# Patient Record
Sex: Male | Born: 1985 | Race: Black or African American | Hispanic: No | Marital: Married | Smoking: Never smoker
Health system: Southern US, Community
[De-identification: ages and names within clinical notes are randomized; demographics above are authoritative.]

## PROBLEM LIST (undated history)

## (undated) DIAGNOSIS — E785 Hyperlipidemia, unspecified: Secondary | ICD-10-CM

---

## 2021-09-03 ENCOUNTER — Emergency Department (HOSPITAL_BASED_OUTPATIENT_CLINIC_OR_DEPARTMENT_OTHER): Payer: BC Managed Care – PPO

## 2021-09-03 ENCOUNTER — Emergency Department (HOSPITAL_BASED_OUTPATIENT_CLINIC_OR_DEPARTMENT_OTHER)
Admission: EM | Admit: 2021-09-03 | Discharge: 2021-09-03 | Disposition: A | Discharge status: Home / Self Care | Payer: BC Managed Care – PPO | Source: Home / Self Care | Attending: Emergency Medicine | Admitting: Emergency Medicine

## 2021-09-03 ENCOUNTER — Other Ambulatory Visit: Payer: Self-pay

## 2021-09-03 ENCOUNTER — Encounter (HOSPITAL_BASED_OUTPATIENT_CLINIC_OR_DEPARTMENT_OTHER): Payer: Self-pay | Admitting: Emergency Medicine

## 2021-09-03 ENCOUNTER — Encounter (HOSPITAL_BASED_OUTPATIENT_CLINIC_OR_DEPARTMENT_OTHER): Payer: Self-pay | Admitting: *Deleted

## 2021-09-03 DIAGNOSIS — Z20822 Contact with and (suspected) exposure to covid-19: Secondary | ICD-10-CM | POA: Insufficient documentation

## 2021-09-03 DIAGNOSIS — M546 Pain in thoracic spine: Secondary | ICD-10-CM | POA: Insufficient documentation

## 2021-09-03 DIAGNOSIS — R0602 Shortness of breath: Secondary | ICD-10-CM | POA: Insufficient documentation

## 2021-09-03 DIAGNOSIS — R0781 Pleurodynia: Secondary | ICD-10-CM | POA: Insufficient documentation

## 2021-09-03 DIAGNOSIS — R0789 Other chest pain: Secondary | ICD-10-CM | POA: Insufficient documentation

## 2021-09-03 DIAGNOSIS — R109 Unspecified abdominal pain: Secondary | ICD-10-CM | POA: Insufficient documentation

## 2021-09-03 DIAGNOSIS — S24152A Other incomplete lesion at T2-T6 level of thoracic spinal cord, initial encounter: Secondary | ICD-10-CM | POA: Diagnosis not present

## 2021-09-03 DIAGNOSIS — R079 Chest pain, unspecified: Secondary | ICD-10-CM | POA: Insufficient documentation

## 2021-09-03 LAB — BASIC METABOLIC PANEL
Anion gap: 7 (ref 5–15)
Anion gap: 10 (ref 5–15)
BUN: 14 mg/dL (ref 6–20)
BUN: 14 mg/dL (ref 6–20)
CO2: 27 mmol/L (ref 22–32)
CO2: 25 mmol/L (ref 22–32)
Calcium: 9.3 mg/dL (ref 8.9–10.3)
Calcium: 9.4 mg/dL (ref 8.9–10.3)
Chloride: 105 mmol/L (ref 98–111)
Chloride: 103 mmol/L (ref 98–111)
Creatinine, Ser: 1.13 mg/dL (ref 0.61–1.24)
Creatinine, Ser: 1.17 mg/dL (ref 0.61–1.24)
GFR, Estimated: >60 mL/min (ref >60)
GFR, Estimated: >60 mL/min (ref >60)
Glucose, Bld: 105 mg/dL — ABNORMAL HIGH (ref 70–99)
Glucose, Bld: 104 mg/dL — ABNORMAL HIGH (ref 70–99)
Potassium: 3.8 mmol/L (ref 3.5–5.1)
Potassium: 4 mmol/L (ref 3.5–5.1)
Sodium: 139 mmol/L (ref 135–145)
Sodium: 138 mmol/L (ref 135–145)

## 2021-09-03 LAB — CBC WITH DIFFERENTIAL/PLATELET
Abs Immature Granulocytes: 0.01 10*3/uL (ref 0.00–0.07)
Basophils Absolute: 0.1 10*3/uL (ref 0.0–0.1)
Basophils Relative: 1 %
Eosinophils Absolute: 0 10*3/uL (ref 0.0–0.5)
Eosinophils Relative: 0 %
HCT: 39.3 % (ref 39.0–52.0)
Hemoglobin: 12.7 g/dL — ABNORMAL LOW (ref 13.0–17.0)
Immature Granulocytes: 0 %
Lymphocytes Relative: 22 %
Lymphs Abs: 1.8 10*3/uL (ref 0.7–4.0)
MCH: 30.5 pg (ref 26.0–34.0)
MCHC: 32.3 g/dL (ref 30.0–36.0)
MCV: 94.2 fL (ref 80.0–100.0)
Monocytes Absolute: 1.1 10*3/uL — ABNORMAL HIGH (ref 0.1–1.0)
Monocytes Relative: 13 %
Neutro Abs: 5.1 10*3/uL (ref 1.7–7.7)
Neutrophils Relative %: 64 %
Platelets: 386 10*3/uL (ref 150–400)
RBC: 4.17 MIL/uL — ABNORMAL LOW (ref 4.22–5.81)
RDW: 13.2 % (ref 11.5–15.5)
WBC: 8 10*3/uL (ref 4.0–10.5)
nRBC: 0 % (ref 0.0–0.2)

## 2021-09-03 LAB — CBC
HCT: 39.6 % (ref 39.0–52.0)
Hemoglobin: 12.7 g/dL — ABNORMAL LOW (ref 13.0–17.0)
MCH: 30.3 pg (ref 26.0–34.0)
MCHC: 32.1 g/dL (ref 30.0–36.0)
MCV: 94.5 fL (ref 80.0–100.0)
Platelets: 402 10*3/uL — ABNORMAL HIGH (ref 150–400)
RBC: 4.19 MIL/uL — ABNORMAL LOW (ref 4.22–5.81)
RDW: 13.5 % (ref 11.5–15.5)
WBC: 10.7 10*3/uL — ABNORMAL HIGH (ref 4.0–10.5)
nRBC: 0 % (ref 0.0–0.2)

## 2021-09-03 LAB — BRAIN NATRIURETIC PEPTIDE: B Natriuretic Peptide: 9.1 pg/mL (ref 0.0–100.0)

## 2021-09-03 LAB — TROPONIN I (HIGH SENSITIVITY)
Troponin I (High Sensitivity): <2 ng/L (ref <18)
Troponin I (High Sensitivity): <2 ng/L (ref <18)

## 2021-09-03 LAB — RESP PANEL BY RT-PCR (FLU A&B, COVID) ARPGX2
Influenza A by PCR: NEGATIVE
Influenza B by PCR: NEGATIVE
SARS Coronavirus 2 by RT PCR: NEGATIVE

## 2021-09-03 LAB — CBG MONITORING, ED: Glucose-Capillary: 80 mg/dL (ref 70–99)

## 2021-09-03 IMAGING — DX DG CHEST 1V PORT
1 series · 1 of 1 positions shown · non-contrast
Comparison: None.

CLINICAL DATA: Pleuritic chest pain.

EXAM:
PORTABLE CHEST 1 VIEW

[chest ap]
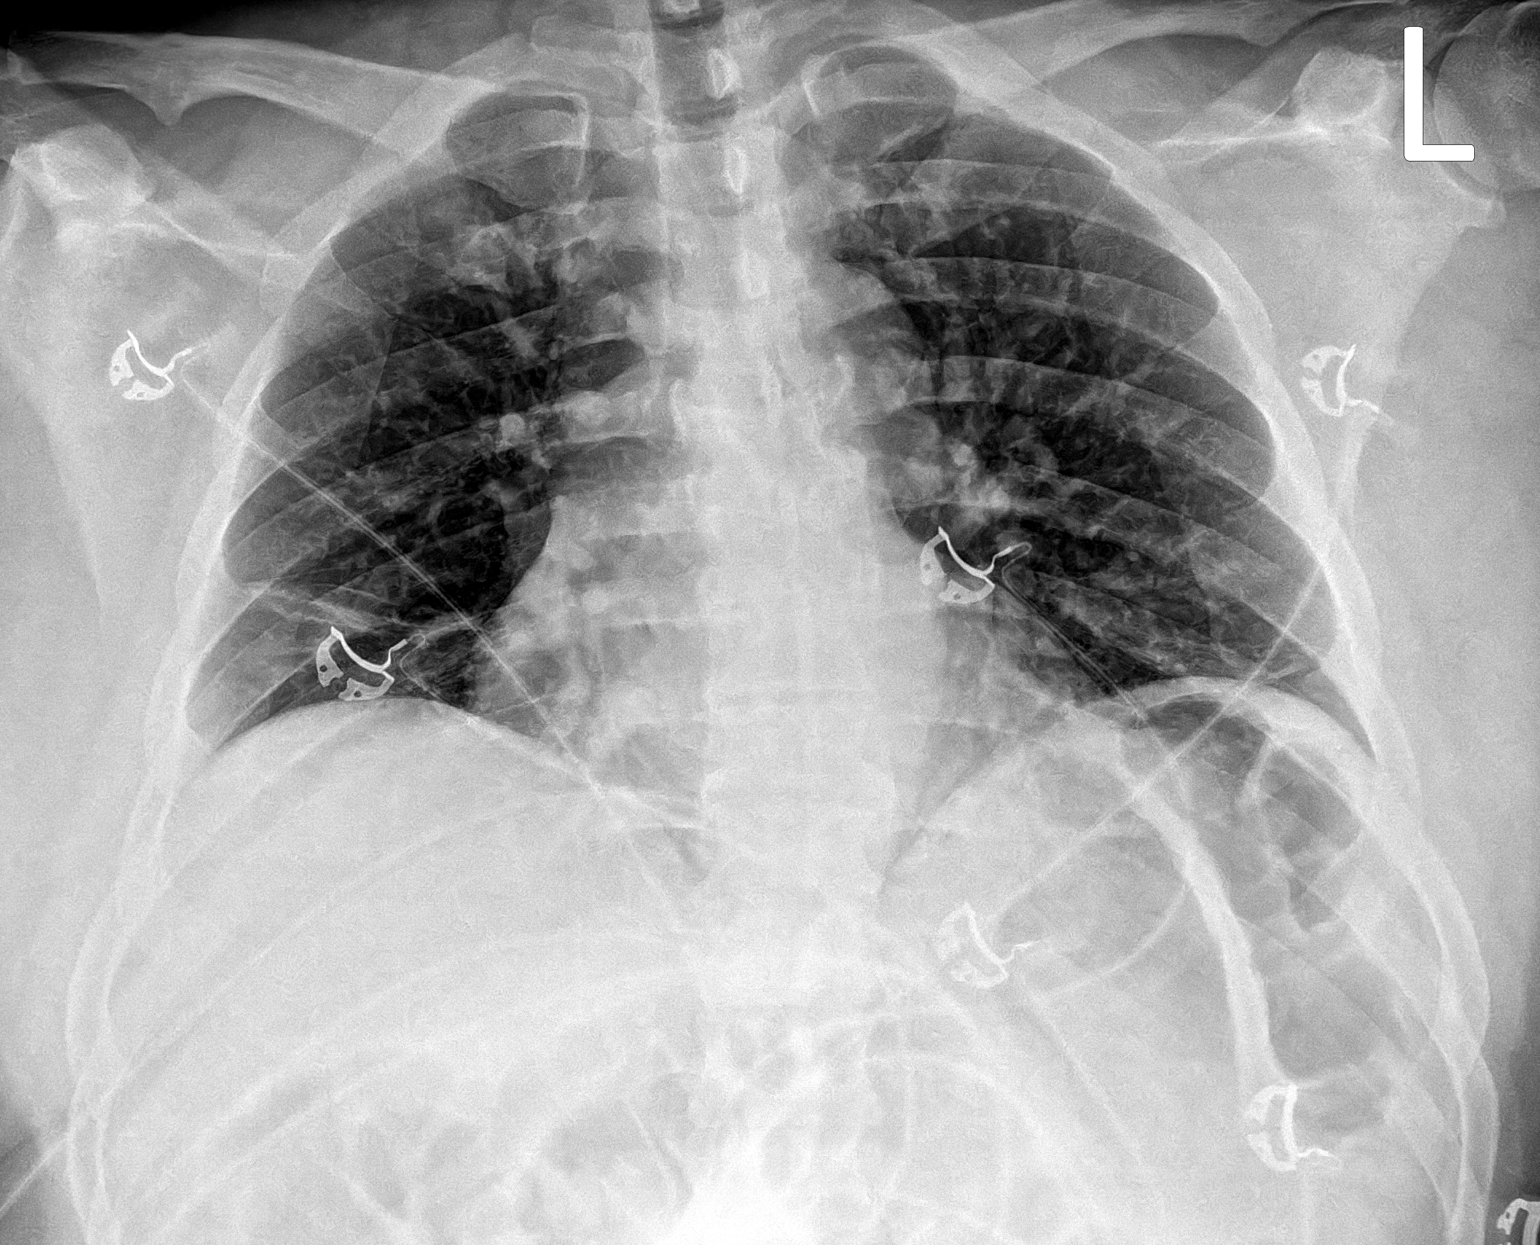

[1 of 1 positions shown; findings below may reference images not displayed]

FINDINGS: Shallow inspiration. Right lung base linear atelectasis/scarring. No
focal consolidation, pleural effusion, pneumothorax. The cardiac
silhouette is within limits. Atherosclerotic calcification of the
aorta. No acute osseous pathology.
IMPRESSION: No active disease.

## 2021-09-03 IMAGING — CT CT ANGIO CHEST
2 of 8 series · 17 of 46 positions shown · IV contrast (agent unspecified)
Comparison: Portable chest [7B] hours.

CLINICAL DATA: 36-year-old male with right side pleuritic chest
pain.

EXAM:
CT ANGIOGRAPHY CHEST WITH CONTRAST
TECHNIQUE: Multidetector CT imaging of the chest was performed using the
standard protocol during bolus administration of intravenous
contrast. Multiplanar CT image reconstructions and MIPs were
obtained to evaluate the vascular anatomy.

[Series 5: pe axial thins · axial · 0.98mm/px · z∈[+1314,+1594]mm · 14 of 324 slices shown]
[im 22/324  lung]
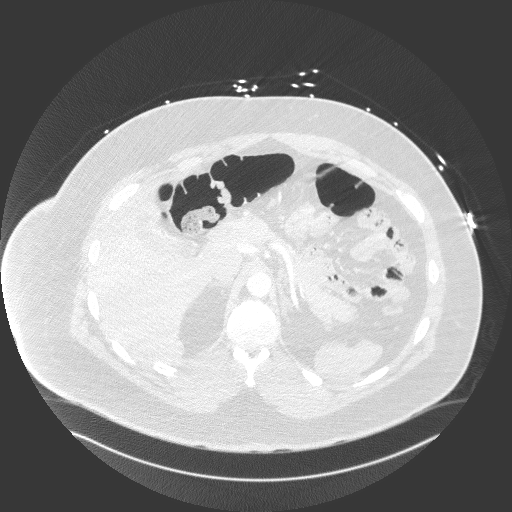
[im 44/324  soft-tissue]
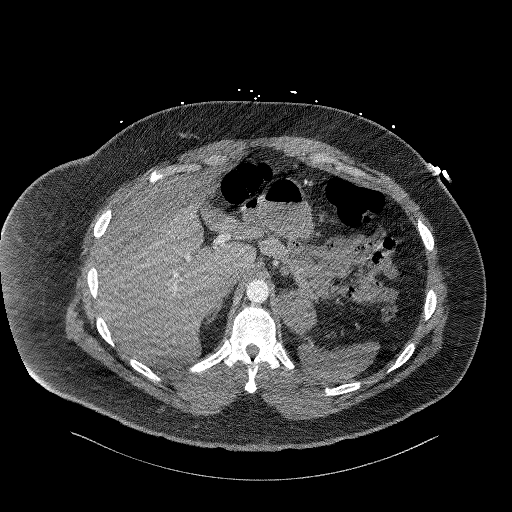
[im 65/324  lung]
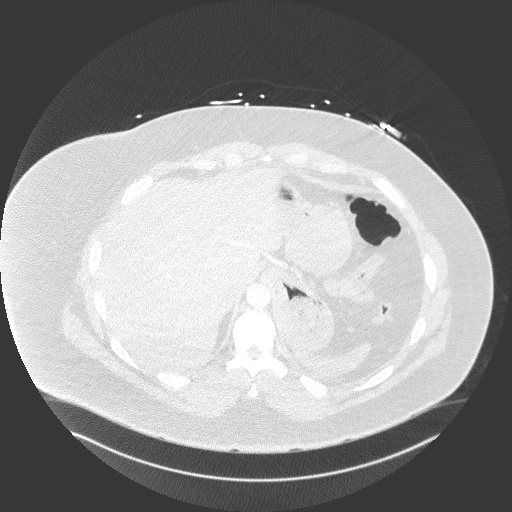
[im 87/324  soft-tissue]
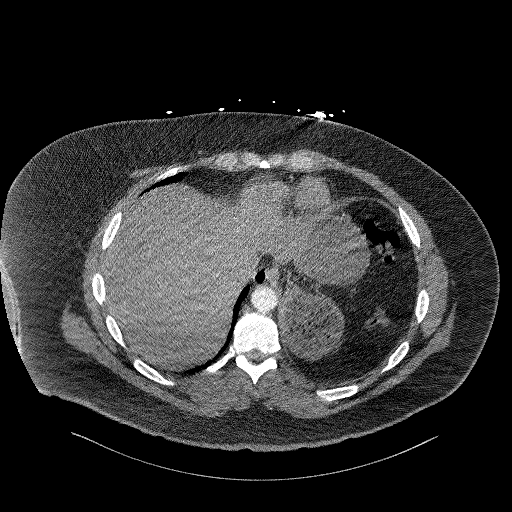
[im 108/324  lung]
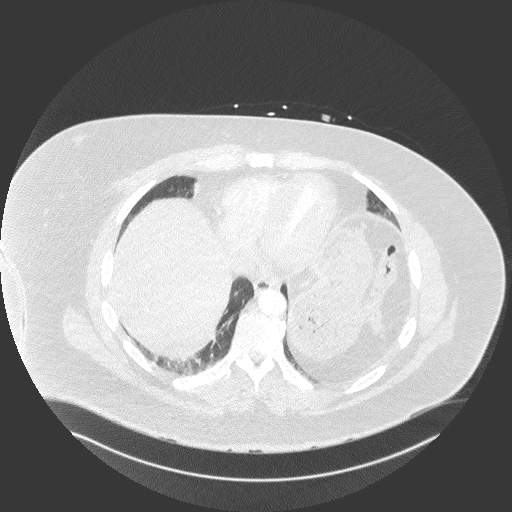
[im 130/324  soft-tissue]
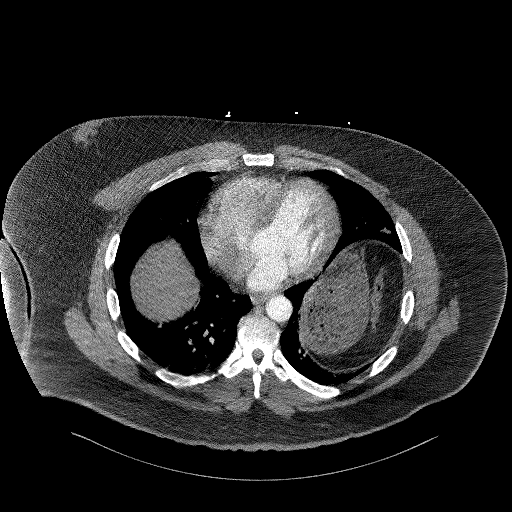
[im 151/324  lung]
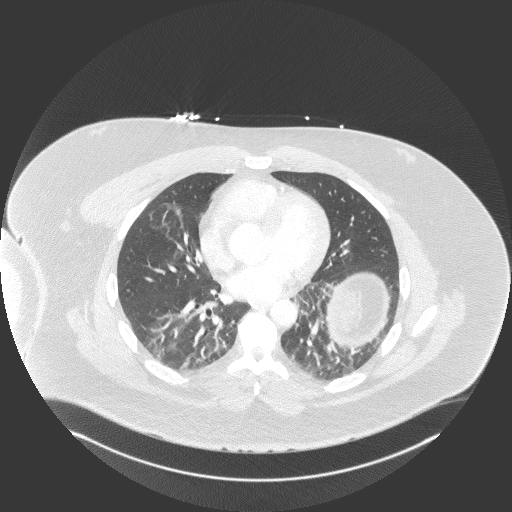
[im 173/324  soft-tissue]
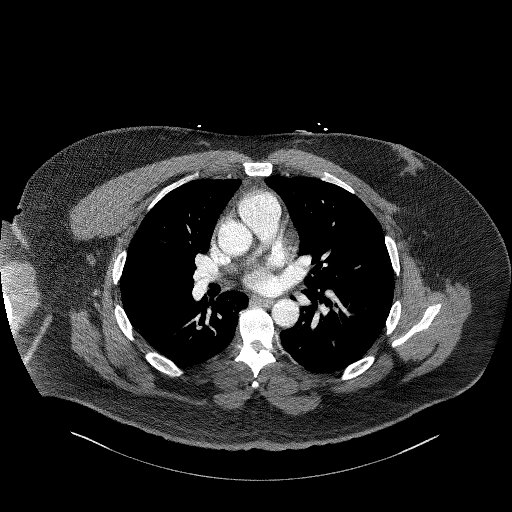
[im 194/324  lung]
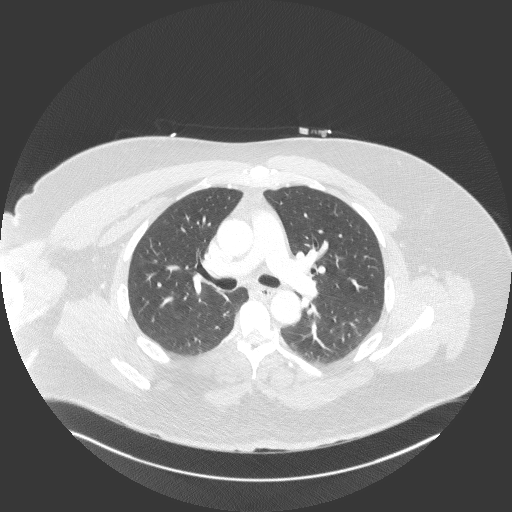
[im 216/324  soft-tissue]
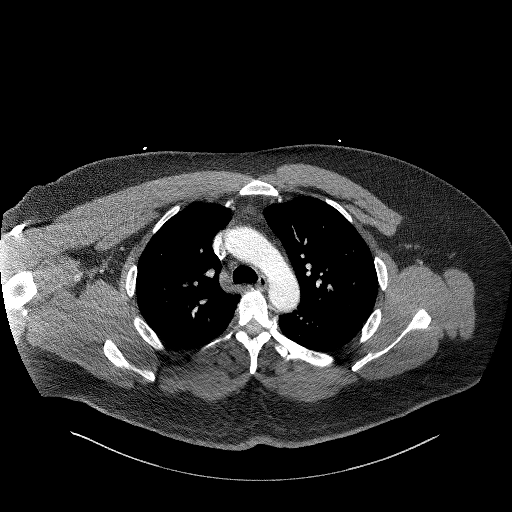
[im 237/324  lung]
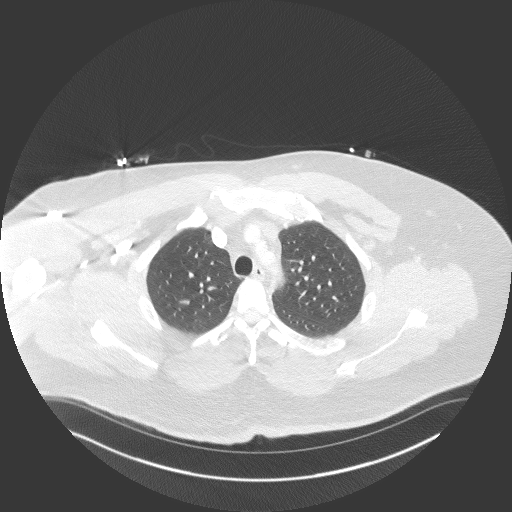
[im 259/324  soft-tissue]
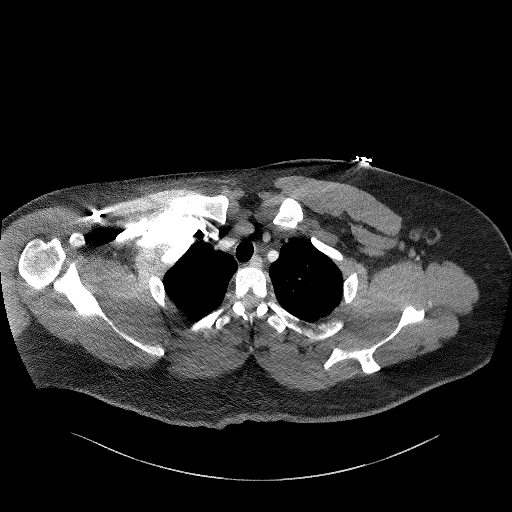
[im 280/324  lung]
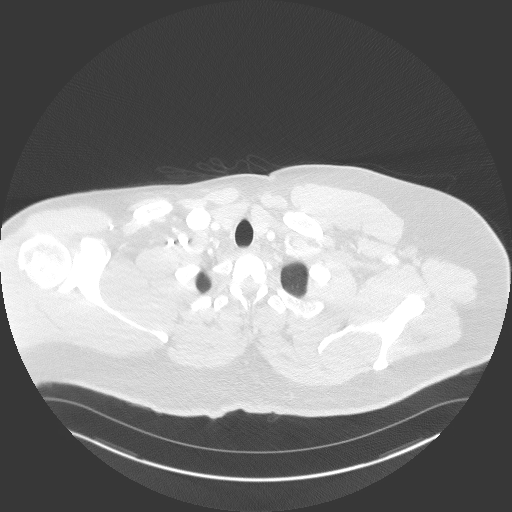
[im 302/324  soft-tissue]
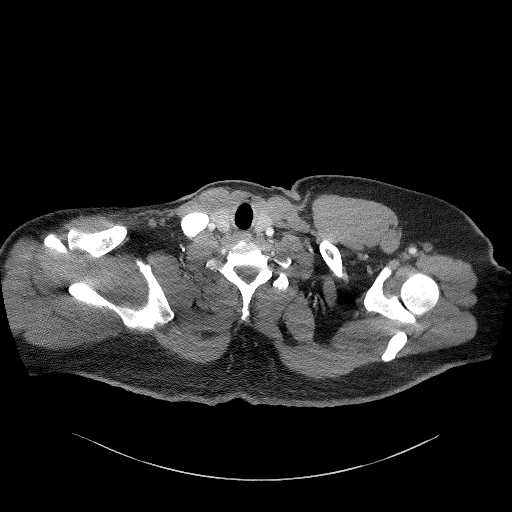

[Series 7: cor soft · coronal · 0.67mm/px · 3 of 178 slices shown]
[im 45/178  soft-tissue]
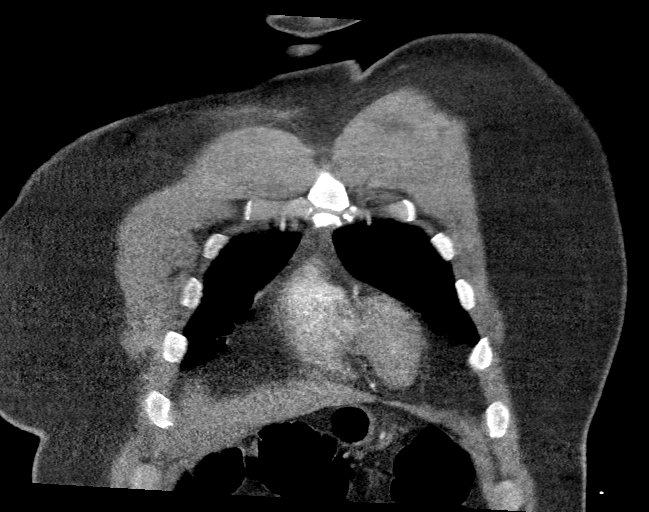
[im 89/178  soft-tissue]
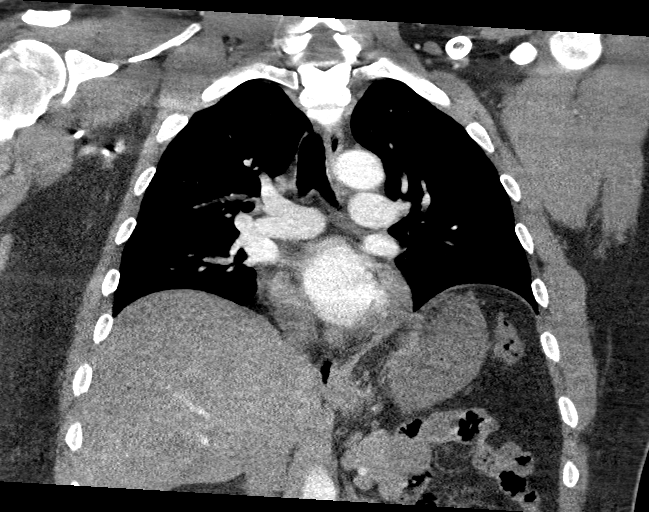
[im 133/178  soft-tissue]
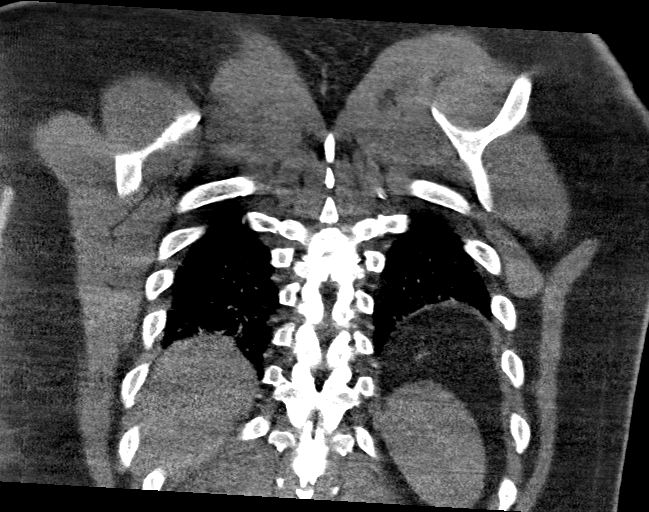

[17 of 46 positions shown; findings below may reference images not displayed]

RADIATION DOSE REDUCTION: This exam was performed according to the
departmental dose-optimization program which includes automated
exposure control, adjustment of the mA and/or kV according to
patient size and/or use of iterative reconstruction technique.

CONTRAST:  180mL OMNIPAQUE IOHEXOL 350 MG/ML SOLN
FINDINGS: Cardiovascular: Inadequate contrast bolus timing in the pulmonary
arterial tree initially. Repeat contrast bolus at [7B] hours is only
modestly improved but adequate.

Less motion artifact on the repeat images also.

No focal filling defect identified in the pulmonary arteries to
suggest acute pulmonary embolism.

No cardiomegaly or pericardial effusion. No calcified coronary
artery atherosclerosis is evident. Negative visible aorta.

Mediastinum/Nodes: Negative. No mediastinal mass or lymphadenopathy.

Lungs/Pleura: Low lung volumes with streaky bilateral middle lobe
and lower lobe opacity most resembling atelectasis. Major airways
are patent. No pleural effusion, consolidation, or convincing
pulmonary inflammation.

Upper Abdomen: Negative visible liver, gallbladder, spleen,
pancreas, adrenal glands, kidneys, and bowel in the upper abdomen.

Musculoskeletal: Negative.

Review of the MIP images confirms the above findings.
IMPRESSION: 1. Negative for acute pulmonary embolus.
2. Low lung volumes with streaky bilateral middle and lower lobe
opacity most resembling atelectasis.

## 2021-09-03 IMAGING — CT CT ANGIO CHEST
2 of 8 series · 17 of 46 positions shown · IV contrast (agent unspecified)
Comparison: Portable chest [7B] hours.

CLINICAL DATA: 36-year-old male with right side pleuritic chest
pain.

EXAM:
CT ANGIOGRAPHY CHEST WITH CONTRAST
TECHNIQUE: Multidetector CT imaging of the chest was performed using the
standard protocol during bolus administration of intravenous
contrast. Multiplanar CT image reconstructions and MIPs were
obtained to evaluate the vascular anatomy.

[Series 5: pe axial thins · axial · 0.98mm/px · z∈[+1258,+1528]mm · 14 of 312 slices shown]
[im 21/312  lung]
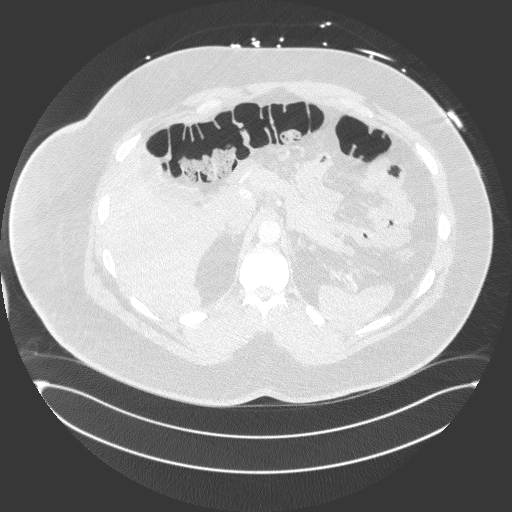
[im 42/312  soft-tissue]
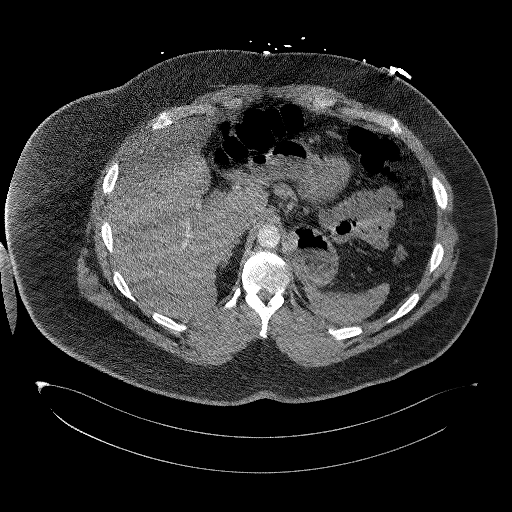
[im 63/312  lung]
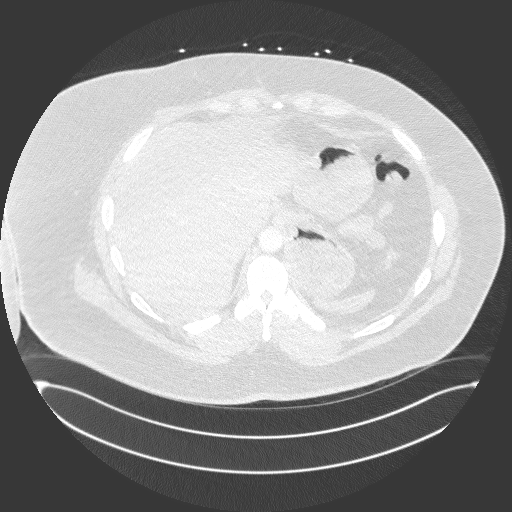
[im 83/312  soft-tissue]
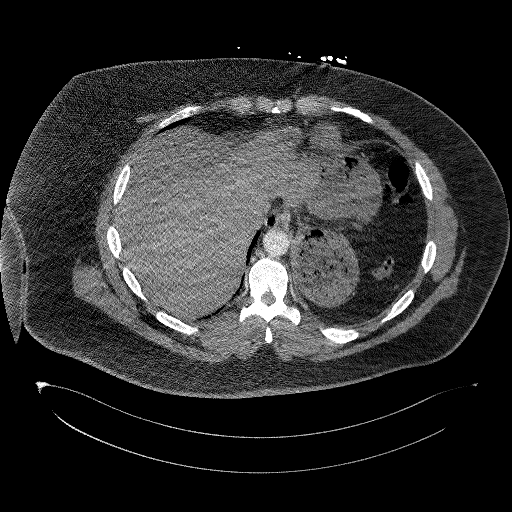
[im 104/312  lung]
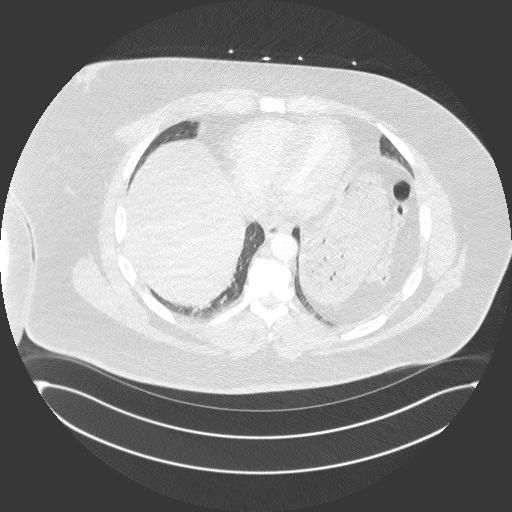
[im 125/312  soft-tissue]
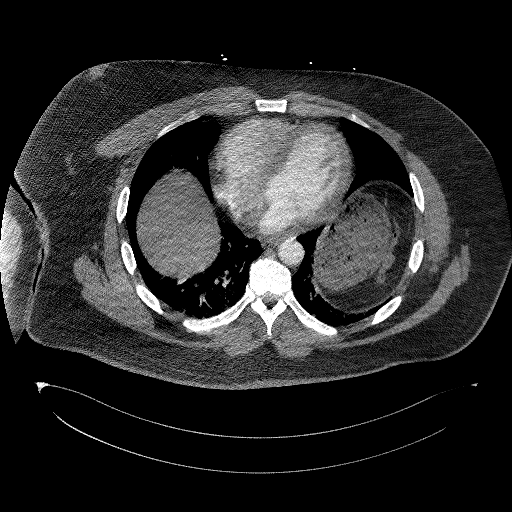
[im 146/312  lung]
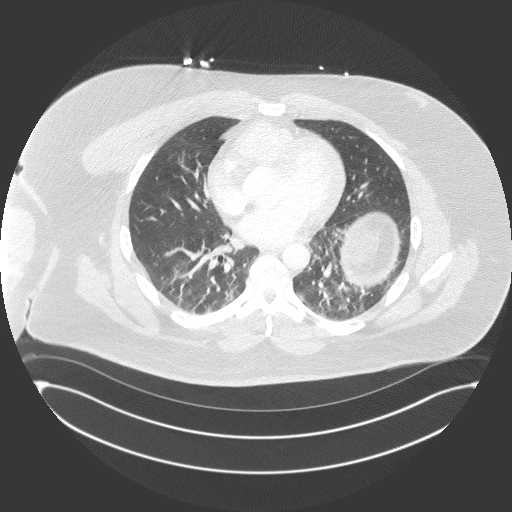
[im 166/312  soft-tissue]
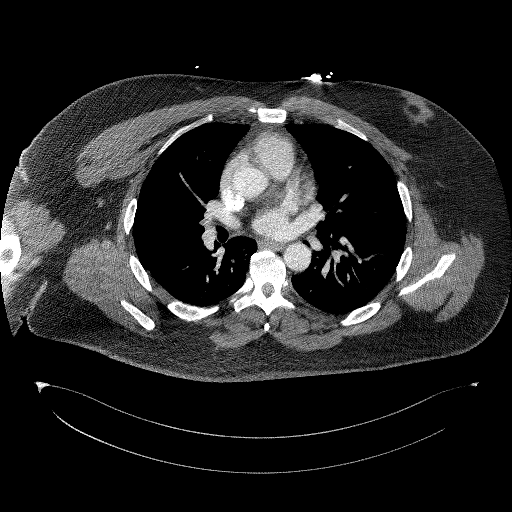
[im 187/312  lung]
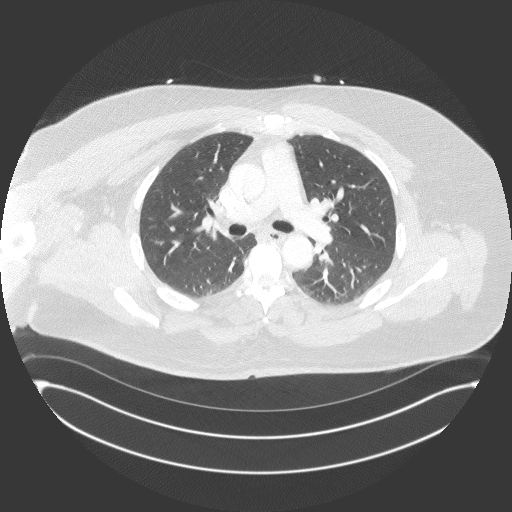
[im 208/312  soft-tissue]
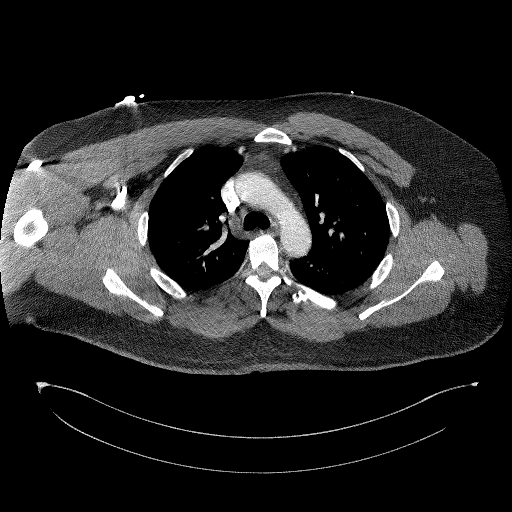
[im 229/312  lung]
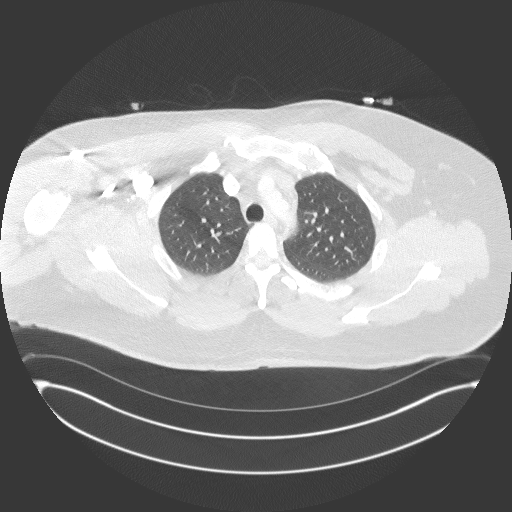
[im 249/312  soft-tissue]
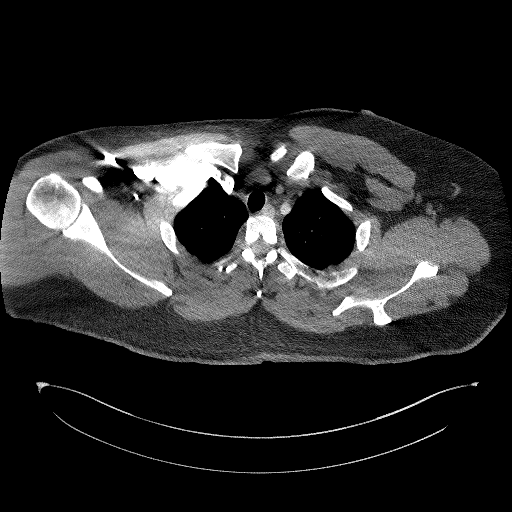
[im 270/312  lung]
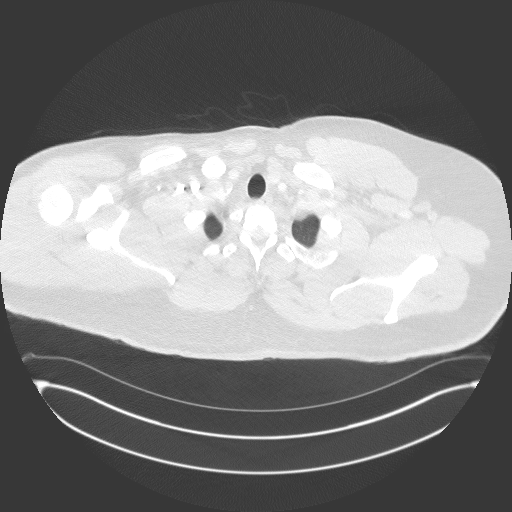
[im 291/312  soft-tissue]
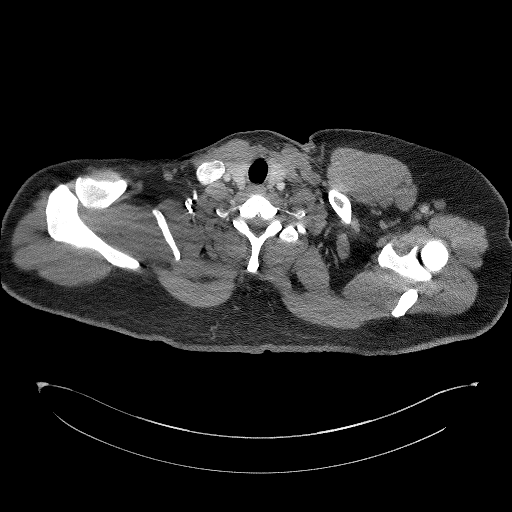

[Series 7: cor soft · coronal · 0.65mm/px · 3 of 182 slices shown]
[im 46/182  soft-tissue]
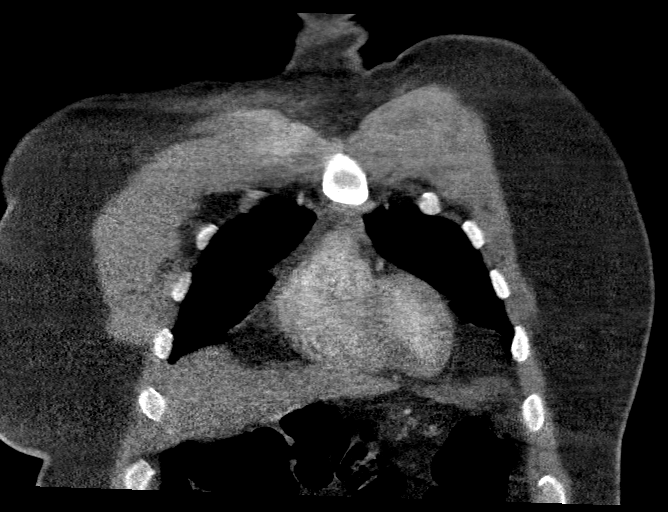
[im 91/182  soft-tissue]
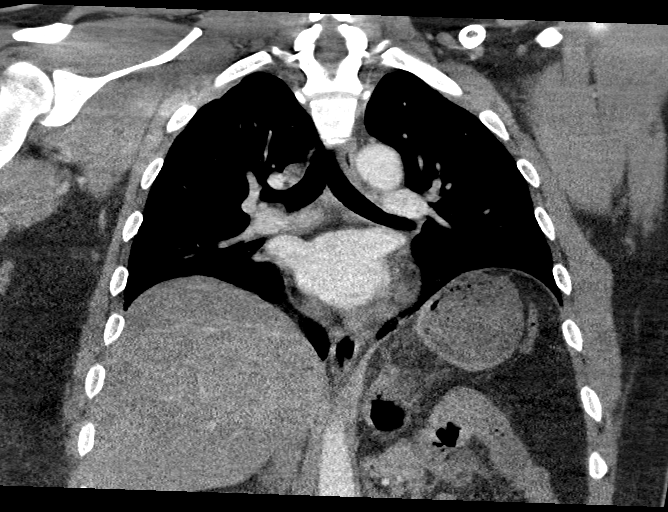
[im 136/182  soft-tissue]
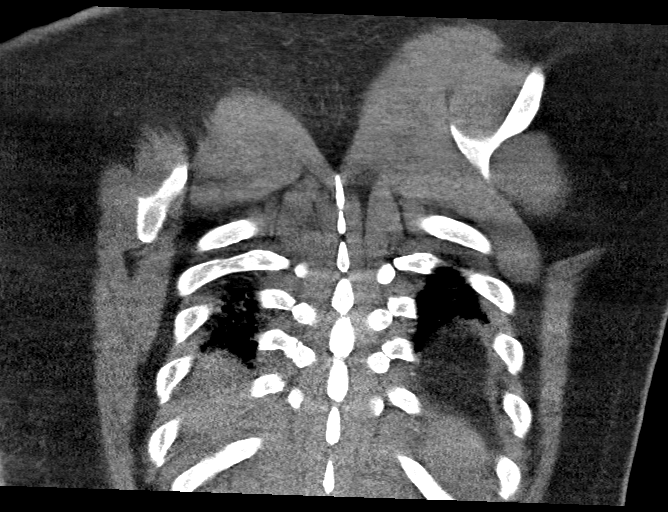

[17 of 46 positions shown; findings below may reference images not displayed]

RADIATION DOSE REDUCTION: This exam was performed according to the
departmental dose-optimization program which includes automated
exposure control, adjustment of the mA and/or kV according to
patient size and/or use of iterative reconstruction technique.

CONTRAST:  180mL OMNIPAQUE IOHEXOL 350 MG/ML SOLN
FINDINGS: Cardiovascular: Inadequate contrast bolus timing in the pulmonary
arterial tree initially. Repeat contrast bolus at [7B] hours is only
modestly improved but adequate.

Less motion artifact on the repeat images also.

No focal filling defect identified in the pulmonary arteries to
suggest acute pulmonary embolism.

No cardiomegaly or pericardial effusion. No calcified coronary
artery atherosclerosis is evident. Negative visible aorta.

Mediastinum/Nodes: Negative. No mediastinal mass or lymphadenopathy.

Lungs/Pleura: Low lung volumes with streaky bilateral middle lobe
and lower lobe opacity most resembling atelectasis. Major airways
are patent. No pleural effusion, consolidation, or convincing
pulmonary inflammation.

Upper Abdomen: Negative visible liver, gallbladder, spleen,
pancreas, adrenal glands, kidneys, and bowel in the upper abdomen.

Musculoskeletal: Negative.

Review of the MIP images confirms the above findings.
IMPRESSION: 1. Negative for acute pulmonary embolus.
2. Low lung volumes with streaky bilateral middle and lower lobe
opacity most resembling atelectasis.

## 2021-09-03 MED ORDER — HYDROXYZINE HCL 25 MG PO TABS: 25.0000 mg | ORAL_TABLET | Freq: Four times a day (QID) | ORAL | 0 refills | Status: DC

## 2021-09-03 MED ORDER — KETOROLAC TROMETHAMINE 30 MG/ML IJ SOLN
30.0000 mg | Freq: Once | INTRAMUSCULAR | Status: AC
  Administered 2021-09-03: 30 mg via INTRAVENOUS
  Filled 2021-09-03: qty 1

## 2021-09-03 MED ORDER — FENTANYL CITRATE PF 50 MCG/ML IJ SOSY
50.0000 ug | PREFILLED_SYRINGE | Freq: Once | INTRAMUSCULAR | Status: AC
  Administered 2021-09-03: 50 ug via INTRAVENOUS
  Filled 2021-09-03: qty 1

## 2021-09-03 MED ORDER — NAPROXEN 500 MG PO TABS: 500.0000 mg | ORAL_TABLET | Freq: Two times a day (BID) | ORAL | 0 refills | Status: DC

## 2021-09-03 MED ORDER — SODIUM CHLORIDE 0.9 % IV BOLUS
1000.0000 mL | Freq: Once | INTRAVENOUS | Status: AC
  Administered 2021-09-03: 1000 mL via INTRAVENOUS

## 2021-09-03 MED ORDER — METHOCARBAMOL 500 MG PO TABS: 500.0000 mg | ORAL_TABLET | Freq: Two times a day (BID) | ORAL | 0 refills | Status: DC

## 2021-09-03 MED ORDER — IOHEXOL 350 MG/ML SOLN
100.0000 mL | Freq: Once | INTRAVENOUS | Status: AC | PRN
  Administered 2021-09-03: 100 mL via INTRAVENOUS

## 2021-09-03 NOTE — Discharge Instructions (Addendum)
Your work-up today has been quite reassuring.  As we discussed, your earlier work-up was quite extensive.  As we discussed, your symptoms may be related to anxiety although there are many other diagnoses that could be causing your symptoms many of the dangerous diseases have already been ruled out effectively.  Please follow-up with your primary care doctor either tomorrow or Monday.  Return to the ER in the meantime if you have any new or concerning symptoms.  I recommend using hydroxyzine as prescribed you may take this up to 3 times daily and recommend taking deep breaths and relaxing if you have an additional episode of the symptoms however you may always return to the ER for reevaluation. ?

## 2021-09-03 NOTE — ED Triage Notes (Signed)
Pt discharged eariler today. At approx 1730 started experiencing SOB, bi-lateral flank pain under each pectoral, 8 out 10). Pt's wife states pt is lethargic and slow to answer questions ?

## 2021-09-03 NOTE — ED Provider Notes (Signed)
? ?[facility]  ?Provider Note ? ?CSN: [id number] ?Arrival date & time: 09/03/21 0230 ? ?History ?Chief Complaint  ?Patient presents with  ? Back Pain  ? ? ?Cristian Grant is a 36 y.o. male with no significant PMH reports pleuritic R upper back and lateral chest pain, gradually worsening over several hours yesterday afternoon, worse with deep breath and cough. No fever. No CP, some SOB. No nausea, vomiting or diarrhea. He was doing some lifting yesterday but did not have pain while doing that. He denies any falls or injury.  ? ? ?Home Medications ?Prior to Admission medications   ?Medication Sig Start Date End Date Taking? Authorizing Provider  ?methocarbamol (ROBAXIN) 500 MG tablet Take 1 tablet (500 mg total) by mouth 2 (two) times daily. 09/03/21  Yes Pollyann Savoy, MD  ?naproxen (NAPROSYN) 500 MG tablet Take 1 tablet (500 mg total) by mouth 2 (two) times daily. 09/03/21  Yes Pollyann Savoy, MD  ? ? ? ?Allergies    ?Patient has no known allergies. ? ? ?Review of Systems   ?Review of Systems ?Please see HPI for pertinent positives and negatives ? ?Physical Exam ?BP 136/80   Pulse 92   Temp 98.8 ?F (37.1 ?C) (Oral)   Resp 20   Ht 6\' 2"  (1.88 m)   Wt 136.1 kg   SpO2 94%   BMI 38.52 kg/m?  ? ?Physical Exam ?Vitals and nursing note reviewed.  ?Constitutional:   ?   Appearance: Normal appearance.  ?HENT:  ?   Head: Normocephalic and atraumatic.  ?   Nose: Nose normal.  ?   Mouth/Throat:  ?   Mouth: Mucous membranes are moist.  ?Eyes:  ?   Extraocular Movements: Extraocular movements intact.  ?   Conjunctiva/sclera: Conjunctivae normal.  ?Cardiovascular:  ?   Rate and Rhythm: Normal rate.  ?Pulmonary:  ?   Effort: No respiratory distress.  ?   Breath sounds: Rales (R base) present.  ?   Comments: Decreased inspiratory force due to pain ?Abdominal:  ?   General: Abdomen is flat.  ?   Palpations: Abdomen is soft.  ?   Tenderness: There is no abdominal tenderness.   ?Musculoskeletal:     ?   General: Tenderness (R thoracic back and lateral chest wall pain) present. No swelling. Normal range of motion.  ?   Cervical back: Neck supple.  ?   Right lower leg: No edema.  ?   Left lower leg: No edema.  ?Skin: ?   General: Skin is warm and dry.  ?Neurological:  ?   General: No focal deficit present.  ?   Mental Status: He is alert.  ?Psychiatric:     ?   Mood and Affect: Mood normal.  ? ? ?ED Results / Procedures / Treatments   ?EKG ?EKG Interpretation ? ?Date/Time:  Thursday September 03 2021 03:05:59 EDT ?Ventricular Rate:  96 ?PR Interval:  119 ?QRS Duration: 93 ?QT Interval:  336 ?QTC Calculation: 425 ?R Axis:   33 ?Text Interpretation: Sinus rhythm Borderline short PR interval Abnormal R-wave progression, early transition Left ventricular hypertrophy Inferior infarct, age indeterminate [id number] pattern No old tracing to compare Confirmed by [id number] [phone]) on 09/03/2021 3:14:26 AM ? ?Procedures ?Procedures ? ?Medications Ordered in the ED ?Medications  ?fentaNYL (SUBLIMAZE) injection 50 mcg (50 mcg Intravenous Given 09/03/21 0315)  ?ketorolac (TORADOL) 30 MG/ML injection 30 mg (30 mg Intravenous Given 09/03/21 0313)  ?iohexol (OMNIPAQUE) 350 MG/ML injection 100  mL (100 mLs Intravenous Contrast Given 09/03/21 0420)  ? ? ?Initial Impression and Plan ? Patient here for severe pleuritic right back and chest wall pain. Some rales on exam raising concern for infectious source. Noted to be mildly hypoxic at rest with shallow respirations, improved with deep breaths. Will check labs, CXR, pain meds for comfort.  ? ?ED Course  ? ?Clinical Course as of 09/03/21 0540  ?Thu Sep 03, 2021  ?0314 CBC with mild anemia, no old to compare.  [CS]  ?4888 I personally viewed the images from radiology studies and agree with radiologist interpretation: CXR without obvious source of pain. Will send for CTA. Patient reports some improvement after meds.  ? [CS]  ?0345 BMP is unremarkable.  [CS]  ?0348 BNP  is normal.  [CS]  ?9169 Trop is normal.  [CS]  ?0405 Covid is neg.  [CS]  ?4503 I personally viewed the images from radiology studies and agree with radiologist interpretation: CT neg for PE or pneumonia. There is atelectasis likely due to shallow respiration.  ? [CS]  ?8882 Patient continues to feel better. Plan discharge with Rx for Naprosyn, Robaxin for pleuritic pain. Recommend PCP follow up and RTED for any other concerns.  [CS]  ?  ?Clinical Course User Index ?[CS] Pollyann Savoy, MD  ? ? ? ?MDM Rules/Calculators/A&P ?Medical Decision Making ?Given initial presentation, I considered that admission might be necessary. After review of results from ED lab and/or imaging studies, admission to the hospital is not indicated at this time.  ? ? ?Problems Addressed: ?Pleuritic pain: acute illness or injury ? ?Amount and/or Complexity of Data Reviewed ?Labs: ordered. Decision-making details documented in ED Course. ?Radiology: ordered and independent interpretation performed. Decision-making details documented in ED Course. ?ECG/medicine tests: ordered and independent interpretation performed. Decision-making details documented in ED Course. ? ?Risk ?Prescription drug management. ?Decision regarding hospitalization. ? ? ? ?Final Clinical Impression(s) / ED Diagnoses ?Final diagnoses:  ?Pleuritic pain  ? ? ?Rx / DC Orders ?ED Discharge Orders   ? ?      Ordered  ?  naproxen (NAPROSYN) 500 MG tablet  2 times daily       ? 09/03/21 0538  ?  methocarbamol (ROBAXIN) 500 MG tablet  2 times daily       ? 09/03/21 0538  ? ?  ?  ? ?  ? ?  ?Pollyann Savoy, MD ?09/03/21 [phone] ? ?

## 2021-09-03 NOTE — ED Provider Notes (Signed)
?[facility] ?Provider Note ? ? ?CSN: [id number] ?Arrival date & time: 09/03/21  1840 ? ?  ? ?History ? ?Chief Complaint  ?Patient presents with  ? Shortness of Breath  ? ? ?Tyjuan Demetro is a 36 y.o. male. ? ? ?Shortness of Breath ? ?Patient is a 36 year old male with no pertinent past medical history presented emergency room today due to feeling chest tightness, some palpitations, bilateral flank pain and also some difficulty breathing and states that he felt lightheaded and fatigued. ? ?It seems that the symptoms came on approximately an hour after he took a muscle relaxer that he was prescribed earlier today he states he took this on an empty stomach.  He states he has been under a lot of stress with work and at home.  Earlier today he had extensive work-up.  I have pasted his H&P from prior provider below. ? ?"Daxson Reffett is a 36 y.o. male with no significant PMH reports pleuritic R upper back and lateral chest pain, gradually worsening over several hours yesterday afternoon, worse with deep breath and cough. No fever. No CP, some SOB. No nausea, vomiting or diarrhea. He was doing some lifting yesterday but did not have pain while doing that. He denies any falls or injury. " ? ? ?  ? ?Home Medications ?Prior to Admission medications   ?Medication Sig Start Date End Date Taking? Authorizing Provider  ?hydrOXYzine (ATARAX) 25 MG tablet Take 1 tablet (25 mg total) by mouth every 6 (six) hours. 09/03/21  Yes Bronson Bressman, Rodrigo Ran, PA  ?methocarbamol (ROBAXIN) 500 MG tablet Take 1 tablet (500 mg total) by mouth 2 (two) times daily. 09/03/21   Pollyann Savoy, MD  ?naproxen (NAPROSYN) 500 MG tablet Take 1 tablet (500 mg total) by mouth 2 (two) times daily. 09/03/21   Pollyann Savoy, MD  ?   ? ?Allergies    ?Patient has no known allergies.   ? ?Review of Systems   ?Review of Systems  ?Respiratory:  Positive for shortness of breath.   ? ?Physical Exam ?Updated Vital  Signs ?BP 124/84 (BP Location: Right Arm)   Pulse 92   Temp 98.6 ?F (37 ?C) (Oral)   Resp 18   Ht 6\' 2"  (1.88 m)   Wt (!) 149.7 kg   SpO2 98%   BMI 42.37 kg/m?  ?Physical Exam ?Vitals and nursing note reviewed.  ?Constitutional:   ?   General: He is not in acute distress. ?   Comments: 36 year old male appears uncomfortable but is able answer questions appropriate follow commands is nontoxic-appearing.  ?HENT:  ?   Head: Normocephalic and atraumatic.  ?   Nose: Nose normal.  ?   Mouth/Throat:  ?   Mouth: Mucous membranes are dry.  ?Eyes:  ?   General: No scleral icterus. ?Cardiovascular:  ?   Rate and Rhythm: Normal rate and regular rhythm.  ?   Pulses: Normal pulses.  ?   Heart sounds: Normal heart sounds.  ?Pulmonary:  ?   Effort: Pulmonary effort is normal. No respiratory distress.  ?   Breath sounds: Normal breath sounds. No wheezing.  ?Abdominal:  ?   Palpations: Abdomen is soft.  ?   Tenderness: There is no abdominal tenderness. There is no guarding or rebound.  ?Musculoskeletal:  ?   Cervical back: Normal range of motion.  ?   Right lower leg: No edema.  ?   Left lower leg: No edema.  ?Skin: ?  General: Skin is warm and dry.  ?   Capillary Refill: Capillary refill takes less than 2 seconds.  ?Neurological:  ?   Mental Status: He is alert. Mental status is at baseline.  ?   Comments: Alert and oriented x3, able answer questions properly follow commands.  Bilateral grip strength 5/5  ?Psychiatric:     ?   Mood and Affect: Mood normal.     ?   Behavior: Behavior normal.  ? ? ?ED Results / Procedures / Treatments   ?Labs ?(all labs ordered are listed, but only abnormal results are displayed) ?Labs Reviewed  ?BASIC METABOLIC PANEL - Abnormal; Notable for the following components:  ?    Result Value  ? Glucose, Bld 104 (*)   ? All other components within normal limits  ?CBC - Abnormal; Notable for the following components:  ? WBC 10.7 (*)   ? RBC 4.19 (*)   ? Hemoglobin 12.7 (*)   ? Platelets 402 (*)   ?  All other components within normal limits  ?CBG MONITORING, ED  ?TROPONIN I (HIGH SENSITIVITY)  ? ? ?EKG ?None ? ?Radiology ?CT Angio Chest PE W and/or Wo Contrast ? ?Result Date: 09/03/2021 ?CLINICAL DATA:  36 year old male with right side pleuritic chest pain. EXAM: CT ANGIOGRAPHY CHEST WITH CONTRAST TECHNIQUE: Multidetector CT imaging of the chest was performed using the standard protocol during bolus administration of intravenous contrast. Multiplanar CT image reconstructions and MIPs were obtained to evaluate the vascular anatomy. RADIATION DOSE REDUCTION: This exam was performed according to the departmental dose-optimization program which includes automated exposure control, adjustment of the mA and/or kV according to patient size and/or use of iterative reconstruction technique. CONTRAST:  180mL OMNIPAQUE IOHEXOL 350 MG/ML SOLN COMPARISON:  Portable chest 0306 hours. FINDINGS: Cardiovascular: Inadequate contrast bolus timing in the pulmonary arterial tree initially. Repeat contrast bolus at 0455 hours is only modestly improved but adequate. Less motion artifact on the repeat images also. No focal filling defect identified in the pulmonary arteries to suggest acute pulmonary embolism. No cardiomegaly or pericardial effusion. No calcified coronary artery atherosclerosis is evident. Negative visible aorta. Mediastinum/Nodes: Negative. No mediastinal mass or lymphadenopathy. Lungs/Pleura: Low lung volumes with streaky bilateral middle lobe and lower lobe opacity most resembling atelectasis. Major airways are patent. No pleural effusion, consolidation, or convincing pulmonary inflammation. Upper Abdomen: Negative visible liver, gallbladder, spleen, pancreas, adrenal glands, kidneys, and bowel in the upper abdomen. Musculoskeletal: Negative. Review of the MIP images confirms the above findings. IMPRESSION: 1. Negative for acute pulmonary embolus. 2. Low lung volumes with streaky bilateral middle and lower lobe  opacity most resembling atelectasis. Electronically Signed   By: Odessa FlemingH  Hall M.D.   On: 09/03/2021 05:12  ? ?DG Chest Portable 1 View ? ?Result Date: 09/03/2021 ?CLINICAL DATA:  Chest pain and shortness of breath. EXAM: PORTABLE CHEST 1 VIEW COMPARISON:  Chest radiograph dated 09/03/2021. FINDINGS: Shallow inspiration. Bibasilar atelectasis/scarring. No focal consolidation, pleural effusion, pneumothorax. The cardiac silhouette is within normal limits. No acute osseous pathology. IMPRESSION: No active disease. Electronically Signed   By: Elgie CollardArash  Radparvar M.D.   On: 09/03/2021 19:22  ? ?DG Chest Port 1 View ? ?Result Date: 09/03/2021 ?CLINICAL DATA:  Pleuritic chest pain. EXAM: PORTABLE CHEST 1 VIEW COMPARISON:  None. FINDINGS: Shallow inspiration. Right lung base linear atelectasis/scarring. No focal consolidation, pleural effusion, pneumothorax. The cardiac silhouette is within limits. Atherosclerotic calcification of the aorta. No acute osseous pathology. IMPRESSION: No active disease. Electronically Signed   By: Burtis JunesArash  Radparvar M.D.   On: 09/03/2021 03:20   ? ?Procedures ?Procedures  ? ? ?Medications Ordered in ED ?Medications  ?sodium chloride 0.9 % bolus 1,000 mL (0 mLs Intravenous Stopped 09/03/21 2129)  ? ? ?ED Course/ Medical Decision Making/ A&P ?Clinical Course as of 09/03/21 2227  ?Thu Sep 03, 2021  ?2119 Reviewed images from chest x-ray agree with radiology read no acute disease no infiltrate or pneumothorax [WF]  ?2120 EKG nonischemic some Q waves in some of the inferior leads no S1Q3T3.  Negative CT PE study done earlier today [WF]  ?  ?Clinical Course User Index ?[WF] Gailen Shelter, [place]  ? ?                        ?Medical Decision Making ?Amount and/or Complexity of Data Reviewed ?Labs: ordered. ?Radiology: ordered. ? ?Risk ?Prescription drug management. ? ? ?This patient presents to the ED for concern of shortness of breath, bilateral flank pain, chest tightness, this involves a number of treatment  options, and is a complaint that carries with it a high risk of complications and morbidity.  A differential diagnosis is discussed below ? ?The causes for shortness of breath include but are not limited to ?Cardiac ([facility]F,

## 2021-09-03 NOTE — ED Triage Notes (Signed)
Pt c/o pain in the right upper back just below his shoulder blade radiates around to the right side rib area,  started yesterday afternoon. Tender to touch, pain worse to take a deep breath and movement. Took ibuprofen without relief. Pt says he did pick up a heavy speaker on Tuesday afternoon, unsure if this is contributing to his pain.  ?

## 2021-09-04 ENCOUNTER — Emergency Department (HOSPITAL_COMMUNITY): Payer: BC Managed Care – PPO | Admitting: Anesthesiology

## 2021-09-04 ENCOUNTER — Emergency Department (HOSPITAL_COMMUNITY): Payer: BC Managed Care – PPO

## 2021-09-04 ENCOUNTER — Encounter (HOSPITAL_COMMUNITY)
Admission: EM | Disposition: A | Discharge status: Other Acute Inpatient Hospital | Payer: Self-pay | Source: Home / Self Care | Attending: Neurological Surgery

## 2021-09-04 ENCOUNTER — Other Ambulatory Visit: Payer: Self-pay

## 2021-09-04 ENCOUNTER — Inpatient Hospital Stay (HOSPITAL_COMMUNITY)
Admission: EM | Admit: 2021-09-04 | Discharge: 2021-09-15 | DRG: 028 | Disposition: A | Discharge status: Rehabilitation Facility | Payer: BC Managed Care – PPO | Attending: Neurological Surgery | Admitting: Neurological Surgery

## 2021-09-04 ENCOUNTER — Encounter (HOSPITAL_COMMUNITY): Payer: Self-pay

## 2021-09-04 DIAGNOSIS — E876 Hypokalemia: Secondary | ICD-10-CM | POA: Diagnosis present

## 2021-09-04 DIAGNOSIS — E7800 Pure hypercholesterolemia, unspecified: Secondary | ICD-10-CM | POA: Diagnosis present

## 2021-09-04 DIAGNOSIS — K5900 Constipation, unspecified: Secondary | ICD-10-CM | POA: Diagnosis present

## 2021-09-04 DIAGNOSIS — R339 Retention of urine, unspecified: Secondary | ICD-10-CM | POA: Diagnosis not present

## 2021-09-04 DIAGNOSIS — R0683 Snoring: Secondary | ICD-10-CM | POA: Diagnosis present

## 2021-09-04 DIAGNOSIS — E871 Hypo-osmolality and hyponatremia: Secondary | ICD-10-CM | POA: Diagnosis not present

## 2021-09-04 DIAGNOSIS — M5104 Intervertebral disc disorders with myelopathy, thoracic region: Secondary | ICD-10-CM | POA: Diagnosis present

## 2021-09-04 DIAGNOSIS — M4804 Spinal stenosis, thoracic region: Secondary | ICD-10-CM | POA: Diagnosis present

## 2021-09-04 DIAGNOSIS — Z833 Family history of diabetes mellitus: Secondary | ICD-10-CM

## 2021-09-04 DIAGNOSIS — G8918 Other acute postprocedural pain: Secondary | ICD-10-CM | POA: Diagnosis not present

## 2021-09-04 DIAGNOSIS — I341 Nonrheumatic mitral (valve) prolapse: Secondary | ICD-10-CM | POA: Diagnosis not present

## 2021-09-04 DIAGNOSIS — M546 Pain in thoracic spine: Secondary | ICD-10-CM | POA: Diagnosis present

## 2021-09-04 DIAGNOSIS — Z6838 Body mass index (BMI) 38.0-38.9, adult: Secondary | ICD-10-CM

## 2021-09-04 DIAGNOSIS — G9520 Unspecified cord compression: Principal | ICD-10-CM

## 2021-09-04 DIAGNOSIS — E882 Lipomatosis, not elsewhere classified: Secondary | ICD-10-CM | POA: Diagnosis present

## 2021-09-04 DIAGNOSIS — J9811 Atelectasis: Secondary | ICD-10-CM | POA: Diagnosis present

## 2021-09-04 DIAGNOSIS — X500XXA Overexertion from strenuous movement or load, initial encounter: Secondary | ICD-10-CM | POA: Diagnosis not present

## 2021-09-04 DIAGNOSIS — Z79899 Other long term (current) drug therapy: Secondary | ICD-10-CM | POA: Diagnosis not present

## 2021-09-04 DIAGNOSIS — B9561 Methicillin susceptible Staphylococcus aureus infection as the cause of diseases classified elsewhere: Secondary | ICD-10-CM | POA: Diagnosis present

## 2021-09-04 DIAGNOSIS — S24152A Other incomplete lesion at T2-T6 level of thoracic spinal cord, initial encounter: Secondary | ICD-10-CM | POA: Diagnosis present

## 2021-09-04 DIAGNOSIS — G062 Extradural and subdural abscess, unspecified: Secondary | ICD-10-CM

## 2021-09-04 DIAGNOSIS — A4901 Methicillin susceptible Staphylococcus aureus infection, unspecified site: Secondary | ICD-10-CM

## 2021-09-04 DIAGNOSIS — R338 Other retention of urine: Secondary | ICD-10-CM | POA: Diagnosis not present

## 2021-09-04 DIAGNOSIS — Z20822 Contact with and (suspected) exposure to covid-19: Secondary | ICD-10-CM | POA: Diagnosis present

## 2021-09-04 DIAGNOSIS — N319 Neuromuscular dysfunction of bladder, unspecified: Secondary | ICD-10-CM | POA: Diagnosis not present

## 2021-09-04 DIAGNOSIS — R0989 Other specified symptoms and signs involving the circulatory and respiratory systems: Secondary | ICD-10-CM | POA: Diagnosis not present

## 2021-09-04 DIAGNOSIS — G061 Intraspinal abscess and granuloma: Secondary | ICD-10-CM | POA: Diagnosis present

## 2021-09-04 DIAGNOSIS — I38 Endocarditis, valve unspecified: Secondary | ICD-10-CM | POA: Diagnosis not present

## 2021-09-04 DIAGNOSIS — G8220 Paraplegia, unspecified: Secondary | ICD-10-CM | POA: Diagnosis present

## 2021-09-04 DIAGNOSIS — M7138 Other bursal cyst, other site: Secondary | ICD-10-CM | POA: Diagnosis present

## 2021-09-04 DIAGNOSIS — M4714 Other spondylosis with myelopathy, thoracic region: Secondary | ICD-10-CM | POA: Diagnosis not present

## 2021-09-04 HISTORY — DX: Hyperlipidemia, unspecified: E78.5

## 2021-09-04 HISTORY — PX: LUMBAR LAMINECTOMY/DECOMPRESSION MICRODISCECTOMY: SHX5026

## 2021-09-04 LAB — SEDIMENTATION RATE: Sed Rate: 44 mm/hr — ABNORMAL HIGH (ref 0–16)

## 2021-09-04 LAB — SURGICAL PCR SCREEN
MRSA, PCR: NEGATIVE
Staphylococcus aureus: NEGATIVE

## 2021-09-04 LAB — C-REACTIVE PROTEIN: CRP: 13.6 mg/dL — ABNORMAL HIGH (ref <1.0)

## 2021-09-04 LAB — MRSA NEXT GEN BY PCR, NASAL: MRSA by PCR Next Gen: NOT DETECTED

## 2021-09-04 IMAGING — RF DG THORACIC SPINE 1V
1 series · 1 of 1 positions shown · non-contrast
Comparison: None.

CLINICAL DATA: T5-T7 laminectomy. Intraoperative fluoroscopic
radiograph

EXAM:
OPERATIVE THORACIC SPINE single VIEW(S)

[Series 1: run · 1 of 1 slices shown]
[im 1/1]
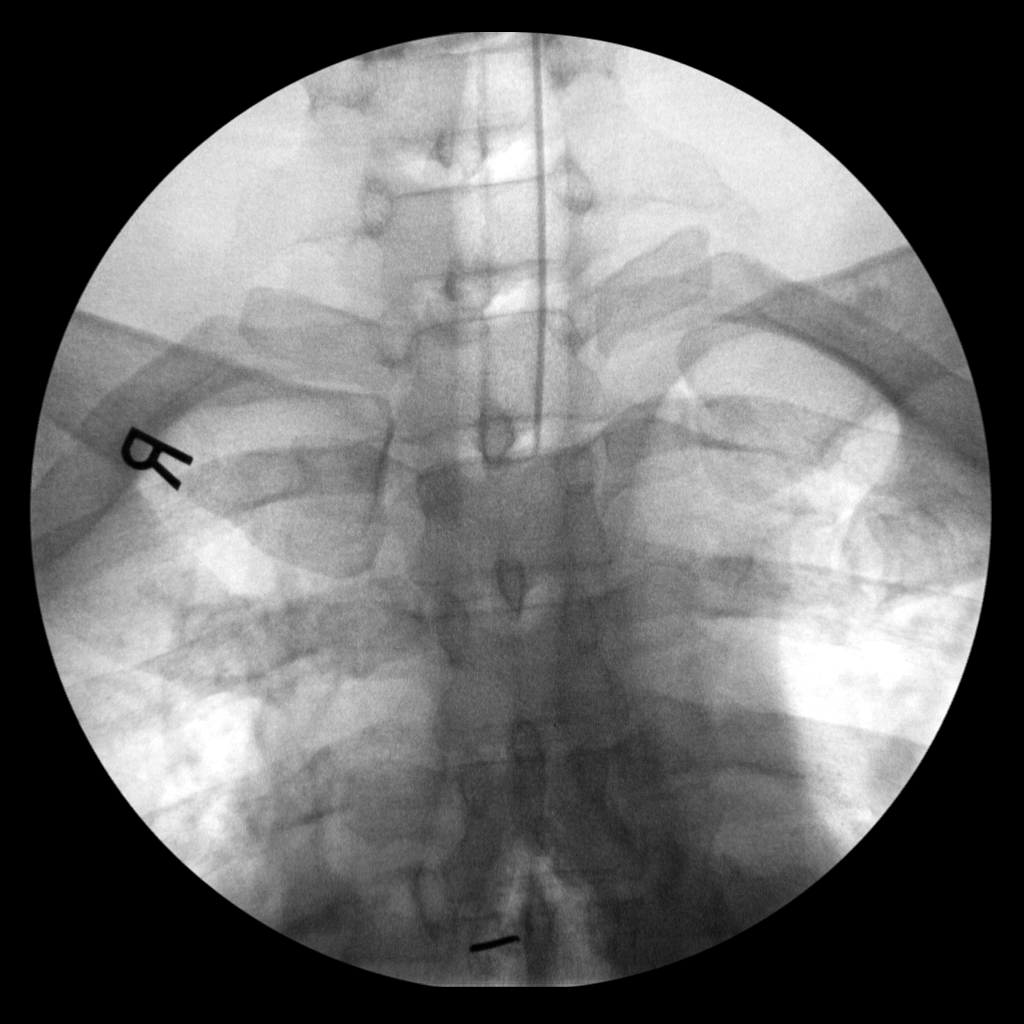

[1 of 1 positions shown; findings below may reference images not displayed]

FINDINGS: Single frontal fluoroscopic radiograph of the a thoracic spine
demonstrates a thoracic spine from T1 through the superior endplate
of T5. Surgical clip overlies the superior aspect of the T5
vertebral body the inferior margin of the examination. Endotracheal
tube is in place. Rudimentary cervical ribs are noted at C7.

Fluoroscopy time: 12 seconds

Fluoroscopic dose: 4.07 mGy

Images: 1
IMPRESSION: Intraoperative fluoroscopic radiographs as described above

## 2021-09-04 IMAGING — MR MR LUMBAR SPINE W/O CM
4 of 5 series · 28 of 48 positions shown · non-contrast
Comparison: Thoracic spine MRI today.

CLINICAL DATA: 36-year-old male with thoracolumbar back pain,
bilateral lower extremity weakness and numbness. Symptom onset after
heavy lifting.

EXAM:
MRI LUMBAR SPINE WITHOUT CONTRAST
TECHNIQUE: Multiplanar, multisequence MR imaging of the lumbar spine was
performed. No intravenous contrast was administered.

[Series 1: T2 · sagittal · 4.0mm · 0.76mm/px · 6 of 19 slices shown (1 of 2)]
[im 1/19]
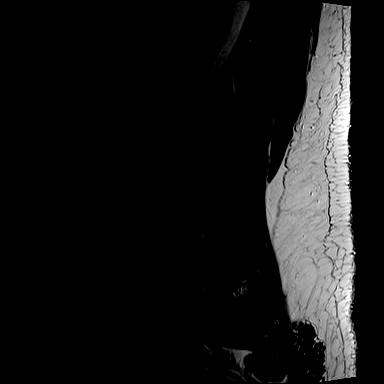
[im 4/19]
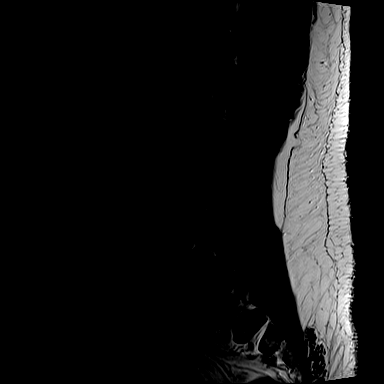
[im 8/19]
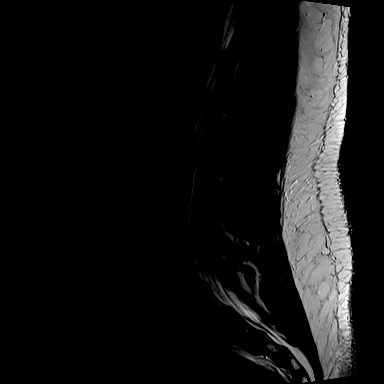
[im 11/19]
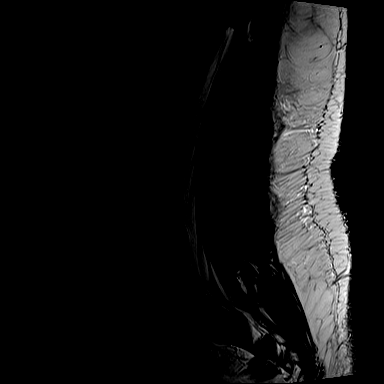
[im 15/19]
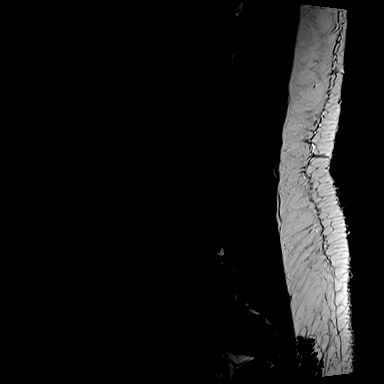
[im 19/19]
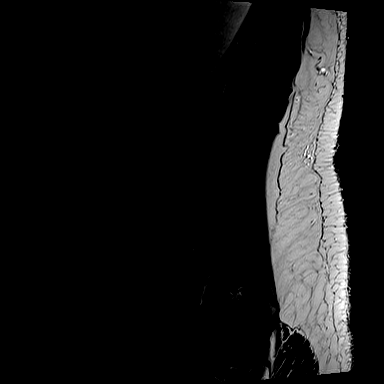

[Series 3: T1 · sagittal · 4.0mm · 0.91mm/px · 7 of 19 slices shown (1 of 2)]
[im 1/19]
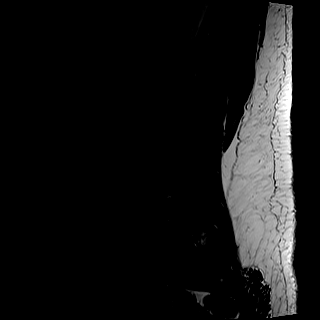
[im 4/19]
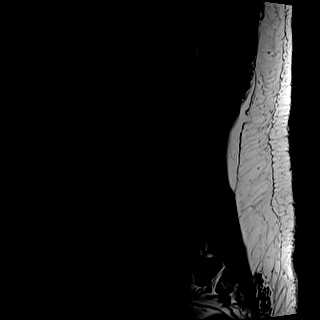
[im 7/19]
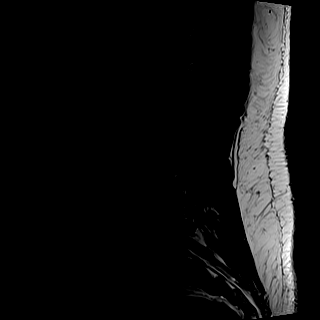
[im 10/19]
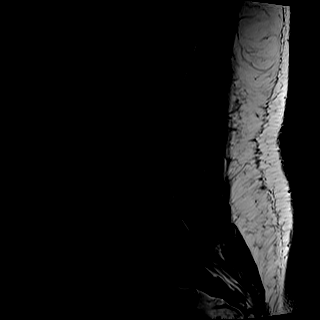
[im 13/19]
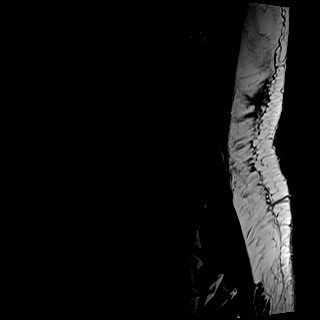
[im 16/19]
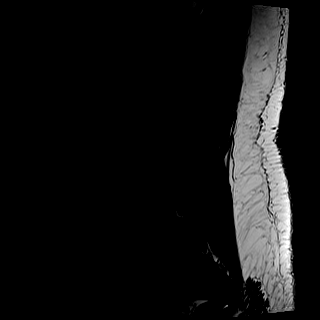
[im 19/19]
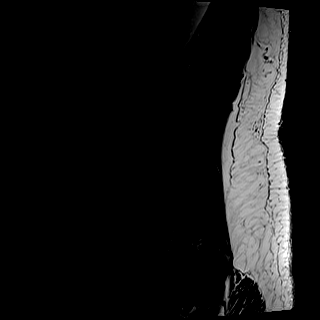

[Series 4: T2 · axial · 5.0mm · 0.65mm/px · z∈[-519,-242]mm · 8 of 40 slices shown (2 of 2)]
[im 1/40]
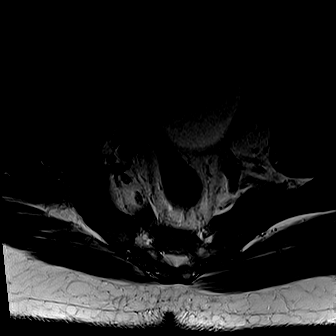
[im 7/40]
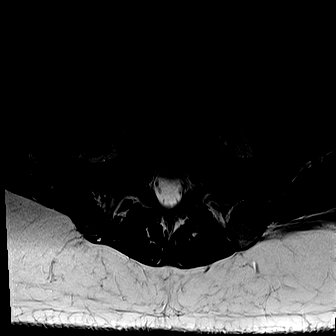
[im 13/40]
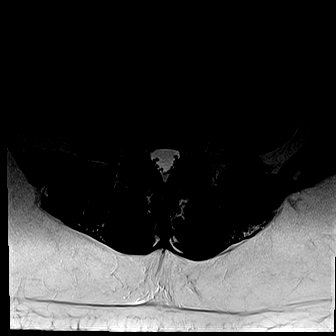
[im 19/40]
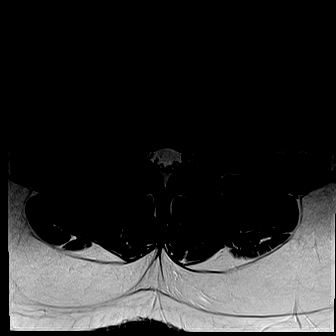
[im 22/40]
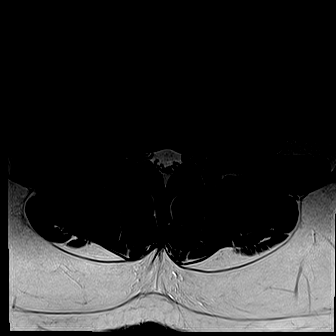
[im 28/40]
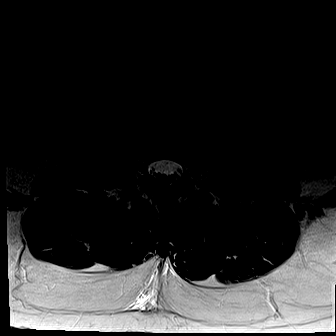
[im 34/40]
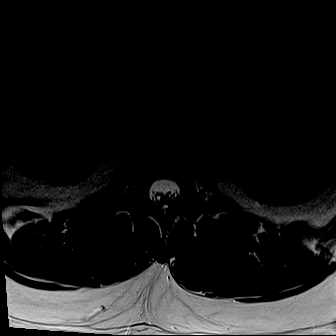
[im 40/40]
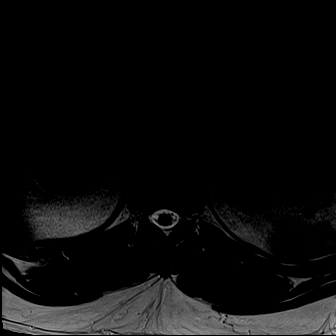

[Series 5: T1 · axial · 5.0mm · 0.36mm/px · z∈[-519,-287]mm · 7 of 40 slices shown (2 of 2)]
[im 1/40]
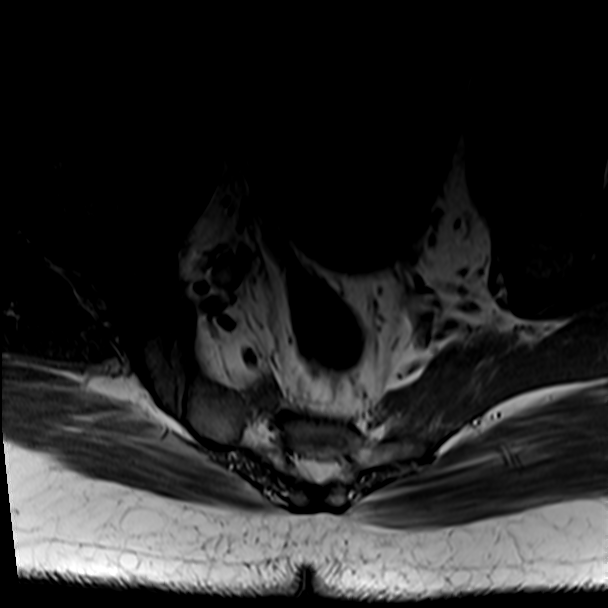
[im 7/40]
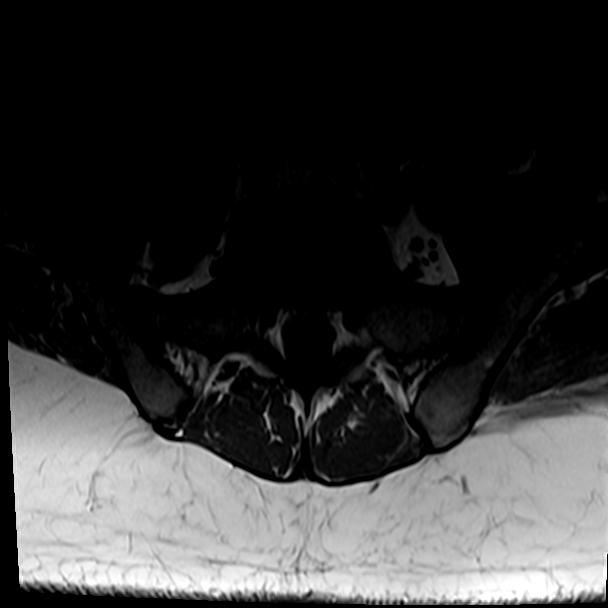
[im 13/40]
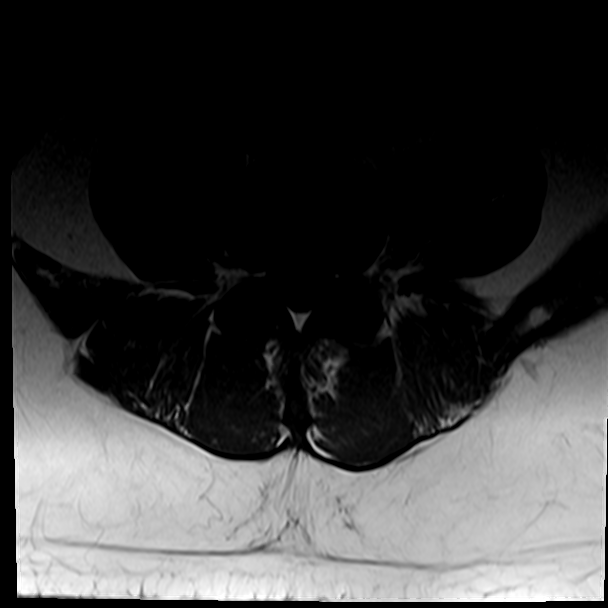
[im 19/40]
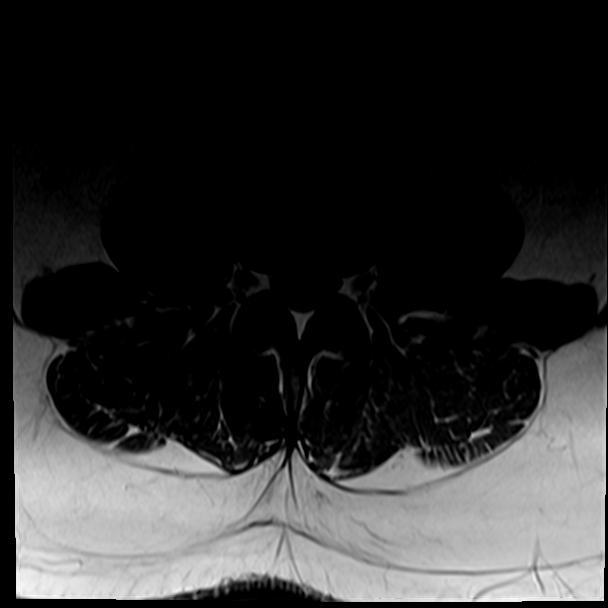
[im 22/40]
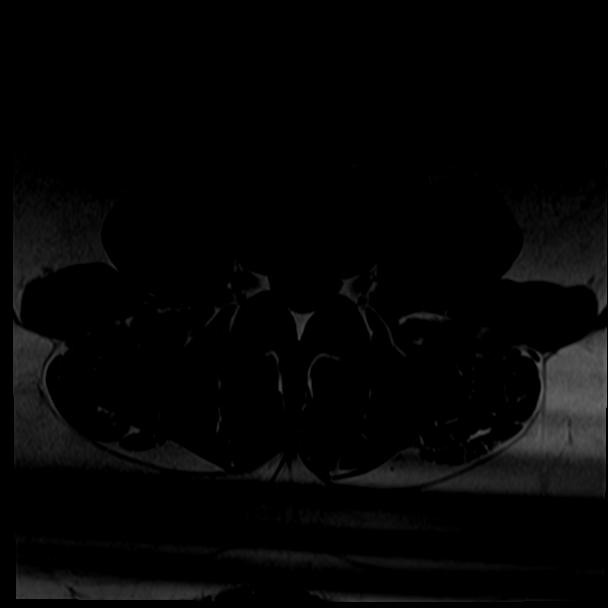
[im 28/40]
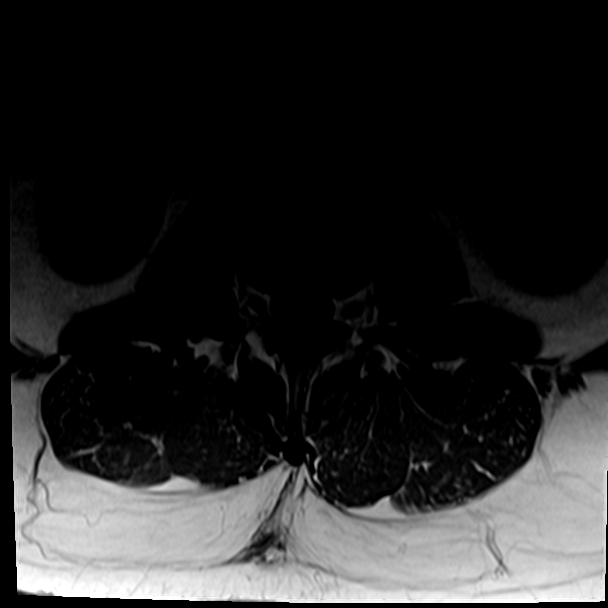
[im 34/40]
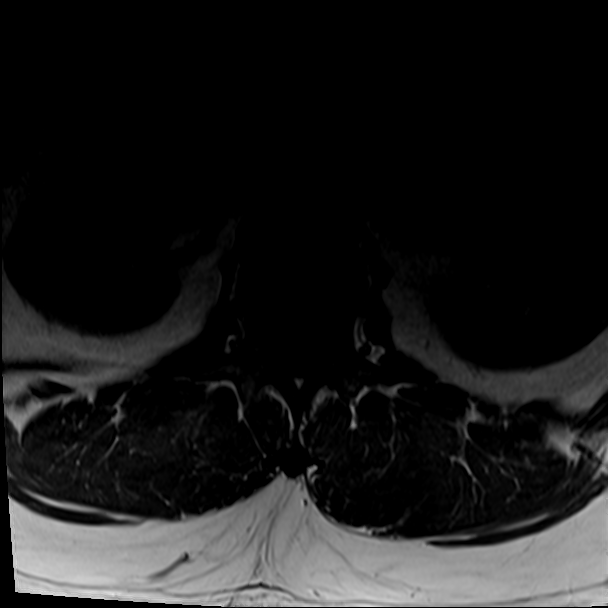

[28 of 48 positions shown; findings below may reference images not displayed]

FINDINGS: Segmentation: Normal, concordant with the thoracic spine numbering
today.

Alignment:  Normal lumbar lordosis.

Vertebrae: No marrow edema or evidence of acute osseous abnormality.
Visualized bone marrow signal is within normal limits. Intact
visible sacrum and SI joints.

Conus medullaris and cauda equina: Conus extends to the T12-L1
level. No lower spinal cord or conus signal abnormality. Fairly
capacious lumbar spinal canal. Normal cauda equina nerve roots.

Paraspinal and other soft tissues: Moderately to severely distended
urinary bladder is partially visible. Otherwise negative.

Disc levels:

Normal intervertebral disc signal and morphology from T12-L1 through
L5-S1, aside from mild L4-L5 disc desiccation and disc bulging. Mild
facet hypertrophy in the lumbar spine maximal at L4-L5. But no
lumbar spinal stenosis or neural impingement.
IMPRESSION: 1. Normal for age MRI appearance of the Lumbar spine. Minimal disc
degeneration at L4-L5. Mild facet hypertrophy. No lumbar spinal
stenosis or neural impingement.

2. Partially visible distended urinary bladder, suspicious for
urinary retention secondary to thoracic myelopathy detailed on
Thoracic MRI separately.

## 2021-09-04 IMAGING — MR MR THORACIC SPINE W/O CM
4 of 9 series · 18 of 48 positions shown · non-contrast
Comparison: CTA chest [DATE].
COMPARISON: CTA chest [DATE].

Addendum:
CLINICAL DATA: 36-year-old male with thoracolumbar back pain,
bilateral lower extremity weakness and numbness. Symptom onset after
heavy lifting.

EXAM:
MRI THORACIC SPINE WITHOUT CONTRAST
TECHNIQUE: Multiplanar, multisequence MR imaging of the thoracic spine was
performed. No intravenous contrast was administered.

[Series 16: T1 · sagittal · 3.0mm · 0.62mm/px · 2 of 9 slices shown (1 of 2)]
[im 1/9]
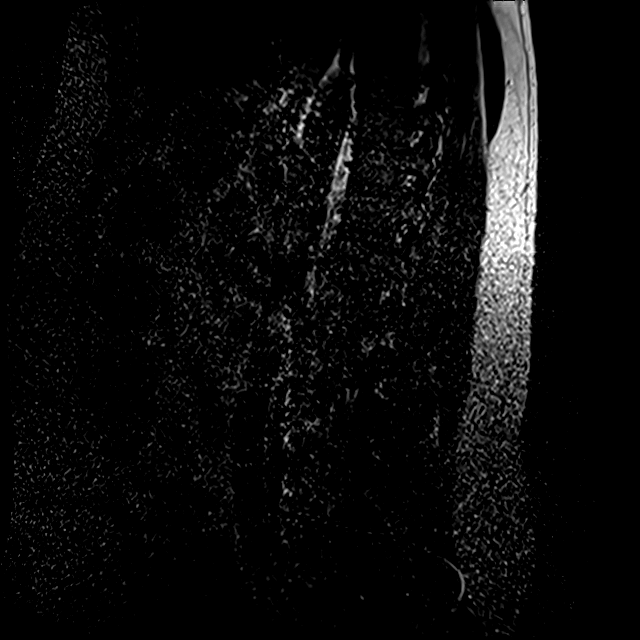
[im 9/9]
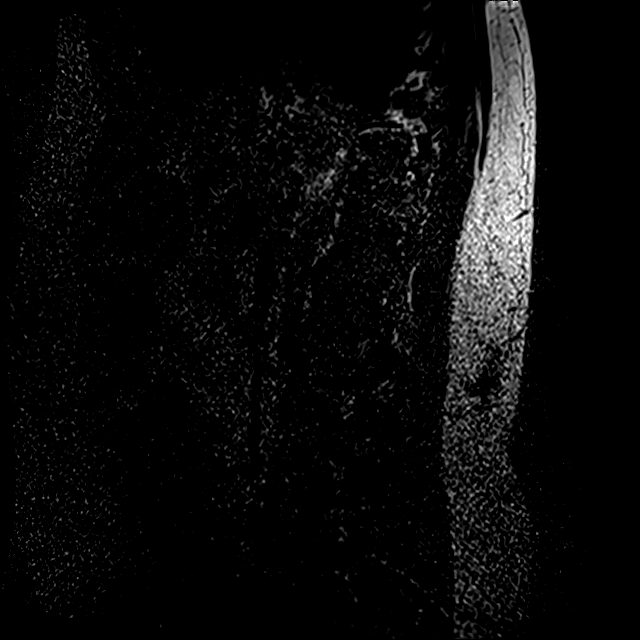

[Series 17: T1 · sagittal · 3.0mm · 0.62mm/px · 2 of 9 slices shown (2 of 2)]
[im 1/9]
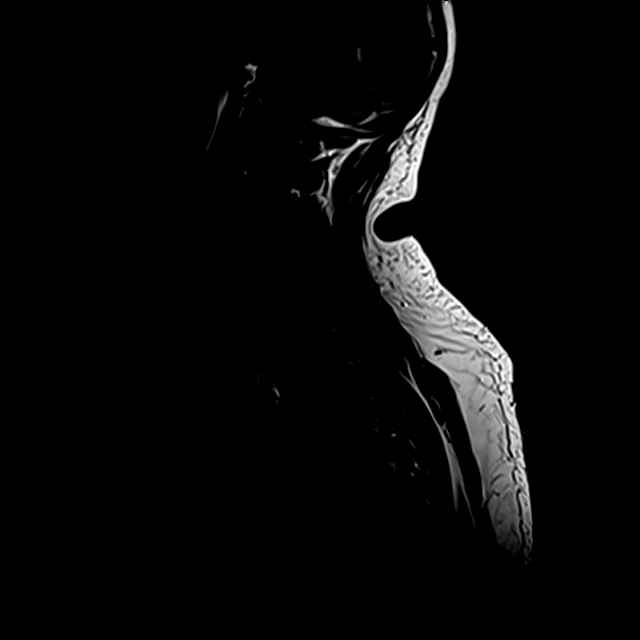
[im 9/9]
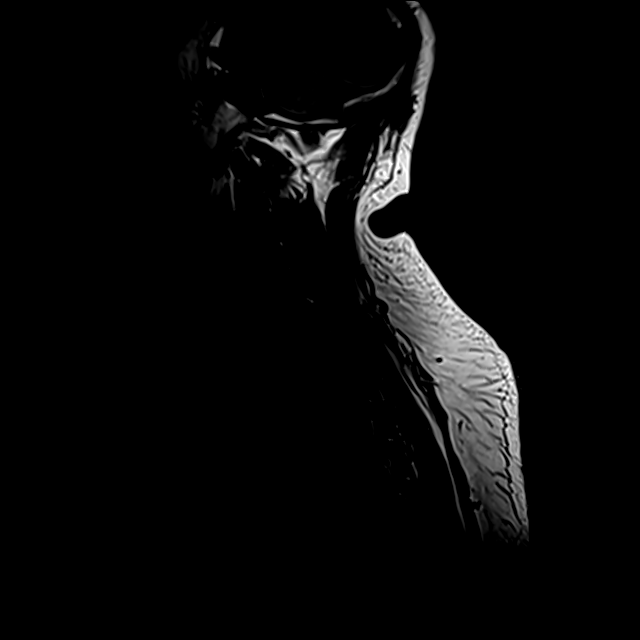

[Series 19: T2 · sagittal · 3.0mm · 0.88mm/px · 5 of 21 slices shown (1 of 2)]
[im 1/21]
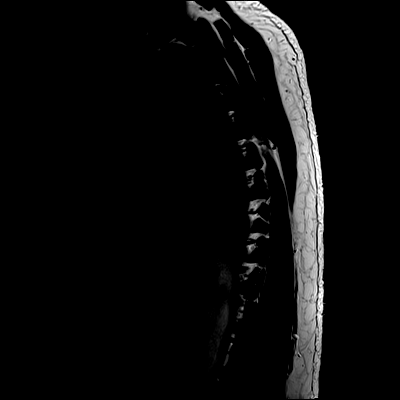
[im 6/21]
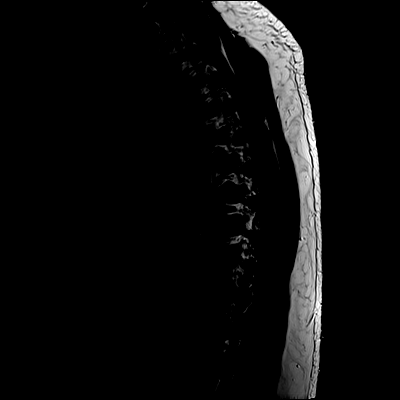
[im 11/21]
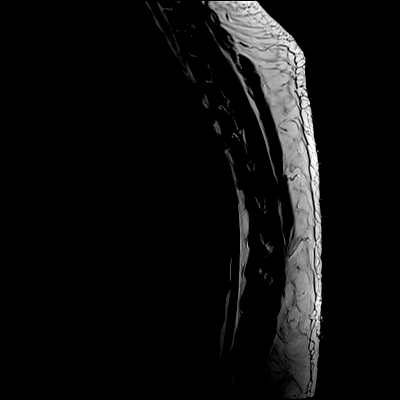
[im 16/21]
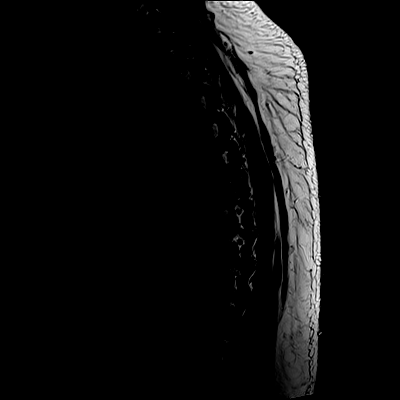
[im 21/21]
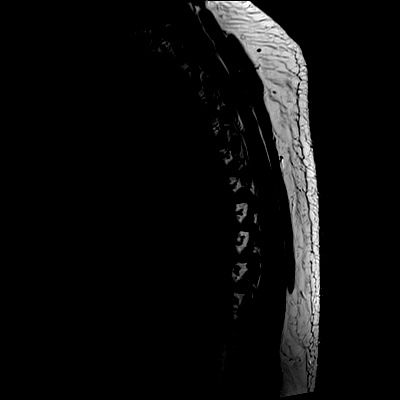

[Series 22: T2 · axial · 5.0mm · 0.59mm/px · z∈[-246,+17]mm · 9 of 39 slices shown (2 of 2)]
[im 1/39]
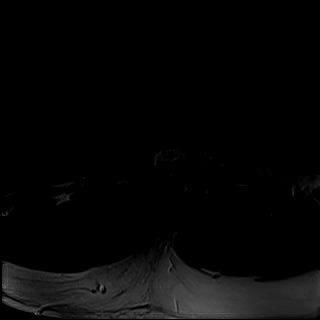
[im 5/39]
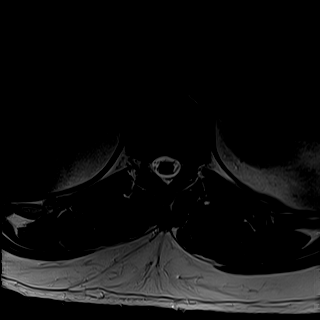
[im 10/39]
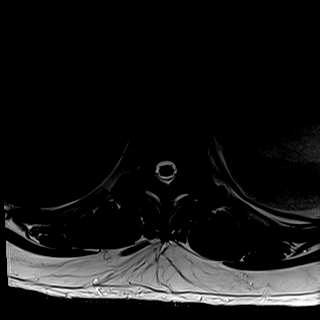
[im 15/39]
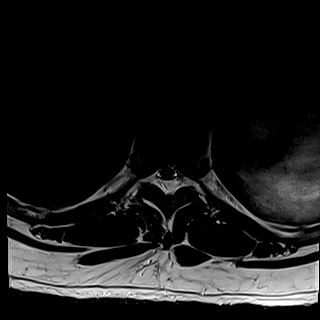
[im 20/39]
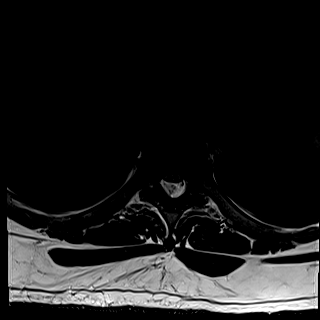
[im 24/39]
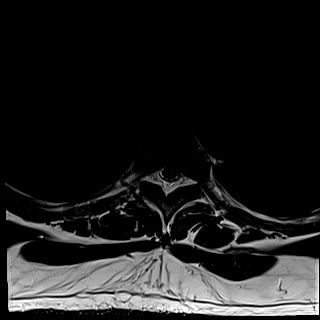
[im 29/39]
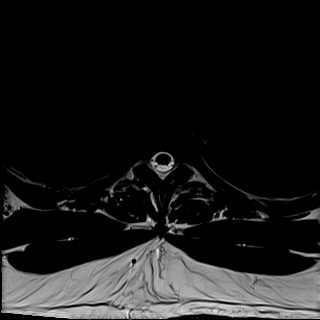
[im 34/39]
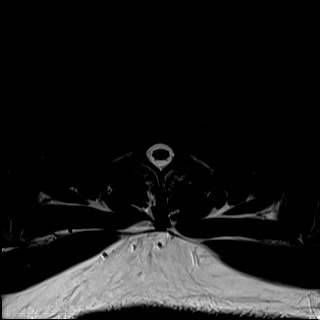
[im 39/39]
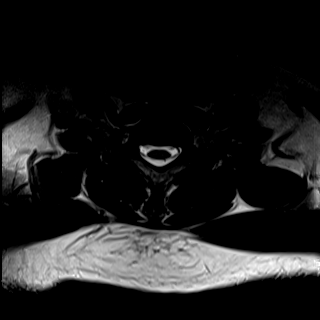

[18 of 48 positions shown; findings below may reference images not displayed]

FINDINGS: Limited cervical spine imaging:  Limited, grossly negative.

Thoracic spine segmentation: Appears normal on the chest CTA
yesterday.

Alignment:  Stable thoracic kyphosis.  No spondylolisthesis.

Vertebrae: No marrow edema or evidence of acute osseous abnormality.
Background bone marrow signal within normal limits.

Cord: Pronounced thoracic epidural lipomatosis beginning at the T4
level and continuing through T10. This alone effaces CSF from the
thecal sac at multiple levels. However, there is substantial
exacerbation of spinal stenosis at the T6-T7 level (see details
below). With spinal cord mass effect there. However, no spinal cord
edema or myelomalacia are evident. The conus medullaris is mostly
visible at T12-L1 and appears normal.

Paraspinal and other soft tissues: Negative.

Disc levels:

T1-T2: Negative.

T2-T3: Negative.

T3-T4: Negative.

T4-T5: Mild thoracic epidural lipomatosis begins at this level.
Otherwise negative.

T5-T6: Epidural lipomatosis effaces CSF from the thecal sac here and
there is superimposed disc desiccation with a tiny right paracentral
disc protrusion and mild to moderate facet hypertrophy greater on
the left (series 22, image 17). Mild spinal stenosis and spinal cord
mass effect. No foraminal stenosis.

T6-T7: Disc space loss here with a small right paracentral disc
protrusion or disc osteophyte complex (series 22, image 19). Severe
facet and ligament flavum hypertrophy. Associated heterogeneity in
the dorsal epidural lipomatosis on all sequences (series 20, images
10 and 11, series 21, images 10 and 11) suggesting inflammation in
the epidural fat and possibly degenerative synovial cyst.

Combined there is moderate to severe spinal stenosis and spinal cord
mass effect. No cord signal abnormality. Severe right T6 foraminal
stenosis.

T7-T8: Left paracentral small to moderate disc extrusion
superimposed on epidural lipomatosis with mild to moderate overall
spinal stenosis and left hemi cord mass effect (series 22, image
22). Mild facet hypertrophy on the left results in mild left T7
foraminal stenosis.

T8-T9: Smaller right paracentral disc protrusion superimposed on
epidural lipomatosis with mild spinal stenosis and ventral cord mass
effect. No foraminal stenosis.

T9-T10: Epidural lipomatosis begins to GOROSTIAGA at this level. Small
right paracentral disc protrusion (series 22, image 29). Mild spinal
stenosis. No definite cord mass effect. No foraminal stenosis.

T10-T11: Left paracentral disc bulging. No significant stenosis.

T11-T12: Negative.

T12-L1: Negative.
IMPRESSION: 1. Pronounced thoracic epidural lipomatosis in the midthoracic
spine, with indurated appearance of the dorsal fat at the T6-T7
level where combined paracentral disc herniation and severe
posterior element hypertrophy result in Moderate To Severe Spinal
Stenosis. Associated Cord compression, but no cord edema or
myelomalacia. Severe right T6 foraminal stenosis. Perhaps a
degenerative posterior synovial cyst contributes to the findings
here.

2. Small disc herniations also at T5-T6, T7-T8, and T8-T9 combined
with epidural lipomatosis for additional spinal stenosis (mild to
moderate at T7-T8) and spinal cord mass effect. No cord signal
abnormality.

ADDENDUM:
Both the Thoracic and Lumbar MRI salient findings were discussed by
telephone with Dr. GOROSTIAGA on [DATE] at [OY] hours.

*** End of Addendum ***
FINDINGS: Limited cervical spine imaging:  Limited, grossly negative.

Thoracic spine segmentation: Appears normal on the chest CTA
yesterday.

Alignment:  Stable thoracic kyphosis.  No spondylolisthesis.

Vertebrae: No marrow edema or evidence of acute osseous abnormality.
Background bone marrow signal within normal limits.

Cord: Pronounced thoracic epidural lipomatosis beginning at the T4
level and continuing through T10. This alone effaces CSF from the
thecal sac at multiple levels. However, there is substantial
exacerbation of spinal stenosis at the T6-T7 level (see details
below). With spinal cord mass effect there. However, no spinal cord
edema or myelomalacia are evident. The conus medullaris is mostly
visible at T12-L1 and appears normal.

Paraspinal and other soft tissues: Negative.

Disc levels:

T1-T2: Negative.

T2-T3: Negative.

T3-T4: Negative.

T4-T5: Mild thoracic epidural lipomatosis begins at this level.
Otherwise negative.

T5-T6: Epidural lipomatosis effaces CSF from the thecal sac here and
there is superimposed disc desiccation with a tiny right paracentral
disc protrusion and mild to moderate facet hypertrophy greater on
the left (series 22, image 17). Mild spinal stenosis and spinal cord
mass effect. No foraminal stenosis.

T6-T7: Disc space loss here with a small right paracentral disc
protrusion or disc osteophyte complex (series 22, image 19). Severe
facet and ligament flavum hypertrophy. Associated heterogeneity in
the dorsal epidural lipomatosis on all sequences (series 20, images
10 and 11, series 21, images 10 and 11) suggesting inflammation in
the epidural fat and possibly degenerative synovial cyst.

Combined there is moderate to severe spinal stenosis and spinal cord
mass effect. No cord signal abnormality. Severe right T6 foraminal
stenosis.

T7-T8: Left paracentral small to moderate disc extrusion
superimposed on epidural lipomatosis with mild to moderate overall
spinal stenosis and left hemi cord mass effect (series 22, image
22). Mild facet hypertrophy on the left results in mild left T7
foraminal stenosis.

T8-T9: Smaller right paracentral disc protrusion superimposed on
epidural lipomatosis with mild spinal stenosis and ventral cord mass
effect. No foraminal stenosis.

T9-T10: Epidural lipomatosis begins to GOROSTIAGA at this level. Small
right paracentral disc protrusion (series 22, image 29). Mild spinal
stenosis. No definite cord mass effect. No foraminal stenosis.

T10-T11: Left paracentral disc bulging. No significant stenosis.

T11-T12: Negative.

T12-L1: Negative.
IMPRESSION: 1. Pronounced thoracic epidural lipomatosis in the midthoracic
spine, with indurated appearance of the dorsal fat at the T6-T7
level where combined paracentral disc herniation and severe
posterior element hypertrophy result in Moderate To Severe Spinal
Stenosis. Associated Cord compression, but no cord edema or
myelomalacia. Severe right T6 foraminal stenosis. Perhaps a
degenerative posterior synovial cyst contributes to the findings
here.

2. Small disc herniations also at T5-T6, T7-T8, and T8-T9 combined
with epidural lipomatosis for additional spinal stenosis (mild to
moderate at T7-T8) and spinal cord mass effect. No cord signal
abnormality.

## 2021-09-04 SURGERY — LUMBAR LAMINECTOMY/DECOMPRESSION MICRODISCECTOMY 1 LEVEL
Anesthesia: General | Site: Spine Lumbar

## 2021-09-04 MED ORDER — ALBUMIN HUMAN 5 % IV SOLN: INTRAVENOUS | Status: DC | PRN

## 2021-09-04 MED ORDER — ONDANSETRON HCL 4 MG PO TABS: 4.0000 mg | ORAL_TABLET | Freq: Four times a day (QID) | ORAL | Status: DC | PRN

## 2021-09-04 MED ORDER — THROMBIN 5000 UNITS EX SOLR: OROMUCOSAL | Status: DC | PRN

## 2021-09-04 MED ORDER — BUPIVACAINE-EPINEPHRINE 0.5% -1:200000 IJ SOLN
INTRAMUSCULAR | Status: AC
  Filled 2021-09-04: qty 1

## 2021-09-04 MED ORDER — LIDOCAINE HCL (CARDIAC) PF 100 MG/5ML IV SOSY: PREFILLED_SYRINGE | INTRAVENOUS | Status: DC | PRN

## 2021-09-04 MED ORDER — SUGAMMADEX SODIUM 200 MG/2ML IV SOLN
INTRAVENOUS | Status: DC | PRN
  Administered 2021-09-04: 200 mg via INTRAVENOUS

## 2021-09-04 MED ORDER — CELECOXIB 200 MG PO CAPS
ORAL_CAPSULE | ORAL | Status: AC
  Filled 2021-09-04: qty 2

## 2021-09-04 MED ORDER — CEFAZOLIN IN SODIUM CHLORIDE 3-0.9 GM/100ML-% IV SOLN
INTRAVENOUS | Status: AC
  Filled 2021-09-04: qty 100

## 2021-09-04 MED ORDER — GABAPENTIN 300 MG PO CAPS
300.0000 mg | ORAL_CAPSULE | ORAL | Status: AC
  Administered 2021-09-04: 300 mg via ORAL
  Filled 2021-09-04: qty 1

## 2021-09-04 MED ORDER — LIDOCAINE-EPINEPHRINE 1 %-1:100000 IJ SOLN
INTRAMUSCULAR | Status: AC
  Filled 2021-09-04: qty 1

## 2021-09-04 MED ORDER — SODIUM CHLORIDE 0.9 % IV SOLN
250.0000 mL | INTRAVENOUS | Status: DC
  Administered 2021-09-04: 250 mL via INTRAVENOUS

## 2021-09-04 MED ORDER — METHOCARBAMOL 500 MG PO TABS
500.0000 mg | ORAL_TABLET | Freq: Four times a day (QID) | ORAL | Status: DC | PRN
  Administered 2021-09-05 – 2021-09-15 (×12): 500 mg via ORAL
  Filled 2021-09-04 (×12): qty 1

## 2021-09-04 MED ORDER — MIDAZOLAM HCL 2 MG/2ML IJ SOLN
INTRAMUSCULAR | Status: DC | PRN
  Administered 2021-09-04: 2 mg via INTRAVENOUS

## 2021-09-04 MED ORDER — SODIUM CHLORIDE 0.9 % IV SOLN
2.0000 g | Freq: Two times a day (BID) | INTRAVENOUS | Status: DC
  Administered 2021-09-04 – 2021-09-06 (×4): 2 g via INTRAVENOUS
  Filled 2021-09-04 (×4): qty 20

## 2021-09-04 MED ORDER — PHENOL 1.4 % MT LIQD: 1.0000 | OROMUCOSAL | Status: DC | PRN

## 2021-09-04 MED ORDER — THROMBIN 5000 UNITS EX SOLR
CUTANEOUS | Status: AC
  Filled 2021-09-04: qty 15000

## 2021-09-04 MED ORDER — HYDROMORPHONE HCL 1 MG/ML IJ SOLN
1.0000 mg | INTRAMUSCULAR | Status: DC | PRN
  Administered 2021-09-06 – 2021-09-07 (×2): 1 mg via INTRAVENOUS
  Filled 2021-09-04 (×2): qty 1

## 2021-09-04 MED ORDER — PANTOPRAZOLE SODIUM 40 MG PO TBEC
40.0000 mg | DELAYED_RELEASE_TABLET | Freq: Every day | ORAL | Status: DC
  Administered 2021-09-04 – 2021-09-10 (×7): 40 mg via ORAL
  Filled 2021-09-04 (×7): qty 1

## 2021-09-04 MED ORDER — ACETAMINOPHEN 325 MG PO TABS
650.0000 mg | ORAL_TABLET | ORAL | Status: DC | PRN
  Administered 2021-09-05 – 2021-09-09 (×2): 650 mg via ORAL
  Filled 2021-09-04 (×2): qty 2

## 2021-09-04 MED ORDER — DEXAMETHASONE SODIUM PHOSPHATE 10 MG/ML IJ SOLN
INTRAMUSCULAR | Status: DC | PRN
  Administered 2021-09-04: 10 mg via INTRAVENOUS

## 2021-09-04 MED ORDER — LIDOCAINE-EPINEPHRINE 1 %-1:100000 IJ SOLN
INTRAMUSCULAR | Status: DC | PRN
  Administered 2021-09-04: 30 mL

## 2021-09-04 MED ORDER — ACETAMINOPHEN 500 MG PO TABS
ORAL_TABLET | ORAL | Status: AC
  Filled 2021-09-04: qty 2

## 2021-09-04 MED ORDER — ACETAMINOPHEN 650 MG RE SUPP: 650.0000 mg | RECTAL | Status: DC | PRN

## 2021-09-04 MED ORDER — ACETAMINOPHEN 500 MG PO TABS
1000.0000 mg | ORAL_TABLET | Freq: Once | ORAL | Status: AC
  Administered 2021-09-04: 1000 mg via ORAL

## 2021-09-04 MED ORDER — PROPOFOL 10 MG/ML IV BOLUS
INTRAVENOUS | Status: AC
  Filled 2021-09-04: qty 20

## 2021-09-04 MED ORDER — MENTHOL 3 MG MT LOZG
1.0000 | LOZENGE | OROMUCOSAL | Status: DC | PRN
  Filled 2021-09-04: qty 9

## 2021-09-04 MED ORDER — FENTANYL CITRATE (PF) 250 MCG/5ML IJ SOLN
INTRAMUSCULAR | Status: DC | PRN
  Administered 2021-09-04 (×2): 50 ug via INTRAVENOUS
  Administered 2021-09-04: 150 ug via INTRAVENOUS

## 2021-09-04 MED ORDER — FENTANYL CITRATE (PF) 250 MCG/5ML IJ SOLN
INTRAMUSCULAR | Status: AC
  Filled 2021-09-04: qty 5

## 2021-09-04 MED ORDER — ROSUVASTATIN CALCIUM 5 MG PO TABS
10.0000 mg | ORAL_TABLET | Freq: Every day | ORAL | Status: DC
  Administered 2021-09-04 – 2021-09-15 (×12): 10 mg via ORAL
  Filled 2021-09-04 (×12): qty 2

## 2021-09-04 MED ORDER — LACTATED RINGERS IV SOLN: INTRAVENOUS | Status: DC

## 2021-09-04 MED ORDER — NOREPINEPHRINE 4 MG/250ML-% IV SOLN
2.0000 ug/min | INTRAVENOUS | Status: DC
  Administered 2021-09-04: 2 ug/min via INTRAVENOUS
  Administered 2021-09-05: 6 ug/min via INTRAVENOUS
  Administered 2021-09-06: 10 ug/min via INTRAVENOUS
  Administered 2021-09-06 (×2): 8 ug/min via INTRAVENOUS
  Administered 2021-09-07 (×2): 7 ug/min via INTRAVENOUS
  Administered 2021-09-08 (×3): 9 ug/min via INTRAVENOUS
  Administered 2021-09-09 – 2021-09-10 (×3): 8 ug/min via INTRAVENOUS
  Administered 2021-09-10: 6 ug/min via INTRAVENOUS
  Filled 2021-09-04 (×16): qty 250

## 2021-09-04 MED ORDER — LIDOCAINE 2% (20 MG/ML) 5 ML SYRINGE
INTRAMUSCULAR | Status: DC | PRN
  Administered 2021-09-04: 100 mg via INTRAVENOUS

## 2021-09-04 MED ORDER — POLYVINYL ALCOHOL 1.4 % OP SOLN
2.0000 [drp] | OPHTHALMIC | Status: DC | PRN
Start: 2021-09-04 — End: 2021-09-15
  Filled 2021-09-04 (×2): qty 15

## 2021-09-04 MED ORDER — SODIUM CHLORIDE 0.9 % IV SOLN
250.0000 mL | INTRAVENOUS | Status: DC
  Administered 2021-09-05: 250 mL via INTRAVENOUS

## 2021-09-04 MED ORDER — CEFAZOLIN IN SODIUM CHLORIDE 3-0.9 GM/100ML-% IV SOLN: 3.0000 g | INTRAVENOUS | Status: DC

## 2021-09-04 MED ORDER — DEXAMETHASONE SODIUM PHOSPHATE 10 MG/ML IJ SOLN
INTRAMUSCULAR | Status: AC
  Filled 2021-09-04: qty 1

## 2021-09-04 MED ORDER — ONDANSETRON HCL 4 MG/2ML IJ SOLN
INTRAMUSCULAR | Status: DC | PRN
  Administered 2021-09-04: 4 mg via INTRAVENOUS

## 2021-09-04 MED ORDER — HEMOSTATIC AGENTS (NO CHARGE) OPTIME
TOPICAL | Status: DC | PRN
  Administered 2021-09-04: 1 via TOPICAL

## 2021-09-04 MED ORDER — SODIUM CHLORIDE 0.9% FLUSH
3.0000 mL | Freq: Two times a day (BID) | INTRAVENOUS | Status: DC
  Administered 2021-09-04 – 2021-09-09 (×8): 3 mL via INTRAVENOUS
  Administered 2021-09-10: 10 mL via INTRAVENOUS

## 2021-09-04 MED ORDER — CHLORHEXIDINE GLUCONATE 0.12 % MT SOLN
OROMUCOSAL | Status: AC
  Filled 2021-09-04: qty 15

## 2021-09-04 MED ORDER — PANTOPRAZOLE SODIUM 40 MG IV SOLR: 40.0000 mg | Freq: Every day | INTRAVENOUS | Status: DC

## 2021-09-04 MED ORDER — OXYCODONE HCL 5 MG PO TABS
5.0000 mg | ORAL_TABLET | ORAL | Status: DC | PRN
  Administered 2021-09-05 – 2021-09-15 (×20): 5 mg via ORAL
  Filled 2021-09-04 (×19): qty 1

## 2021-09-04 MED ORDER — CHLORHEXIDINE GLUCONATE 0.12 % MT SOLN: 15.0000 mL | Freq: Once | OROMUCOSAL | Status: DC

## 2021-09-04 MED ORDER — CHLORHEXIDINE GLUCONATE CLOTH 2 % EX PADS
6.0000 | MEDICATED_PAD | Freq: Every day | CUTANEOUS | Status: DC
  Administered 2021-09-06 – 2021-09-15 (×9): 6 via TOPICAL

## 2021-09-04 MED ORDER — HEPARIN SODIUM (PORCINE) 5000 UNIT/ML IJ SOLN
5000.0000 [IU] | Freq: Two times a day (BID) | INTRAMUSCULAR | Status: DC
  Administered 2021-09-05 – 2021-09-06 (×3): 5000 [IU] via SUBCUTANEOUS
  Filled 2021-09-04 (×3): qty 1

## 2021-09-04 MED ORDER — ROCURONIUM BROMIDE 10 MG/ML (PF) SYRINGE
PREFILLED_SYRINGE | INTRAVENOUS | Status: DC | PRN
  Administered 2021-09-04: 100 mg via INTRAVENOUS

## 2021-09-04 MED ORDER — LIDOCAINE 2% (20 MG/ML) 5 ML SYRINGE
INTRAMUSCULAR | Status: AC
  Filled 2021-09-04: qty 5

## 2021-09-04 MED ORDER — THROMBIN 5000 UNITS EX SOLR
CUTANEOUS | Status: DC | PRN
  Administered 2021-09-04 (×2): 5000 [IU] via TOPICAL

## 2021-09-04 MED ORDER — VANCOMYCIN HCL 10 G IV SOLR
2500.0000 mg | Freq: Once | INTRAVENOUS | Status: AC
  Administered 2021-09-04: 2500 mg via INTRAVENOUS
  Filled 2021-09-04: qty 2500

## 2021-09-04 MED ORDER — CEFAZOLIN SODIUM-DEXTROSE 2-3 GM-%(50ML) IV SOLR
INTRAVENOUS | Status: DC | PRN
Start: 2021-09-04 — End: 2021-09-04
  Administered 2021-09-04: 3 g via INTRAVENOUS

## 2021-09-04 MED ORDER — SODIUM CHLORIDE 0.9 % IV SOLN: INTRAVENOUS | Status: DC

## 2021-09-04 MED ORDER — ORAL CARE MOUTH RINSE: 15.0000 mL | Freq: Once | OROMUCOSAL | Status: DC

## 2021-09-04 MED ORDER — OXYCODONE-ACETAMINOPHEN 5-325 MG PO TABS
1.0000 | ORAL_TABLET | Freq: Once | ORAL | Status: AC
  Administered 2021-09-04: 1 via ORAL
  Filled 2021-09-04: qty 1

## 2021-09-04 MED ORDER — PROPOFOL 10 MG/ML IV BOLUS
INTRAVENOUS | Status: DC | PRN
  Administered 2021-09-04: 50 mg via INTRAVENOUS
  Administered 2021-09-04: 200 mg via INTRAVENOUS

## 2021-09-04 MED ORDER — ONDANSETRON HCL 4 MG/2ML IJ SOLN: 4.0000 mg | Freq: Four times a day (QID) | INTRAMUSCULAR | Status: DC | PRN

## 2021-09-04 MED ORDER — 0.9 % SODIUM CHLORIDE (POUR BTL) OPTIME
TOPICAL | Status: DC | PRN
  Administered 2021-09-04: 1000 mL

## 2021-09-04 MED ORDER — ONDANSETRON HCL 4 MG/2ML IJ SOLN
INTRAMUSCULAR | Status: AC
  Filled 2021-09-04: qty 2

## 2021-09-04 MED ORDER — MIDAZOLAM HCL 2 MG/2ML IJ SOLN
INTRAMUSCULAR | Status: AC
  Filled 2021-09-04: qty 2

## 2021-09-04 MED ORDER — CELECOXIB 200 MG PO CAPS
200.0000 mg | ORAL_CAPSULE | Freq: Once | ORAL | Status: AC
  Administered 2021-09-04: 200 mg via ORAL

## 2021-09-04 MED ORDER — PHENYLEPHRINE HCL-NACL 20-0.9 MG/250ML-% IV SOLN
INTRAVENOUS | Status: DC | PRN
  Administered 2021-09-04: 25 ug/min via INTRAVENOUS

## 2021-09-04 MED ORDER — SODIUM CHLORIDE 0.9% FLUSH: 3.0000 mL | INTRAVENOUS | Status: DC | PRN

## 2021-09-04 MED ORDER — CEFAZOLIN SODIUM-DEXTROSE 2-4 GM/100ML-% IV SOLN
2.0000 g | Freq: Three times a day (TID) | INTRAVENOUS | Status: DC
  Administered 2021-09-04: 2 g via INTRAVENOUS
  Filled 2021-09-04: qty 100

## 2021-09-04 MED ORDER — OXYCODONE HCL 5 MG PO TABS
10.0000 mg | ORAL_TABLET | ORAL | Status: DC | PRN
  Administered 2021-09-06 – 2021-09-13 (×4): 10 mg via ORAL
  Filled 2021-09-04 (×6): qty 2

## 2021-09-04 MED ORDER — VANCOMYCIN HCL 1500 MG/300ML IV SOLN
1500.0000 mg | Freq: Two times a day (BID) | INTRAVENOUS | Status: DC
  Administered 2021-09-05: 1500 mg via INTRAVENOUS
  Filled 2021-09-04: qty 300

## 2021-09-04 MED ORDER — METHOCARBAMOL 1000 MG/10ML IJ SOLN: 500.0000 mg | Freq: Four times a day (QID) | INTRAVENOUS | Status: DC | PRN

## 2021-09-04 MED ORDER — DOCUSATE SODIUM 100 MG PO CAPS
100.0000 mg | ORAL_CAPSULE | Freq: Two times a day (BID) | ORAL | Status: DC
  Administered 2021-09-04 – 2021-09-10 (×12): 100 mg via ORAL
  Filled 2021-09-04 (×12): qty 1

## 2021-09-04 SURGICAL SUPPLY — 66 items
ADH SKN CLS APL DERMABOND .7 (GAUZE/BANDAGES/DRESSINGS) ×1
BAG COUNTER SPONGE SURGICOUNT (BAG) ×2 IMPLANT
BAG SPNG CNTER NS LX DISP (BAG) ×1
BAND INSRT 18 STRL LF DISP RB (MISCELLANEOUS) ×2
BAND RUBBER #18 3X1/16 STRL (MISCELLANEOUS) ×4 IMPLANT
BUR CARBIDE MATCH 3.0 (BURR) ×2 IMPLANT
CNTNR URN SCR LID CUP LEK RST (MISCELLANEOUS) ×1 IMPLANT
CONT SPEC 4OZ STRL OR WHT (MISCELLANEOUS) ×4
COVER MAYO STAND STRL (DRAPES) ×1 IMPLANT
DECANTER SPIKE VIAL GLASS SM (MISCELLANEOUS) ×1 IMPLANT
DERMABOND ADVANCED (GAUZE/BANDAGES/DRESSINGS) ×1
DERMABOND ADVANCED .7 DNX12 (GAUZE/BANDAGES/DRESSINGS) IMPLANT
DRAIN JACKSON RD 7FR 3/32 (WOUND CARE) IMPLANT
DRAPE C-ARM 42X72 X-RAY (DRAPES) ×2 IMPLANT
DRAPE LAPAROTOMY 100X72X124 (DRAPES) ×2 IMPLANT
DRAPE MICROSCOPE LEICA (MISCELLANEOUS) ×2 IMPLANT
DRAPE SURG 17X23 STRL (DRAPES) ×2 IMPLANT
DRSG OPSITE 4X5.5 SM (GAUZE/BANDAGES/DRESSINGS) ×1 IMPLANT
DRSG OPSITE POSTOP 4X8 (GAUZE/BANDAGES/DRESSINGS) ×1 IMPLANT
DURAPREP 26ML APPLICATOR (WOUND CARE) ×2 IMPLANT
ELECT BLADE INSULATED 4IN (ELECTROSURGICAL) ×2
ELECT REM PT RETURN 9FT ADLT (ELECTROSURGICAL) ×2
ELECTRODE BLADE INSULATED 4IN (ELECTROSURGICAL) ×1 IMPLANT
ELECTRODE REM PT RTRN 9FT ADLT (ELECTROSURGICAL) ×1 IMPLANT
EVACUATOR 1/8 PVC DRAIN (DRAIN) ×1 IMPLANT
GAUZE 4X4 16PLY ~~LOC~~+RFID DBL (SPONGE) IMPLANT
GAUZE SPONGE 4X4 12PLY STRL (GAUZE/BANDAGES/DRESSINGS) ×2 IMPLANT
GAUZE SPONGE 4X4 12PLY STRL LF (GAUZE/BANDAGES/DRESSINGS) ×1 IMPLANT
GLOVE SRG 8 PF TXTR STRL LF DI (GLOVE) ×1 IMPLANT
GLOVE SURG ENC MOIS LTX SZ8 (GLOVE) ×2 IMPLANT
GLOVE SURG LTX SZ8 (GLOVE) ×2 IMPLANT
GLOVE SURG POLYISO LF SZ6.5 (GLOVE) ×3 IMPLANT
GLOVE SURG PR MICRO ENCORE 7.5 (GLOVE) ×1 IMPLANT
GLOVE SURG UNDER POLY LF SZ7 (GLOVE) ×1 IMPLANT
GLOVE SURG UNDER POLY LF SZ8 (GLOVE) ×2
GLOVE SURG UNDER POLY LF SZ8.5 (GLOVE) ×2 IMPLANT
GOWN STRL REUS W/ TWL LRG LVL3 (GOWN DISPOSABLE) IMPLANT
GOWN STRL REUS W/ TWL XL LVL3 (GOWN DISPOSABLE) ×2 IMPLANT
GOWN STRL REUS W/TWL 2XL LVL3 (GOWN DISPOSABLE) ×1 IMPLANT
GOWN STRL REUS W/TWL LRG LVL3 (GOWN DISPOSABLE) ×2
GOWN STRL REUS W/TWL XL LVL3 (GOWN DISPOSABLE) ×2
HEMOSTAT POWDER KIT SURGIFOAM (HEMOSTASIS) ×2 IMPLANT
KIT BASIN OR (CUSTOM PROCEDURE TRAY) ×2 IMPLANT
KIT POSITION SURG JACKSON T1 (MISCELLANEOUS) ×2 IMPLANT
KIT TURNOVER KIT B (KITS) ×2 IMPLANT
MARKER SKIN DUAL TIP RULER LAB (MISCELLANEOUS) ×3 IMPLANT
NDL HYPO 25X1 1.5 SAFETY (NEEDLE) ×1 IMPLANT
NDL SPNL 18GX3.5 QUINCKE PK (NEEDLE) IMPLANT
NEEDLE HYPO 25X1 1.5 SAFETY (NEEDLE) ×2 IMPLANT
NEEDLE SPNL 18GX3.5 QUINCKE PK (NEEDLE) ×2 IMPLANT
NS IRRIG 1000ML POUR BTL (IV SOLUTION) ×2 IMPLANT
PACK LAMINECTOMY NEURO (CUSTOM PROCEDURE TRAY) ×2 IMPLANT
PAD ARMBOARD 7.5X6 YLW CONV (MISCELLANEOUS) ×9 IMPLANT
PATTIES SURGICAL .5 X.5 (GAUZE/BANDAGES/DRESSINGS) IMPLANT
PATTIES SURGICAL .5 X1 (DISPOSABLE) IMPLANT
PATTIES SURGICAL 1X1 (DISPOSABLE) IMPLANT
SPONGE SURGIFOAM ABS GEL SZ50 (HEMOSTASIS) ×2 IMPLANT
SPONGE T-LAP 4X18 ~~LOC~~+RFID (SPONGE) IMPLANT
SUT VIC AB 0 CT1 18XCR BRD8 (SUTURE) ×1 IMPLANT
SUT VIC AB 0 CT1 8-18 (SUTURE) ×2
SUT VIC AB 2-0 CP2 18 (SUTURE) ×3 IMPLANT
SUT VIC AB 3-0 SH 8-18 (SUTURE) ×3 IMPLANT
SWAB CULTURE ESWAB REG 1ML (MISCELLANEOUS) ×1 IMPLANT
TOWEL GREEN STERILE (TOWEL DISPOSABLE) IMPLANT
TOWEL GREEN STERILE FF (TOWEL DISPOSABLE) IMPLANT
WATER STERILE IRR 1000ML POUR (IV SOLUTION) ×2 IMPLANT

## 2021-09-04 NOTE — Anesthesia Procedure Notes (Addendum)
Procedure Name: Intubation ?Date/Time: 09/04/2021 2:09 PM ?Performed by: Claris Che, CRNA ?Pre-anesthesia Checklist: Patient identified, Emergency Drugs available, Suction available, Patient being monitored and Timeout performed ?Patient Re-evaluated:Patient Re-evaluated prior to induction ?Oxygen Delivery Method: Circle system utilized ?Preoxygenation: Pre-oxygenation with 100% oxygen ?Induction Type: IV induction and Cricoid Pressure applied ?Ventilation: Two handed mask ventilation required ?Laryngoscope Size: Mac and 4 ?Grade View: Grade II ?Tube type: Oral ?Tube size: 7.5 mm ?Number of attempts: 1 ?Airway Equipment and Method: Stylet ?Placement Confirmation: ETT inserted through vocal cords under direct vision, positive ETCO2 and breath sounds checked- equal and bilateral ?Secured at: 24 cm ?Tube secured with: Tape ?Dental Injury: Teeth and Oropharynx as per pre-operative assessment  ? ? ? ? ?

## 2021-09-04 NOTE — ED Notes (Signed)
Consent at the bedside  

## 2021-09-04 NOTE — Progress Notes (Signed)
Pharmacy Antibiotic Note ? ?Cristian Grant is a 36 y.o. 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  Pharmacy has been consulted for vancomycin dosing. ? ?Plan: ?Vancomycin 2500 mg x 1, then 1500 mg q 12 hrs.  Calculated AUC 502 (using Scr 1.17) ?F/u cultures, renal function and clinical course. ? ?Height: 6\' 2"  (188 cm) ?Weight: 136.1 kg (300 lb) ?IBW/kg (Calculated) : 82.2 ? ?Temp (24hrs), Avg:98.6 ?F (37 ?C), Min:97.5 ?F (36.4 ?C), Max:100 ?F (37.8 ?C) ? ?Recent Labs  ?Lab 09/03/21 ?0306 09/03/21 ?1902  ?WBC 8.0 10.7*  ?CREATININE 1.13 1.17  ?  ?Estimated Creatinine Clearance: 126.9 mL/min (by C-G formula based on SCr of 1.17 mg/dL).   ? ?No Known Allergies ? ?Antimicrobials this admission:  ?Cefazolin 3/31 > x 2 doses ?Vanco 3/31 >  ? ?Dose adjustments this admission:  ? ?Microbiology results:  ?3/31- Epidural space swabs ? ?Thank you for allowing pharmacy to be a part of this patient?s care. ? ?4/31, Pharm D, BCPS, BCCP ?Clinical Pharmacist ? 09/04/2021 8:55 PM  ? ?[facility] pharmacy phone numbers are listed on [email or web address] ? ? ?

## 2021-09-04 NOTE — H&P (Signed)
? ? ?Providing Compassionate, Quality Care - Together ? ?NEUROSURGERY HISTORY & PHYSICAL ? ? ?Cristian Grant is an 36 y.o. male.   ?Chief Complaint: BLE weakness and numbness ?HPI: This is a 36 year old male with a PMHx of HLD, that notes lifting a heavy object 2 days ago and then had chest pain and BLE numbness. Since then his numbness was worse in the RLE>LLE and weakness has progressed. He could no longer stand last night and was brought to ED via EMS. States he has numbness in his abdomen and down into his legs. Denies bowel or bladder incontinence. 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 ? ?Denies recent travel, fevers. ? ? ?History reviewed. No pertinent family history. ?Social History:  reports that he has never smoked. He has never used smokeless tobacco. He reports current alcohol use. He reports that he does not currently use drugs. ? ?Allergies: No Known Allergies ? ?(Not in a hospital admission) ? ? ?Results for orders placed or performed during the hospital encounter of 09/03/21 (from the past 48 hour(s))  ?Basic metabolic panel     Status: Abnormal  ? Collection Time: 09/03/21  7:02 PM  ?Result Value Ref Range  ? Sodium 138 135 - 145 mmol/L  ? Potassium 4.0 3.5 - 5.1 mmol/L  ? Chloride 103 98 - 111 mmol/L  ? CO2 25 22 - 32 mmol/L  ? Glucose, Bld 104 (H) 70 - 99 mg/dL  ?  Comment: Glucose reference range applies only to samples taken after fasting for at least 8 hours.  ? BUN 14 6 - 20 mg/dL  ? Creatinine, Ser 1.17 0.61 - 1.24 mg/dL  ? Calcium 9.4 8.9 - 10.3 mg/dL  ? GFR, Estimated >60 >60 mL/min  ?  Comment: (NOTE) ?Calculated using the CKD-EPI Creatinine Equation (2021) ?  ? Anion gap 10 5 - 15  ?  Comment: Performed at [facility], [address], [place], [place] [address]  ?CBC     Status: Abnormal  ? Collection Time: 09/03/21  7:02 PM  ?Result Value Ref Range  ? WBC 10.7 (H) 4.0 - 10.5 K/uL  ? RBC 4.19 (L) 4.22 - 5.81 MIL/uL  ?  Hemoglobin 12.7 (L) 13.0 - 17.0 g/dL  ? HCT 39.6 39.0 - 52.0 %  ? MCV 94.5 80.0 - 100.0 fL  ? MCH 30.3 26.0 - 34.0 pg  ? MCHC 32.1 30.0 - 36.0 g/dL  ? RDW 13.5 11.5 - 15.5 %  ? Platelets 402 (H) 150 - 400 K/uL  ? nRBC 0.0 0.0 - 0.2 %  ?  Comment: Performed at [facility], [address] [place], [place] [address]  ?Troponin I (High Sensitivity)     Status: None  ? Collection Time: 09/03/21  7:02 PM  ?Result Value Ref Range  ? Troponin I (High Sensitivity) <2 <18 ng/L  ?  Comment: (NOTE) ?Elevated high sensitivity troponin I (hsTnI) values and significant  ?changes across serial measurements may suggest ACS but many other  ?chronic and acute conditions are known to elevate hsTnI results.  ?Refer to the "Links" section for chest pain algorithms and additional  ?guidance. ?Performed at [facility], [address], ?[place], [place] [address] ?  ?POC CBG, ED     Status: None  ? Collection Time: 09/03/21  8:13 PM  ?Result Value Ref Range  ? Glucose-Capillary 80 70 - 99 mg/dL  ?  Comment: Glucose reference  range applies only to samples taken after fasting for at least 8 hours.  ? ?CT Angio Chest PE W and/or Wo Contrast ? ?Result Date: 09/03/2021 ?CLINICAL DATA:  36 year old male with right side pleuritic chest pain. EXAM: CT ANGIOGRAPHY CHEST WITH CONTRAST TECHNIQUE: Multidetector CT imaging of the chest was performed using the standard protocol during bolus administration of intravenous contrast. Multiplanar CT image reconstructions and MIPs were obtained to evaluate the vascular anatomy. RADIATION DOSE REDUCTION: This exam was performed according to the departmental dose-optimization program which includes automated exposure control, adjustment of the mA and/or kV according to patient size and/or use of iterative reconstruction technique. CONTRAST:  137mL OMNIPAQUE IOHEXOL 350 MG/ML SOLN COMPARISON:  Portable chest 0306 hours. FINDINGS: Cardiovascular: Inadequate contrast bolus  timing in the pulmonary arterial tree initially. Repeat contrast bolus at 0455 hours is only modestly improved but adequate. Less motion artifact on the repeat images also. No focal filling defect identified in the pulmonary arteries to suggest acute pulmonary embolism. No cardiomegaly or pericardial effusion. No calcified coronary artery atherosclerosis is evident. Negative visible aorta. Mediastinum/Nodes: Negative. No mediastinal mass or lymphadenopathy. Lungs/Pleura: Low lung volumes with streaky bilateral middle lobe and lower lobe opacity most resembling atelectasis. Major airways are patent. No pleural effusion, consolidation, or convincing pulmonary inflammation. Upper Abdomen: Negative visible liver, gallbladder, spleen, pancreas, adrenal glands, kidneys, and bowel in the upper abdomen. Musculoskeletal: Negative. Review of the MIP images confirms the above findings. IMPRESSION: 1. Negative for acute pulmonary embolus. 2. Low lung volumes with streaky bilateral middle and lower lobe opacity most resembling atelectasis. Electronically Signed   By: Genevie Ann M.D.   On: 09/03/2021 05:12  ? ?MR THORACIC SPINE WO CONTRAST ? ?Addendum Date: 09/04/2021   ?ADDENDUM REPORT: 09/04/2021 07:36 ADDENDUM: Both the Thoracic and Lumbar MRI salient findings were discussed by telephone with Dr. Quintella Reichert on 09/04/2021 at 0723 hours. Electronically Signed   By: Genevie Ann M.D.   On: 09/04/2021 07:36  ? ?Result Date: 09/04/2021 ?CLINICAL DATA:  36 year old male with thoracolumbar back pain, bilateral lower extremity weakness and numbness. Symptom onset after heavy lifting. EXAM: MRI THORACIC SPINE WITHOUT CONTRAST TECHNIQUE: Multiplanar, multisequence MR imaging of the thoracic spine was performed. No intravenous contrast was administered. COMPARISON:  CTA chest 09/03/2021. FINDINGS: Limited cervical spine imaging:  Limited, grossly negative. Thoracic spine segmentation: Appears normal on the chest CTA yesterday. Alignment:   Stable thoracic kyphosis.  No spondylolisthesis. Vertebrae: No marrow edema or evidence of acute osseous abnormality. Background bone marrow signal within normal limits. Cord: Pronounced thoracic epidural lipomatosis beginning at the T4 level and continuing through T10. This alone effaces CSF from the thecal sac at multiple levels. However, there is substantial exacerbation of spinal stenosis at the T6-T7 level (see details below). With spinal cord mass effect there. However, no spinal cord edema or myelomalacia are evident. The conus medullaris is mostly visible at T12-L1 and appears normal. Paraspinal and other soft tissues: Negative. Disc levels: T1-T2: Negative. T2-T3: Negative. T3-T4: Negative. T4-T5: Mild thoracic epidural lipomatosis begins at this level. Otherwise negative. T5-T6: Epidural lipomatosis effaces CSF from the thecal sac here and there is superimposed disc desiccation with a tiny right paracentral disc protrusion and mild to moderate facet hypertrophy greater on the left (series 22, image 17). Mild spinal stenosis and spinal cord mass effect. No foraminal stenosis. T6-T7: Disc space loss here with a small right paracentral disc protrusion or disc osteophyte complex (series 22, image 19). Severe facet and ligament flavum hypertrophy.  Associated heterogeneity in the dorsal epidural lipomatosis on all sequences (series 20, images 10 and 11, series 21, images 10 and 11) suggesting inflammation in the epidural fat and possibly degenerative synovial cyst. Combined there is moderate to severe spinal stenosis and spinal cord mass effect. No cord signal abnormality. Severe right T6 foraminal stenosis. T7-T8: Left paracentral small to moderate disc extrusion superimposed on epidural lipomatosis with mild to moderate overall spinal stenosis and left hemi cord mass effect (series 22, image 22). Mild facet hypertrophy on the left results in mild left T7 foraminal stenosis. T8-T9: Smaller right paracentral  disc protrusion superimposed on epidural lipomatosis with mild spinal stenosis and ventral cord mass effect. No foraminal stenosis. T9-T10: Epidural lipomatosis begins to abate at this level. Small right paracentral

## 2021-09-04 NOTE — Op Note (Signed)
? ?Providing Compassionate, Quality Care - Together ? ?Date of service: 09/04/2021 ? 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 ?POSTOP DIAGNOSIS: Same, possible epidural abscess ? ?PROCEDURE: ?Bilateral T5, T6, T7 laminectomy for decompression of neural elements ?Right T6-7 medial facetectomy ?Intraoperative use of fluoroscopy ?Intraoperative use of microscope for microdissection ? ?SURGEON: Dr. Kendell Bane C. Kahlen Morais, DO ? ?ASSISTANT: Docia Barrier, NP ? ?ANESTHESIA: General Endotracheal ? ?EBL: 200 cc ? ?SPECIMENS: T6-7 epidural phlegmon, T6-7 epidural cultures ? ?DRAINS: Medium Hemovac ? ?COMPLICATIONS: None ? ?CONDITION: Hemodynamically stable ? ?HISTORY: ?Cristian Grant is a 36 y.o. male with a complaint of chest and upper back pain since lifting a heavy amplifier 2 days ago.  He noted last night at approximately 10 PM he had significant lower extremity weakness and numbness and tingling and therefore was brought to the emergency department via EMS.  He states his numbness was just above his umbilicus and inferiorly all the way down to his toes.  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  He also had small disc protrusion at this level ventrally that did not appear to be significantly stenotic.  His neurologic exam was significant for bilateral lower extremity weakness with minimal motion in his left dorsiflexion/plantarflexion at 1/5.  He had decreased sensation from approximately T7 and inferior.  There was increased reflexes in bilateral lower extremities with clonus in the bilateral lower extremities.  Given this I recommended surgical intervention urgently in the form of a T5-7 laminectomy, resection of  epidural lipomatosis, resection of synovial cyst for decompression.  I discussed extensively with the patient and his wife about risks, benefits and expected outcomes and unsure of his improvement given the timing of his presentation.  Informed consent was obtained. ? ?PROCEDURE IN DETAIL: ?The patient was brought to the operating room. After induction of general anesthesia, the patient was positioned on the operative table in the prone position. All pressure points were meticulously padded. Skin incision was then marked out using AP fluoroscopy and prepped and draped in the usual sterile fashion.  Physician driven timeout was performed. ? ?Incision was made sharply with 10 blade to the dorsal fascia.  Retractors placed in the wound.  Using Bovie electrocautery, subperiosteal dissection was performed to expose the T5 lamina bilaterally, T6 lamina bilaterally and T7 lamina bilaterally.  Using AP fluoroscopy, the appropriate levels were confirmed.  Deep retractors were placed in the wound.  The microscope was sterilely draped and brought into the field for the remainder the procedure.  Using a high-speed drill, the bilateral T5 lamina removed down to the ligamentum flavum.  This was then repeated at T6 down to the ligamentum flavum.  The superior portion of T7 lamina was then removed with a high-speed drill down to the epidural space.  Using a series of curettes and Kerrison rongeurs, the ligamentum flavum was removed at T5-6 and T6-7.  A significant amount of epidural lipomatosis was noted at each level.  Using microcurette's, this was evacuated with suction and micro curettes.  During this along the right lateral recess at T6-7, a small amount of pus was encountered and this was cultured.  The loculation around this region was sent for microbiology evaluation as a specimen.  The lateral recesses  were decompressed with Kerrison rongeurs and further epidural lipomatosis was evacuated with micro curettes and micro  nerve hook.  The bilateral lateral recesses at T5, T6, T7 were extensively explored with a blunt nerve hook and noted to be appropriately decompressed.  The thecal sac was pulsatile.  Epidural hemostasis was achieved with Surgifoam and cottonoids.  Deep retractors were taken out of the wound, soft tissue hemostasis was achieved with monopolar cautery.  A medium Hemovac was tunneled laterally and placed in the epidural space.  The wound was then closed in layers, 0 Vicryl sutures for muscle and fascia.  2-0 and 3-0 Vicryl sutures for dermis.  Skin was closed with skin glue and sterile dressing was applied. ? ?At the end of the case all sponge, needle, and instrument counts were correct. The patient was then transferred to the stretcher, extubated, and taken to the post-anesthesia care unit in stable hemodynamic condition. ? ? ?

## 2021-09-04 NOTE — ED Triage Notes (Signed)
Patient BIB EMS for B/L leg pain and numbness . Patient states he lifted something heavy when the symptoms began. Patient states now he is unable to bare weight on his right leg. Skin is intact . No obvious deformity noted . Patient seen at [facility] .  ?

## 2021-09-04 NOTE — H&P (Deleted)
Chief Complaint: BLE weakness and numbness ?HPI: Cristian Grant is a 36 y.o. 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 He has a PmHx of HLD and low back pain for which he has been managing with chiropractic treatment and stretches. He presents to the ED with complaints of bilateral foot numbness.  The patient states that 2 days ago he was lifting a large guitar amplifier and feels that this likely was the beginning of his symptoms.  About 24 hours after he lifted the guitar amplifier, he began experiencing chest pain and stated that " it felt like my chest was going to collapse."  He presented to the ED due to the chest pain and was subsequently prescribed a muscle relaxer and anti-inflammatory and then discharged home.  He states that he was only able to take 1 dose of the anti-inflammatory and muscle relaxer.  Yesterday, he reports that his right foot began to go numb.  This then progressed into left foot numbness.  The numbness continued to travel up to his bilateral lower extremities.  Yesterday he reports that he was in the shower and fell due to bilateral lower extremity weakness.  Since yesterday his bilateral lower extremity paresthesia and weakness has progressed from his bilateral feet up to his bilateral lower extremities up to around the T6 distribution.  On arrival to the emergency department yesterday, he had bilateral lower extremity weakness but was able to move his legs.  This morning, he reports that he is unable to move his legs against gravity and feels that his symptoms are continuing to progress. ? ?Currently, he reports midline dorsalgia that he describes as sharp and reaches an 8 out of 10 in severity with movement.  He reports numbness and tingling sensation that begins around the T6 distribution and travels down both his lower extremities into his feet.  He also has muscle soreness in his back.  He also reports that he is on been unable to urinate since last night and is  unable to feel the urge to need to urinate. ? ?History reviewed. No pertinent past medical history. ? ?History reviewed. No pertinent surgical history. ? ?History reviewed. No pertinent family history. ?Social History:  reports that he has never smoked. He has never used smokeless tobacco. He reports current alcohol use. He reports that he does not currently use drugs. ? ?Allergies: No Known Allergies ? ?(Not in a hospital admission) ? ? ?Results for orders placed or performed during the hospital encounter of 09/03/21 (from the past 48 hour(s))  ?Basic metabolic panel     Status: Abnormal  ? Collection Time: 09/03/21  7:02 PM  ?Result Value Ref Range  ? Sodium 138 135 - 145 mmol/L  ? Potassium 4.0 3.5 - 5.1 mmol/L  ? Chloride 103 98 - 111 mmol/L  ? CO2 25 22 - 32 mmol/L  ? Glucose, Bld 104 (H) 70 - 99 mg/dL  ?  Comment: Glucose reference range applies only to samples taken after fasting for at least 8 hours.  ? BUN 14 6 - 20 mg/dL  ? Creatinine, Ser 1.17 0.61 - 1.24 mg/dL  ? Calcium 9.4 8.9 - 10.3 mg/dL  ? GFR, Estimated >60 >60 mL/min  ?  Comment: (NOTE) ?Calculated using the CKD-EPI Creatinine Equation (2021) ?  ? Anion gap 10 5 - 15  ?  Comment: Performed at [facility], [address] [place], [place] [address]  ?CBC     Status: Abnormal  ? Collection  Time: 09/03/21  7:02 PM  ?Result Value Ref Range  ? WBC 10.7 (H) 4.0 - 10.5 K/uL  ? RBC 4.19 (L) 4.22 - 5.81 MIL/uL  ? Hemoglobin 12.7 (L) 13.0 - 17.0 g/dL  ? HCT 39.6 39.0 - 52.0 %  ? MCV 94.5 80.0 - 100.0 fL  ? MCH 30.3 26.0 - 34.0 pg  ? MCHC 32.1 30.0 - 36.0 g/dL  ? RDW 13.5 11.5 - 15.5 %  ? Platelets 402 (H) 150 - 400 K/uL  ? nRBC 0.0 0.0 - 0.2 %  ?  Comment: Performed at [facility], [address], [place], [place] [address]  ?Troponin I (High Sensitivity)     Status: None  ? Collection Time: 09/03/21  7:02 PM  ?Result Value Ref Range  ? Troponin I (High Sensitivity) <2 <18 ng/L  ?  Comment: (NOTE) ?Elevated high  sensitivity troponin I (hsTnI) values and significant  ?changes across serial measurements may suggest ACS but many other  ?chronic and acute conditions are known to elevate hsTnI results.  ?Refer to the "Links" section for chest pain algorithms and additional  ?guidance. ?Performed at [facility], [address] ?[place], [place] [address] ?  ?POC CBG, ED     Status: None  ? Collection Time: 09/03/21  8:13 PM  ?Result Value Ref Range  ? Glucose-Capillary 80 70 - 99 mg/dL  ?  Comment: Glucose reference range applies only to samples taken after fasting for at least 8 hours.  ? ?CT Angio Chest PE W and/or Wo Contrast ? ?Result Date: 09/03/2021 ?CLINICAL DATA:  36 year old male with right side pleuritic chest pain. EXAM: CT ANGIOGRAPHY CHEST WITH CONTRAST TECHNIQUE: Multidetector CT imaging of the chest was performed using the standard protocol during bolus administration of intravenous contrast. Multiplanar CT image reconstructions and MIPs were obtained to evaluate the vascular anatomy. RADIATION DOSE REDUCTION: This exam was performed according to the departmental dose-optimization program which includes automated exposure control, adjustment of the mA and/or kV according to patient size and/or use of iterative reconstruction technique. CONTRAST:  153mL OMNIPAQUE IOHEXOL 350 MG/ML SOLN COMPARISON:  Portable chest 0306 hours. FINDINGS: Cardiovascular: Inadequate contrast bolus timing in the pulmonary arterial tree initially. Repeat contrast bolus at 0455 hours is only modestly improved but adequate. Less motion artifact on the repeat images also. No focal filling defect identified in the pulmonary arteries to suggest acute pulmonary embolism. No cardiomegaly or pericardial effusion. No calcified coronary artery atherosclerosis is evident. Negative visible aorta. Mediastinum/Nodes: Negative. No mediastinal mass or lymphadenopathy. Lungs/Pleura: Low lung volumes with streaky bilateral middle lobe  and lower lobe opacity most resembling atelectasis. Major airways are patent. No pleural effusion, consolidation, or convincing pulmonary inflammation. Upper Abdomen: Negative visible liver, gallbladder, spleen, pancreas, adrenal glands, kidneys, and bowel in the upper abdomen. Musculoskeletal: Negative. Review of the MIP images confirms the above findings. IMPRESSION: 1. Negative for acute pulmonary embolus. 2. Low lung volumes with streaky bilateral middle and lower lobe opacity most resembling atelectasis. Electronically Signed   By: Genevie Ann M.D.   On: 09/03/2021 05:12  ? ?MR THORACIC SPINE WO CONTRAST ? ?Addendum Date: 09/04/2021   ?ADDENDUM REPORT: 09/04/2021 07:36 ADDENDUM: Both the Thoracic and Lumbar MRI salient findings were discussed by telephone with Dr. Quintella Reichert on 09/04/2021 at 0723 hours. Electronically Signed   By: Genevie Ann M.D.   On: 09/04/2021 07:36  ? ?Result Date: 09/04/2021 ?CLINICAL DATA:  36 year old male with thoracolumbar back pain, bilateral lower extremity weakness  and numbness. Symptom onset after heavy lifting. EXAM: MRI THORACIC SPINE WITHOUT CONTRAST TECHNIQUE: Multiplanar, multisequence MR imaging of the thoracic spine was performed. No intravenous contrast was administered. COMPARISON:  CTA chest 09/03/2021. FINDINGS: Limited cervical spine imaging:  Limited, grossly negative. Thoracic spine segmentation: Appears normal on the chest CTA yesterday. Alignment:  Stable thoracic kyphosis.  No spondylolisthesis. Vertebrae: No marrow edema or evidence of acute osseous abnormality. Background bone marrow signal within normal limits. Cord: Pronounced thoracic epidural lipomatosis beginning at the T4 level and continuing through T10. This alone effaces CSF from the thecal sac at multiple levels. However, there is substantial exacerbation of spinal stenosis at the T6-T7 level (see details below). With spinal cord mass effect there. However, no spinal cord edema or myelomalacia are evident.  The conus medullaris is mostly visible at T12-L1 and appears normal. Paraspinal and other soft tissues: Negative. Disc levels: T1-T2: Negative. T2-T3: Negative. T3-T4: Negative. T4-T5: Mild thoracic epidural lip

## 2021-09-04 NOTE — Anesthesia Preprocedure Evaluation (Addendum)
Anesthesia Evaluation  ?Patient identified by MRN, date of birth, ID band ?Patient awake ? ? ? ?Reviewed: ?Allergy & Precautions, H&P , NPO status , Patient's Chart, lab work & pertinent test results, reviewed documented beta blocker date and time  ? ?Airway ?Mallampati: I ? ?TM Distance: >3 FB ?Neck ROM: full ? ? ? Dental ?no notable dental hx. ?(+) Teeth Intact, Dental Advisory Given ?  ?Pulmonary ?neg pulmonary ROS,  ?  ?Pulmonary exam normal ?breath sounds clear to auscultation ? ? ? ? ? ? Cardiovascular ?Exercise Tolerance: Good ?negative cardio ROS ? ? ?Rhythm:regular Rate:Normal ? ? ?  ?Neuro/Psych ?negative neurological ROS ? negative psych ROS  ? GI/Hepatic ?negative GI ROS, Neg liver ROS,   ?Endo/Other  ?negative endocrine ROS ? Renal/GU ?negative Renal ROS  ?negative genitourinary ?  ?Musculoskeletal ? ? Abdominal ?  ?Peds ? Hematology ?negative hematology ROS ?(+)   ?Anesthesia Other Findings ? ? Reproductive/Obstetrics ?negative OB ROS ? ?  ? ? ? ? ? ? ? ? ? ? ? ? ? ?  ?  ? ? ? ? ? ? ? ?Anesthesia Physical ?Anesthesia Plan ? ?ASA: 2 ? ?Anesthesia Plan: General  ? ?Post-op Pain Management: Tylenol PO (pre-op)*, Celebrex PO (pre-op)*, Dilaudid IV and Precedex  ? ?Induction: Intravenous ? ?PONV Risk Score and Plan: 2 and Ondansetron, Dexamethasone and Treatment may vary due to age or medical condition ? ?Airway Management Planned: Oral ETT ? ?Additional Equipment: None ? ?Intra-op Plan:  ? ?Post-operative Plan:  ? ?Informed Consent: I have reviewed the patients History and Physical, chart, labs and discussed the procedure including the risks, benefits and alternatives for the proposed anesthesia with the patient or authorized representative who has indicated his/her understanding and acceptance.  ? ? ? ?Dental Advisory Given ? ?Plan Discussed with: CRNA and Anesthesiologist ? ?Anesthesia Plan Comments:   ? ? ? ? ? ?Anesthesia Quick Evaluation ? ?

## 2021-09-04 NOTE — Transfer of Care (Signed)
Immediate Anesthesia Transfer of Care Note ? ?Patient: Cristian Grant ? ?Procedure(s) Performed: THORACIC FIVE, THORACIC SIX, THORACIC SEVEN LAMINECTOMY FOR DECOMPRESSION AND  RESECTION OF SYNOVIAL CYST (Spine Lumbar) ? ?Patient Location: PACU ? ?Anesthesia Type:General ? ?Level of Consciousness: oriented, drowsy, patient cooperative and responds to stimulation ? ?Airway & Oxygen Therapy: Patient Spontanous Breathing and Patient connected to face mask oxygen ? ?Post-op Assessment: Report given to RN, Post -op Vital signs reviewed and stable and Moving upper ext only ? ?Post vital signs: Reviewed and stable ? ?Last Vitals:  ?Vitals Value Taken Time  ?BP 156/85 09/04/21 1639  ?Temp    ?Pulse 111 09/04/21 1641  ?Resp 20 09/04/21 1641  ?SpO2 100 % 09/04/21 1641  ?Vitals shown include unvalidated device data. ? ?Last Pain:  ?Vitals:  ? 09/04/21 1233  ?TempSrc: Oral  ?PainSc:   ?   ? ?  ? ?Complications: No notable events documented. ?

## 2021-09-04 NOTE — ED Notes (Signed)
Provided pt urinal

## 2021-09-04 NOTE — ED Provider Notes (Signed)
I received the patient in signout from Dr. Ralene Bathe, briefly the patient is a 36 year old male with a chief complaint of thoracic back pain having weakness to his legs and paresthesias just below the umbilicus down.  He had 3 beats of clonus and had difficulty with raising his legs off the bed bilaterally.  MRI was obtained and shows an acute T6-7 cord compression.  Will discuss with neurosurgery. ? ?Neurosurgery to take to the OR. ?  Deno Etienne, DO ?09/04/21 1139 ? ?

## 2021-09-04 NOTE — Consult Note (Addendum)
? ?NAME:  Cristian Grant, MRN:  [id number], DOB:  25-May-1986, LOS: 0 ?ADMISSION DATE:  09/04/2021, CONSULTATION DATE:  3/31 ?REFERRING MD:  Dawley, REASON FOR CONSULT:  MAP goals  ? ?History of Present Illness:  ?Cristian Grant, is a 36 y.o. male, who presented to the [facility] on 3/30 with a chief complaint of thoracic back pain and bilateral lower extremity weakness which began after lifting a heavy object 2 days prior to presentation. ? 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  He was admitted to the neurosurgery service and taken to the operating room on 3/31, where he underwent a bilateral T5, T6, T7 laminectomy for decompression and a right T6/7 medial facetectomy. ? ?He was admitted to the [facility]. neurosurgical ICU postop with MAP goals greater than 80 for 4 days postop.  PCCM was consulted for assistance with MAP goals and ICU needs ? ?Pertinent  Medical History  ?HLD ? ?Significant Hospital Events: ?Including procedures, antibiotic start and stop dates in addition to other pertinent events   ?3/31 admit to NSGY, bilateral T5, T6, T7 laminectomy for decompression and a right T6/7 medial facetectomy  ? ?Interim History / Subjective:  ?See above ? ?Subjective: Denies chest pain, denies SOB, denies back pain, endorses improved sensation to BLLE ? ?Objective   ?Blood pressure 123/76, pulse 98, temperature 97.6 ?F (36.4 ?C), temperature source Axillary, resp. rate 19, height 6' 2" (1.88 m), weight 136.1 kg, SpO2 95 %. ?   ?   ? ?Intake/Output Summary (Last 24 hours) at 09/04/2021 2054 ?Last data filed at 09/04/2021 2024 ?Gross per 24 hour  ?Intake 1954.33 ml  ?Output 2330 ml  ?Net -375.67 ml  ? ?Filed Weights  ? 09/04/21 0925  ?Weight: 136.1 kg  ? ? ?Examination: ?General: In bed, NAD, appears comfortable ?HEENT: MM pink/moist, anicteric, atraumatic ?Neuro: RASS 0, PERRL 74m, GCS 15, sensation intact in lower  extremities, 5/5 BLUE, 1/5 LLE, 0/5 RLE ?CV: S1S2, NSR, no m/r/g appreciated ?PULM:  clear in the upper lobes, clear in the lower lobes, trachea midline, chest expansion symmetric ?GI: soft, bsx4 active, non-tender   ?Extremities: warm/dry, no pretibial edema, capillary refill less than 3 seconds  ?Skin:  no rashes or lesions noted ? ?labs ?CBC 3/30 WBC 10.7, Hgb 12.7, platelets 402 ?BMP 3/30 reviewed ?Chest x-ray 3/30: No pneumo, effusionm, or infiltrate noted ?Troponin WNL ? ?Resolved Hospital Problem list   ? ? 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 Discussed cased with ED pharmacist-adding ceftriaxone 2g q12h.  Follow up and narrow as cultures result.  ?-Multimodal pain control with as needed Tylenol, Dilaudid, Robaxin, oxycodone. On bowel regimen. Last BM per patient 3/28. ?-MAP goal greater than 80 per NSGY until POD 4. Will start peripheral vasopressors in the setting of MAP goals not being met. Currently meeting MAP goals without vasoactive support. ?-Neurochecks per unit protocol ?-Trend fever/WBC curve. Follow up AM CBC ?-SCD ? ?HLD ?-Continue crestor ? ?Best Practice (right click and "Reselect all SmartList Selections" daily)  ? ?Diet/type: Regular consistency (see orders) ?DVT prophylaxis: SCD ?GI prophylaxis: PPI ?Lines: N/A ?Foley:  N/A ?Code Status:  full code ?Last date  of multidisciplinary goals of care discussion [full scope] ? ?Labs   ?CBC: ?Recent Labs  ?Lab 09/03/21 ?0306 09/03/21 ?1902  ?WBC 8.0 10.7*  ?NEUTROABS 5.1  --   ?HGB 12.7* 12.7*  ?HCT 39.3 39.6  ?MCV 94.2 94.5  ?PLT 386 402*  ? ? ?Basic Metabolic Panel: ?Recent Labs  ?Lab 09/03/21 ?0306 09/03/21 ?1902  ?NA 139 138  ?K  3.8 4.0  ?CL 105 103  ?CO2 27 25  ?GLUCOSE 105* 104*  ?BUN 14 14  ?CREATININE 1.13 1.17  ?CALCIUM 9.3 9.4  ? ?GFR: ?Estimated Creatinine Clearance: 126.9 mL/min (by C-G formula based on SCr of 1.17 mg/dL). ?Recent Labs  ?Lab 09/03/21 ?0306 09/03/21 ?1902  ?WBC 8.0 10.7*  ? ? ?Liver Function Tests: ?No results for input(s): AST, ALT, ALKPHOS, BILITOT, PROT, ALBUMIN in the last 168 hours. ?No results for input(s): LIPASE, AMYLASE in the last 168 hours. ?No results for input(s): AMMONIA in the last 168 hours. ? ?ABG ?No results found for: PHART, PCO2ART, PO2ART, HCO3, TCO2, ACIDBASEDEF, O2SAT  ? ?Coagulation Profile: ?No results for input(s): INR, PROTIME in the last 168 hours. ? ?Cardiac Enzymes: ?No results for input(s): CKTOTAL, CKMB, CKMBINDEX, TROPONINI in the last 168 hours. ? ?HbA1C: ?No results found for: HGBA1C ? ?CBG: ?Recent Labs  ?Lab 09/03/21 ?2013  ?GLUCAP 80  ? ? ?Review of Systems:   ?Positives in bold ? ?Gen: fever, chills, weight change, fatigue, night sweats ?HEENT:  blurred vision, double vision, hearing loss, tinnitus, sinus congestion, rhinorrhea, sore throat, neck stiffness, dysphagia ?PULM:  shortness of breath, cough, sputum production, hemoptysis, wheezing ?CV: chest pain, edema, orthopnea, paroxysmal nocturnal dyspnea, palpitations ?GI:  abdominal pain, nausea, vomiting, diarrhea, hematochezia, melena, constipation, change in bowel habits ?GU: dysuria, hematuria, polyuria, oliguria, urethral discharge ?Endocrine: hot or cold intolerance, polyuria, polyphagia or appetite change ?Derm: rash, dry skin, scaling or peeling skin change ?Heme: easy bruising, bleeding, bleeding gums ?Neuro: headache, numbness, weakness, slurred speech, loss of memory or consciousness ? ?Past Medical History:  ?He,  has no past medical history on file.  ? ?Surgical History:  ?History reviewed. No pertinent surgical history.  ? ?Social History:  ? reports that he has never smoked. He has never used smokeless tobacco.  He reports current alcohol use. He reports that he does not currently use drugs.  ? ?Family History:  ?His family history is not on file.  ? ?Allergies ?No Known Allergies  ? ?Home Medications  ?Prior to Admission medications   ?Medication Sig Start Date End Date Taking? Authorizing Provider  ?methocarbamol (ROBAXIN) 500 MG tablet Take 1 tablet (500 mg total) by mouth 2 (two) times daily. 09/03/21  Yes Truddie Hidden, MD  ?naproxen (NAPROSYN) 500 MG tablet Take 1 tablet (500 mg total) by mouth 2 (two) times daily. 09/03/21  Yes Truddie Hidden, MD  ?rosuvastatin (CRESTOR) 10 MG tablet Take 10 mg by mouth daily. 05/17/21  Yes [provider]  ?hydrOXYzine (ATARAX) 25 MG tablet Take 1 tablet (25 mg total) by mouth every 6 (six) hours. 09/03/21   Tedd Sias, PA  ?  ? ?Critical care time: n/a ?  ? ?[employer]mond [employer]., MSN, APRN, AGACNP-BC ?[facility]  ?09/04/2021 , 9:33 PM ? ?Please see [email or web address] for pager details ? ?If no response, please call [phone] ?After hours, please call Elink at [phone] ? ? ? ? ?

## 2021-09-04 NOTE — Progress Notes (Signed)
An USGPIV (ultrasound guided PIV) has been placed for short-term vasopressor infusion. A correctly placed ivWatch must be used when administering Vasopressors. Should this treatment be needed beyond 72 hours, central line access should be obtained.  It will be the responsibility of the bedside nurse to follow best practice to prevent extravasations.   ?

## 2021-09-04 NOTE — ED Notes (Signed)
NT completed bladder scan 524 ml ?

## 2021-09-04 NOTE — Progress Notes (Signed)
? ?  Providing Compassionate, Quality Care - Together ? ?NEUROSURGERY PROGRESS NOTE ? ? ?S: Patient seen and examined in recovery ? ?O: EXAM:  ?BP 140/74 (BP Location: Right Wrist)   Pulse (!) 110   Temp (!) 97.5 ?F (36.4 ?C)   Resp 20   Ht 6\' 2"  (1.88 m)   Wt 136.1 kg   SpO2 94%   BMI 38.52 kg/m?  ? ?Awake, alert, oriented  ?PERRL ?Speech fluent, appropriate  ?CNs grossly intact  ?5/5 BUE ?RLE 0/5 ?LLE 0/5 except dorsiflexion/plantarflexion 1/5 ?Proprioception and sensory to light touch in the lower extremities improved compared to prior ? ?ASSESSMENT:  ?36 y.o. male with  ? 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 ?-Status post T5-7 laminectomy, resection of epidural lipomatosis, concern for epidural abscess ? ?PLAN: ?-ICU admission, maps greater than 80 goal ?-Pain control ?-Wife updated ?-We will consult infectious disease and CCM ? ? ? ?Thank you for allowing me to participate in this patient's care.  Please do not hesitate to call with questions or concerns. ? ? ?Anvika Gashi, DO ?Neurosurgeon ?[facility] ?Cell: [phone] ? ?

## 2021-09-04 NOTE — ED Notes (Signed)
Pt is in MRI when RN assumed responsibility of pt care.  ?

## 2021-09-04 NOTE — Progress Notes (Signed)
I discussed again with the patient and over the phone with his wife the findings of severe thoracic stenosis and his poor neurologic status. We discussed the plan to perform a T5-7 laminectomy and evacuation of lipomatosis and resection of synovial cyst at T6-7 where his stenosis is most severe. I answered all of their questions and discussed risks, benefits and expected outcomes. They verbalized understanding and agreed to proceed with intervention. Informed consent was obtained.  ? ? ? ?Brees Hounshell, DO ?Neurosurgeon ?[facility] ? ?

## 2021-09-04 NOTE — ED Notes (Signed)
Patient sent to MRI in no acute distress.  

## 2021-09-04 NOTE — Progress Notes (Signed)
eLink Physician-Brief Progress Note ?Patient Name: Cristian Grant ?DOB: 01-Apr-1986 ?MRN: [id number] ? ? ?Date of Service ? 09/04/2021  ?HPI/Events of Note ? Notified of MAPs not within goal.  ? ?36/M with T5-T7 stenosis, s/p T5-7 laminectomy, resection of epidural lipomatosis, and epidural abscess.  Goal MAP of 80 as per neurosurgery recommendations.    ?eICU Interventions ? Start on peripheral levophed to keep MAP >80.  ? ? ? ?Intervention Category ?Intermediate Interventions: Other: ? ?Larinda Buttery ?09/04/2021, 10:26 PM ?

## 2021-09-04 NOTE — ED Provider Notes (Signed)
?[facility] ?Provider Note ? ? ?CSN: [id number] ?Arrival date & time: 09/04/21  0131 ? ?  ? ?History ? ?Chief Complaint  ?Patient presents with  ? B/L leg pain and numbness  ? ? ?Ezequiel GanserChristopher Repire Mcconaughy is a 36 y.o. male. ? ?The history is provided by the patient and medical records.  ?Ezequiel GanserChristopher Repire Claytor is a 36 y.o. male who presents to the Emergency Department complaining of tingling.  He presents to the ED complaining of tingling in bilateral legs at 10pm.  Tingling is BLE, right worse than left.  It feels like his legs are asleep.  Tingling goes to his waist.  Lifted something on Tuesday and developed mid/right back pain on Wednesday.  Pain in back radiates to his right chest.   ? ?No fever.  Has muscle spasm in his chest when he takes a deep breath.  Has muscle soreness in his abdomen.  No N/V. No difficulty urinating.  No edema.   ? ?Has HPL.   ? ?No tobacco.  Occ alcohol.  No street drugs.   ? ?Parents with DM, mother with crohns.   ? ?No recent URI sxs or immunizations.   ?  ? ?Home Medications ?Prior to Admission medications   ?Medication Sig Start Date End Date Taking? Authorizing Provider  ?hydrOXYzine (ATARAX) 25 MG tablet Take 1 tablet (25 mg total) by mouth every 6 (six) hours. 09/03/21   Gailen ShelterFondaw, Wylder S, PA  ?methocarbamol (ROBAXIN) 500 MG tablet Take 1 tablet (500 mg total) by mouth 2 (two) times daily. 09/03/21   Pollyann SavoySheldon, Charles B, MD  ?naproxen (NAPROSYN) 500 MG tablet Take 1 tablet (500 mg total) by mouth 2 (two) times daily. 09/03/21   Pollyann SavoySheldon, Charles B, MD  ?   ? ?Allergies    ?Patient has no known allergies.   ? ?Review of Systems   ?Review of Systems  ?All other systems reviewed and are negative. ? ?Physical Exam ?Updated Vital Signs ?BP (!) 142/80 (BP Location: Right Arm)   Pulse (!) 101   Temp 99.4 ?F (37.4 ?C) (Oral)   Resp 20   SpO2 96%  ?Physical Exam ?Vitals and nursing note reviewed.  ?Constitutional:   ?   Appearance: He is well-developed.   ?HENT:  ?   Head: Normocephalic and atraumatic.  ?Cardiovascular:  ?   Rate and Rhythm: Regular rhythm. Tachycardia present.  ?   Heart sounds: No murmur heard. ?Pulmonary:  ?   Effort: Pulmonary effort is normal. No respiratory distress.  ?   Breath sounds: Normal breath sounds.  ?Abdominal:  ?   Palpations: Abdomen is soft.  ?   Tenderness: There is no abdominal tenderness. There is no guarding or rebound.  ?Musculoskeletal:     ?   General: No swelling or tenderness.  ?   Comments: 2+ DP pulses bilaterally.  TTP over mid thoracic back without bony stepoff.    ?Skin: ?   General: Skin is warm and dry.  ?Neurological:  ?   Mental Status: He is alert and oriented to person, place, and time.  ?   Comments: Sensation to light touch intact in all four extremities.  5/5 strength in BUE.  3/5 strength in LLE. 2/5 strength in RLE.  3beats clonus in bilateral ankles.  2+ patellar reflexes bilaterally. Equivocal babinski bilaterally.    ?Psychiatric:     ?   Behavior: Behavior normal.  ? ? ?ED Results / Procedures / Treatments   ?Labs ?(all  labs ordered are listed, but only abnormal results are displayed) ?Labs Reviewed - No data to display ? ?EKG ?None ? ?Radiology ?CT Angio Chest PE W and/or Wo Contrast ? ?Result Date: 09/03/2021 ?CLINICAL DATA:  36 year old male with right side pleuritic chest pain. EXAM: CT ANGIOGRAPHY CHEST WITH CONTRAST TECHNIQUE: Multidetector CT imaging of the chest was performed using the standard protocol during bolus administration of intravenous contrast. Multiplanar CT image reconstructions and MIPs were obtained to evaluate the vascular anatomy. RADIATION DOSE REDUCTION: This exam was performed according to the departmental dose-optimization program which includes automated exposure control, adjustment of the mA and/or kV according to patient size and/or use of iterative reconstruction technique. CONTRAST:  OMNIPAQUE IOHEXOL 350 MG/ML SOLN COMPARISON:  Portable chest 0306 hours.  FINDINGS: Cardiovascular: Inadequate contrast bolus timing in the pulmonary arterial tree initially. Repeat contrast bolus at 0455 hours is only modestly improved but adequate. Less motion artifact on the repeat images also. No focal filling defect identified in the pulmonary arteries to suggest acute pulmonary embolism. No cardiomegaly or pericardial effusion. No calcified coronary artery atherosclerosis is evident. Negative visible aorta. Mediastinum/Nodes: Negative. No mediastinal mass or lymphadenopathy. Lungs/Pleura: Low lung volumes with streaky bilateral middle lobe and lower lobe opacity most resembling atelectasis. Major airways are patent. No pleural effusion, consolidation, or convincing pulmonary inflammation. Upper Abdomen: Negative visible liver, gallbladder, spleen, pancreas, adrenal glands, kidneys, and bowel in the upper abdomen. Musculoskeletal: Negative. Review of the MIP images confirms the above findings. IMPRESSION: 1. Negative for acute pulmonary embolus. 2. Low lung volumes with streaky bilateral middle and lower lobe opacity most resembling atelectasis. Electronically Signed   By: Odessa Fleming M.D.   On: 09/03/2021 05:12  ? ?DG Chest Portable 1 View ? ?Result Date: 09/03/2021 ?CLINICAL DATA:  Chest pain and shortness of breath. EXAM: PORTABLE CHEST 1 VIEW COMPARISON:  Chest radiograph dated 09/03/2021. FINDINGS: Shallow inspiration. Bibasilar atelectasis/scarring. No focal consolidation, pleural effusion, pneumothorax. The cardiac silhouette is within normal limits. No acute osseous pathology. IMPRESSION: No active disease. Electronically Signed   By: Elgie Collard M.D.   On: 09/03/2021 19:22  ? ?DG Chest Port 1 View ? ?Result Date: 09/03/2021 ?CLINICAL DATA:  Pleuritic chest pain. EXAM: PORTABLE CHEST 1 VIEW COMPARISON:  None. FINDINGS: Shallow inspiration. Right lung base linear atelectasis/scarring. No focal consolidation, pleural effusion, pneumothorax. The cardiac silhouette is within  limits. Atherosclerotic calcification of the aorta. No acute osseous pathology. IMPRESSION: No active disease. Electronically Signed   By: Elgie Collard M.D.   On: 09/03/2021 03:20   ? ?Procedures ?Procedures  ? ? ?Medications Ordered in ED ?Medications - No data to display ? ?ED Course/ Medical Decision Making/ A&P ?  ?                        ?Medical Decision Making ?Amount and/or Complexity of Data Reviewed ?Radiology: ordered. ? ?Risk ?Prescription drug management. ? ? ?Patient here for evaluation of numbness to bilateral lower extremities that occurred around 10 PM.  He states that this started after he left the hospital where he was evaluated for right-sided chest pain.  He did have a lifting injury 2 days ago.  He has sensation intact to light touch in bilateral lower extremities but does have weakness, right greater than left.  Plan to obtain MRI to rule out acute cord compression.  Patient care transferred pending MRI. ? ? ? ? ? ? ? ?Final Clinical Impression(s) / ED Diagnoses ?Final  diagnoses:  ?None  ? ? ?Rx / DC Orders ?ED Discharge Orders   ? ? None  ? ?  ? ? ?  ?Tilden Fossa, MD ?09/04/21 0715 ? ?

## 2021-09-05 LAB — CBC
HCT: 34.6 % — ABNORMAL LOW (ref 39.0–52.0)
Hemoglobin: 11.5 g/dL — ABNORMAL LOW (ref 13.0–17.0)
MCH: 31.4 pg (ref 26.0–34.0)
MCHC: 33.2 g/dL (ref 30.0–36.0)
MCV: 94.5 fL (ref 80.0–100.0)
Platelets: 383 10*3/uL (ref 150–400)
RBC: 3.66 MIL/uL — ABNORMAL LOW (ref 4.22–5.81)
RDW: 13.2 % (ref 11.5–15.5)
WBC: 11.3 10*3/uL — ABNORMAL HIGH (ref 4.0–10.5)
nRBC: 0 % (ref 0.0–0.2)

## 2021-09-05 LAB — BASIC METABOLIC PANEL
Anion gap: 6 (ref 5–15)
BUN: 10 mg/dL (ref 6–20)
CO2: 24 mmol/L (ref 22–32)
Calcium: 8.5 mg/dL — ABNORMAL LOW (ref 8.9–10.3)
Chloride: 107 mmol/L (ref 98–111)
Creatinine, Ser: 0.92 mg/dL (ref 0.61–1.24)
GFR, Estimated: >60 mL/min (ref >60)
Glucose, Bld: 149 mg/dL — ABNORMAL HIGH (ref 70–99)
Potassium: 4.2 mmol/L (ref 3.5–5.1)
Sodium: 137 mmol/L (ref 135–145)

## 2021-09-05 LAB — MAGNESIUM: Magnesium: 2.2 mg/dL (ref 1.7–2.4)

## 2021-09-05 MED ORDER — VANCOMYCIN HCL 1250 MG/250ML IV SOLN
1250.0000 mg | Freq: Three times a day (TID) | INTRAVENOUS | Status: DC
  Administered 2021-09-05 – 2021-09-07 (×5): 1250 mg via INTRAVENOUS
  Filled 2021-09-05 (×6): qty 250

## 2021-09-05 NOTE — Progress Notes (Signed)
Inpatient Rehab Admissions Coordinator Note:  ? ?Per PT patient was screened for CIR candidacy by Sherlene Rickel Luvenia Starch, CCC-SLP. At this time, pt appears to be a potential candidate for CIR. I will place an order for rehab consult for full assessment, per our protocol.  Please contact me any with questions.. ? ? ?Wolfgang Phoenix, MS, CCC-SLP ?Admissions Coordinator ?[phone] ?09/05/21 ?4:33 PM ? ?

## 2021-09-05 NOTE — Evaluation (Signed)
Physical Therapy Evaluation ?Patient Details ?Name: Cristian Grant ?MRN: [id number] ?DOB: Jan 03, 1986 ?Today's Date: 09/05/2021 ? ?History of Present Illness ? Pt is a 36 y.o. male who presented 09/03/21 with chest and upper back pain after lifting a heavy amplifier 2 days prior. Pt also with lower extremity weakness and numbness. 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 S/p T5-7 laminectomy, resection of epidural lipomatosis, and resection of synovial cyst for decompression 3/31. PMH: HLD ?  ?Clinical Impression ? Pt presents with condition above and deficits mentioned below, see PT Problem List. PTA, he was IND without DME, driving, working as a high school band Secondary school teacher, and living with his wife and children in a 2-level house with a level entry. Pt reports he could stay on the main level of the house if needed, but the shower is upstairs. Currently, pt displays deficits in balance and core control along with significant deficits in lower extremity strength and sensation bil. His R leg is significantly weaker and with more sensory deficits than his L. Pt is very motivated to participate and works hard to achieve any task. Pt required maxA to roll, maxAx2 to transition sidelying > sit EOB, and maxAx2 to come to stand from an elevated surface using the stedy. Notified RN that nursing should only do hoyer lifts with pt at this time. Considering his young age, significant change in functional status, ability to withstand 3 hrs of therapy/day, and motivation to participate and improve, recommending pt get intensive therapy at the AIR setting. Will continue to follow acutely. ?   ? ?Recommendations for follow up therapy are one component of a multi-disciplinary discharge planning process, led by the attending physician.  Recommendations may be updated based on patient status, additional functional criteria and insurance authorization. ? ?Follow Up  Recommendations Acute inpatient rehab (3hours/day) ? ?  ?Assistance Recommended at Discharge Intermittent Supervision/Assistance  ?Patient can return home with the following ? A lot of help with walking and/or transfers;Two people to help with walking and/or transfers;A lot of help with bathing/dressing/bathroom;Assistance with cooking/housework;Assist for transportation;Help with stairs or ramp for entrance ? ?  ?Equipment Recommendations Rolling walker (2 wheels);BSC/3in1;Wheelchair (measurements PT);Wheelchair cushion (measurements PT);Hospital bed;Other (comment) (bariatric size; hoyer lift; pending progress)  ?Recommendations for Other Services ? Rehab consult  ?  ?Functional Status Assessment Patient has had a recent decline in their functional status and demonstrates the ability to make significant improvements in function in a reasonable and predictable amount of time.  ? ?  ?Precautions / Restrictions Precautions ?Precautions: Fall;Back ?Precaution Booklet Issued: No ?Precaution Comments: reviewed spinal precautions; hemovac to back ?Required Braces or Orthoses:  (no brace needed) ?Restrictions ?Weight Bearing Restrictions: No  ? ?  ? ?Mobility ? Bed Mobility ?Overal bed mobility: Needs Assistance ?Bed Mobility: Rolling, Sidelying to Sit ?Rolling: Max assist ?Sidelying to sit: Max assist, +2 for physical assistance, +2 for safety/equipment, HOB elevated ?  ?  ?  ?General bed mobility comments: Cues to log roll, needing maxA to flex R leg and rotate hips/legs to roll to L using bed rail. MaxAx2 to manage legs and trunk to transition sidelying to sit EOB with HOB elevated. ?  ? ?Transfers ?Overall transfer level: Needs assistance ?Equipment used: Ambulation equipment used ?Transfers: Sit to/from Stand ?Sit to Stand: Max assist, +2 physical assistance, +2 safety/equipment, From elevated surface ?  ?  ?  ?  ?  ?General transfer comment: Pt coming to stand pulling up in  stedy 1x from elevated EOB and 1x from  stedy flaps, needing maxAx2 uding bed pad to boost buttocks and extend hips to stand. Extra time and effort to pull and push through UEs to get upright posture. ?  ? ?Ambulation/Gait ?  ?  ?  ?  ?  ?  ?  ?General Gait Details: unable ? ?Stairs ?  ?  ?  ?  ?  ? ?Wheelchair Mobility ?  ? ?Modified Rankin (Stroke Patients Only) ?  ? ?  ? ?Balance Overall balance assessment: Needs assistance ?Sitting-balance support: Single extremity supported, Bilateral upper extremity supported, Feet supported ?Sitting balance-Leahy Scale: Poor ?Sitting balance - Comments: Pt with UE support on bed and moments of LOB needing assistance to recover. ?  ?Standing balance support: Bilateral upper extremity supported, Reliant on assistive device for balance ?Standing balance-Leahy Scale: Poor ?Standing balance comment: ModAx2 to maintain static standing in stedy for <20 seconds. ?  ?  ?  ?  ?  ?  ?  ?  ?  ?  ?  ?   ? ? ? ?Pertinent Vitals/Pain Pain Assessment ?Pain Assessment: Faces ?Faces Pain Scale: Hurts little more ?Pain Location: upper R back, feet tingling ?Pain Descriptors / Indicators: Discomfort, Grimacing, Operative site guarding, Tingling ?Pain Intervention(s): Limited activity within patient's tolerance, Monitored during session, Repositioned  ? ? ?Home Living Family/patient expects to be discharged to:: Private residence ?Living Arrangements: Spouse/significant other;Children ?Available Help at Discharge: Family;Available 24 hours/day (wife works from home) ?Type of Home: House ?Home Access: Level entry ?  ?  ?Alternate Level Stairs-Number of Steps: flight ?Home Layout: Two level;Able to live on main level with bedroom/bathroom;Bed/bath upstairs ?Home Equipment: None ?   ?  ?Prior Function Prior Level of Function : Independent/Modified Independent;Driving;Working/employed ?  ?  ?  ?  ?  ?  ?  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 ?  ? ? ?Hand Dominance  ?   ? ?  ?Extremity/Trunk Assessment   ? Upper Extremity Assessment ?Upper Extremity Assessment: Defer to OT evaluation ?  ? ?Lower Extremity Assessment ?Lower Extremity Assessment: RLE deficits/detail;LLE deficits/detail ?RLE Deficits / Details: MMT scores of 1 hip flexion, 2+ knee extension, 0 anterior tibialis and gastrocs; pt wiggles toes slightly though; reports numbness/tingling throughout leg (R more numb than L) ?RLE Sensation: decreased light touch ?LLE Deficits / Details: MMT scores of </= 2 hip flexion, 3+ knee extension, 3+ ankle dorsiflexion; reports numbness/tingling throughout leg (R more numb than L) ?LLE Sensation: decreased light touch ?  ? ?Cervical / Trunk Assessment ?Cervical / Trunk Assessment: Other exceptions ?Cervical / Trunk Exceptions: increased body habitus  ?Communication  ? Communication: No difficulties  ?Cognition Arousal/Alertness: Awake/alert ?Behavior During Therapy: [facility] for tasks assessed/performed, Anxious ?Overall Cognitive Status: Within Functional Limits for tasks assessed ?  ?  ?  ?  ?  ?  ?  ?  ?  ?  ?  ?  ?  ?  ?  ?  ?General Comments: Pt reporting some anxiety in regards to mobility, but overall WFL. ?  ?  ? ?  ?General Comments General comments (skin integrity, edema, etc.): VSS on RA ? ?  ?Exercises General Exercises - Lower Extremity ?Long Arc Quad: AROM, Right, 10 reps, Seated, AAROM (sometimes needing AAROM) ?Hip Flexion/Marching: AAROM, Right, 10 reps, Seated  ? ?Assessment/Plan  ?  ?PT Assessment Patient needs continued PT services  ?PT Problem List Decreased strength;Decreased activity tolerance;Decreased balance;Decreased mobility;Decreased  coordination;Decreased knowledge of use of DME;Decreased knowledge of precautions;Impaired sensation;Obesity;Pain ? ?   ?  ?PT Treatment Interventions DME instruction;Gait training;Stair training;Functional mobility training;Therapeutic activities;Therapeutic exercise;Balance training;Neuromuscular re-education;Patient/family education;Wheelchair mobility training    ? ?PT Goals (Current goals can be found in the Care Plan section)  ?Acute Rehab PT Goals ?Patient Stated Goal: to get better ?PT Goal Formulation: With patient ?Time For Goal Achievement: 09/19/21 ?Potent

## 2021-09-05 NOTE — Anesthesia Postprocedure Evaluation (Signed)
Anesthesia Post Note ? ?Patient: Cristian Grant ? ?Procedure(s) Performed: THORACIC FIVE, THORACIC SIX, THORACIC SEVEN LAMINECTOMY FOR DECOMPRESSION AND  RESECTION OF SYNOVIAL CYST (Spine Lumbar) ? ?  ? ?Anesthesia Type: General ?Anesthetic complications: no ? ? ?No notable events documented. ? ?Last Vitals:  ?Vitals:  ? 09/05/21 0800 09/05/21 0900  ?BP: 122/74 118/72  ?Pulse: 92 81  ?Resp: (!) 22 13  ?Temp: 36.8 ?C   ?SpO2: 97% 97%  ?  ?Last Pain:  ?Vitals:  ? 09/05/21 0800  ?TempSrc: Oral  ?PainSc: 0-No pain  ? ? ?  ?  ?  ?  ?  ?  ? ?[name] ? ? ? ? ?

## 2021-09-05 NOTE — Progress Notes (Signed)
? ?  Providing Compassionate, Quality Care - Together ? ?NEUROSURGERY PROGRESS NOTE ? ? ?S: No issues overnight.  Notes significant improvement in his numbness and tingling, as well as his motor function ? ?O: EXAM:  ?BP 118/72   Pulse 81   Temp 98.2 ?F (36.8 ?C) (Oral)   Resp 13   Ht 6\' 2"  (1.88 m)   Wt 136.1 kg   SpO2 97%   BMI 38.52 kg/m?  ? ?Awake, alert, oriented x3 ?PERRL ?Speech fluent, appropriate  ?CNs grossly intact  ?5/5 BUE ?Left lower extremity 2/5 proximally, 3/5 plantarflexion, 2/5 dorsiflexion ?Right lower extremity 2/5 proximally, 1/5 DF/PF ?Sensory decreased to light touch in upper thighs to toes ? ?ASSESSMENT:  ?36 y.o. male with  ? 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  ?-Status post T5-7 laminectomy, resection of epidural lipomatosis, concern for epidural abscess ?  ?PLAN: ?-Continue MAP goals greater than 80 x 7 days ?-Pain control ?-DVT prophylaxis with subcu heparin ?-Has already made some significant neurologic improvement compared to prior, continue rehab evaluation, will likely need inpatient rehab ?-We will consult infectious disease for evaluation and treatment of possible epidural abscess ? ? ?Thank you for allowing me to participate in this patient's care.  Please do not hesitate to call with questions or concerns. ? ? ?Parley Pidcock, DO ?Neurosurgeon ?[facility] ?Cell: [phone] ? ?

## 2021-09-05 NOTE — Progress Notes (Signed)
Pharmacy Antibiotic Note ? ?Cristian Grant is a 35 y.o. male admitted on 09/04/2021 with possible  epidural abscess .  Pharmacy has been consulted for vancomycin dosing. Also on ceftriaxone per MD. SCr improved to 0.92 today. Cultures negative to date. ? ?Plan: ?Adjust vancomycin to 1250mg  IV q8h (using nomogram dosing with epidural abscess) ?Ceftriaxone 2g IV q12h ?Monitor clinical progress, c/s, renal function ?F/u de-escalation plan/LOT, vancomycin trough at steady state ? ? ?Height: 6\' 2"  (188 cm) ?Weight: 136.1 kg (300 lb) ?IBW/kg (Calculated) : 82.2 ? ?Temp (24hrs), Avg:98.5 ?F (36.9 ?C), Min:97.5 ?F (36.4 ?C), Max:100 ?F (37.8 ?C) ? ?Recent Labs  ?Lab 09/03/21 ?0306 09/03/21 ?1902 09/05/21 ?0413  ?WBC 8.0 10.7* 11.3*  ?CREATININE 1.13 1.17 0.92  ? ?  ?Estimated Creatinine Clearance: 161.4 mL/min (by C-G formula based on SCr of 0.92 mg/dL).   ? ?No Known Allergies ? ? ?09/05/21, PharmD, BCPS ?Please check AMION for all [facility] contact numbers ?Clinical Pharmacist ?09/05/2021 11:55 AM ?

## 2021-09-05 NOTE — Evaluation (Signed)
Occupational Therapy Evaluation Patient Details Name: Cristian Grant MRN: [id number] DOB: Jan 26, 1986 Today's Date: 09/05/2021   History of Present Illness Pt is a 36 y.o. male who presented 09/03/21 with chest and upper back pain after lifting a heavy amplifier 2 days prior. Pt also with lower extremity weakness and numbness. 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 S/p T5-7 laminectomy, resection of epidural lipomatosis, and resection of synovial cyst for decompression 3/31. PMH: HLD   Clinical Impression   Pt admitted with above. He demonstrates the below listed deficits and will benefit from continued OT to maximize safety and independence with BADLs.  Pt presents to OT with paraparesis, imparied balance, decreased activity tolerance, increased pain.  He currently requires set up - mod A for UB ADLs EOB, and max - total A for LB ADLs.  Pt required max A +2 for bed mobility and sit to stand.  PTA, pt lived alone and was independent with all ADLs and IADLs.  Recommend AIR.        Recommendations for follow up therapy are one component of a multi-disciplinary discharge planning process, led by the attending physician.  Recommendations may be updated based on patient status, additional functional criteria and insurance authorization.   Follow Up Recommendations  Acute inpatient rehab (3hours/day)    Assistance Recommended at Discharge Frequent or constant Supervision/Assistance  Patient can return home with the following Two people to help with walking and/or transfers;Two people to help with bathing/dressing/bathroom;Assistance with cooking/housework;Help with stairs or ramp for entrance;Assist for transportation    Functional Status Assessment  Patient has had a recent decline in their functional status and demonstrates the ability to make significant improvements in function in a reasonable and predictable amount of time.   Equipment Recommendations  None recommended by OT    Recommendations for Other Services Rehab consult     Precautions / Restrictions Precautions Precautions: Fall;Back Precaution Booklet Issued: No Precaution Comments: reviewed spinal precautions; hemovac to back Required Braces or Orthoses:  (no brace needed) Restrictions Weight Bearing Restrictions: No      Mobility Bed Mobility Overal bed mobility: Needs Assistance Bed Mobility: Rolling, Sidelying to Sit Rolling: Max assist Sidelying to sit: Max assist, +2 for physical assistance, +2 for safety/equipment, HOB elevated       General bed mobility comments: Cues to log roll, needing maxA to flex R leg and rotate hips/legs to roll to L using bed rail. MaxAx2 to manage legs and trunk to transition sidelying to sit EOB with HOB elevated.    Transfers Overall transfer level: Needs assistance Equipment used: Ambulation equipment used Transfers: Sit to/from Stand Sit to Stand: Max assist, +2 physical assistance, +2 safety/equipment, From elevated surface           General transfer comment: Pt coming to stand pulling up in stedy 1x from elevated EOB and 1x from stedy flaps, needing maxAx2 uding bed pad to boost buttocks and extend hips to stand. Extra time and effort to pull and push through UEs to get upright posture.      Balance Overall balance assessment: Needs assistance Sitting-balance support: Single extremity supported, Bilateral upper extremity supported, Feet supported Sitting balance-Leahy Scale: Poor Sitting balance - Comments: Pt with UE support on bed and moments of LOB needing assistance to recover.   Standing balance support: Bilateral upper extremity supported, Reliant on assistive device for balance Standing balance-Leahy Scale: Poor Standing balance comment: ModAx2 to maintain static standing in  stedy for <20 seconds.                           ADL either performed or assessed with clinical  judgement   ADL Overall ADL's : Needs assistance/impaired Eating/Feeding: Independent   Grooming: Wash/dry hands;Wash/dry face;Oral care;Brushing hair;Moderate assistance;Sitting (EOB) Grooming Details (indicate cue type and reason): requires mod A for dynamic sitting balance EOB Upper Body Bathing: Moderate assistance;Sitting Upper Body Bathing Details (indicate cue type and reason): requires mod A for dynamic sitting balance EOB Lower Body Bathing: Total assistance;Bed level   Upper Body Dressing : Maximal assistance;Sitting   Lower Body Dressing: Total assistance;Bed level   Toilet Transfer: Maximal assistance;+2 for physical assistance;+2 for safety/equipment;BSC/3in1 (stedy)   Toileting- Clothing Manipulation and Hygiene: Total assistance;Sit to/from stand;Bed level       Functional mobility during ADLs: Maximal assistance;+2 for physical assistance;+2 for safety/equipment (stedy)       Vision Patient Visual Report: No change from baseline       Perception     Praxis      Pertinent Vitals/Pain Pain Assessment Pain Assessment: Faces Faces Pain Scale: Hurts little more Pain Location: upper R back, feet tingling Pain Descriptors / Indicators: Discomfort, Grimacing, Operative site guarding, Tingling Pain Intervention(s): Monitored during session, Limited activity within patient's tolerance, Repositioned     Hand Dominance Right   Extremity/Trunk Assessment Upper Extremity Assessment Upper Extremity Assessment: Overall WFL for tasks assessed   Lower Extremity Assessment Lower Extremity Assessment: Defer to PT evaluation RLE Deficits / Details: MMT scores of 1 hip flexion, 2+ knee extension, 0 anterior tibialis and gastrocs; pt wiggles toes slightly though; reports numbness/tingling throughout leg (R more numb than L) RLE Sensation: decreased light touch LLE Deficits / Details: MMT scores of </= 2 hip flexion, 3+ knee extension, 3+ ankle dorsiflexion; reports  numbness/tingling throughout leg (R more numb than L) LLE Sensation: decreased light touch   Cervical / Trunk Assessment Cervical / Trunk Assessment: Other exceptions Cervical / Trunk Exceptions: increased body habitus   Communication Communication Communication: No difficulties   Cognition Arousal/Alertness: Awake/alert Behavior During Therapy: WFL for tasks assessed/performed, Anxious Overall Cognitive Status: Within Functional Limits for tasks assessed                                 General Comments: Pt reporting some anxiety in regards to mobility, but overall WFL.     General Comments  VSS on RA    Exercises General Exercises - Lower Extremity Long Arc Quad:  (sometimes needing AAROM)   Shoulder Instructions      Home Living Family/patient expects to be discharged to:: Private residence Living Arrangements: Spouse/significant other;Children Available Help at Discharge: Family;Available 24 hours/day (wife works from home) Type of Home: House Home Access: Level entry     Home Layout: Two level;Able to live on main level with bedroom/bathroom;Bed/bath upstairs Alternate Level Stairs-Number of Steps: flight   Bathroom Shower/Tub: Producer, television/film/video: Standard     Home Equipment: None          Prior Functioning/Environment Prior Level of Function : Independent/Modified Independent;Driving;Working/employed               ADLs Comments: 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        OT Problem List: Decreased strength;Decreased activity tolerance;Impaired balance (sitting and/or standing);Decreased knowledge of  use of DME or AE;Decreased knowledge of precautions;Obesity;Pain      OT Treatment/Interventions: Self-care/ADL training;DME and/or AE instruction;Therapeutic activities;Patient/family education;Balance training;Therapeutic exercise    OT Goals(Current goals can be found in the care plan section)  Acute Rehab OT Goals Patient Stated Goal: for Rt LE to get stronger OT Goal Formulation: With patient Time For Goal Achievement: 09/19/21 Potential to Achieve Goals: Good ADL Goals Pt Will Perform Grooming: with supervision;sitting Pt Will Perform Upper Body Bathing: with set-up;sitting Pt Will Perform Lower Body Bathing: with mod assist;with adaptive equipment;sit to/from stand Pt Will Perform Upper Body Dressing: with supervision;sitting Pt Will Perform Lower Body Dressing: with mod assist;sit to/from stand;with adaptive equipment Pt Will Transfer to Toilet: with mod assist;stand pivot transfer;bedside commode Pt Will Perform Toileting - Clothing Manipulation and hygiene: with mod assist;sit to/from stand  OT Frequency: Min 2X/week    Co-evaluation PT/OT/SLP Co-Evaluation/Treatment: Yes Reason for Co-Treatment: For patient/therapist safety;To address functional/ADL transfers PT goals addressed during session: Mobility/safety with mobility OT goals addressed during session: ADL's and self-care      AM-PAC OT "6 Clicks" Daily Activity     Outcome Measure Help from another person eating meals?: None Help from another person taking care of personal grooming?: A Lot Help from another person toileting, which includes using toliet, bedpan, or urinal?: A Lot Help from another person bathing (including washing, rinsing, drying)?: A Lot Help from another person to put on and taking off regular upper body clothing?: A Lot Help from another person to put on and taking off regular lower body clothing?: Total 6 Click Score: 13   End of Session Equipment Utilized During Treatment: Gait belt;Other (comment) (stedy) Nurse Communication: Mobility status;Need for lift equipment  Activity Tolerance: Patient tolerated treatment well Patient left: in bed;with call bell/phone within reach  OT Visit Diagnosis: Unsteadiness on feet (R26.81);Pain;Muscle weakness (generalized) (M62.81) Pain - part of  body:  (back)                Time: 5366-4403 OT Time Calculation (min): 29 min Charges:  OT General Charges $OT Visit: 1 Visit OT Evaluation $OT Eval Moderate Complexity: 1 Mod  Eber Jones., OTR/L Acute Rehabilitation Services Pager [phone] Office [phone]   Jeani Hawking M 09/05/2021, 4:50 PM

## 2021-09-06 ENCOUNTER — Inpatient Hospital Stay: Payer: Self-pay

## 2021-09-06 ENCOUNTER — Inpatient Hospital Stay (HOSPITAL_COMMUNITY): Payer: BC Managed Care – PPO

## 2021-09-06 ENCOUNTER — Encounter (HOSPITAL_COMMUNITY): Payer: Self-pay | Admitting: Neurological Surgery

## 2021-09-06 DIAGNOSIS — G062 Extradural and subdural abscess, unspecified: Secondary | ICD-10-CM | POA: Diagnosis not present

## 2021-09-06 DIAGNOSIS — M4714 Other spondylosis with myelopathy, thoracic region: Secondary | ICD-10-CM

## 2021-09-06 DIAGNOSIS — G9520 Unspecified cord compression: Secondary | ICD-10-CM | POA: Diagnosis not present

## 2021-09-06 DIAGNOSIS — A4901 Methicillin susceptible Staphylococcus aureus infection, unspecified site: Secondary | ICD-10-CM | POA: Diagnosis not present

## 2021-09-06 DIAGNOSIS — E7800 Pure hypercholesterolemia, unspecified: Secondary | ICD-10-CM | POA: Diagnosis present

## 2021-09-06 LAB — BASIC METABOLIC PANEL
Anion gap: 8 (ref 5–15)
Anion gap: 8 (ref 5–15)
BUN: 8 mg/dL (ref 6–20)
BUN: <5 mg/dL — ABNORMAL LOW (ref 6–20)
CO2: 24 mmol/L (ref 22–32)
CO2: 26 mmol/L (ref 22–32)
Calcium: 8.2 mg/dL — ABNORMAL LOW (ref 8.9–10.3)
Calcium: 8.2 mg/dL — ABNORMAL LOW (ref 8.9–10.3)
Chloride: 105 mmol/L (ref 98–111)
Chloride: 102 mmol/L (ref 98–111)
Creatinine, Ser: 0.82 mg/dL (ref 0.61–1.24)
Creatinine, Ser: 0.77 mg/dL (ref 0.61–1.24)
GFR, Estimated: >60 mL/min (ref >60)
GFR, Estimated: >60 mL/min (ref >60)
Glucose, Bld: 148 mg/dL — ABNORMAL HIGH (ref 70–99)
Glucose, Bld: 142 mg/dL — ABNORMAL HIGH (ref 70–99)
Potassium: 3.4 mmol/L — ABNORMAL LOW (ref 3.5–5.1)
Potassium: 3 mmol/L — ABNORMAL LOW (ref 3.5–5.1)
Sodium: 137 mmol/L (ref 135–145)
Sodium: 136 mmol/L (ref 135–145)

## 2021-09-06 IMAGING — MR MR THORACIC SPINE WO/W CM
6 of 10 series · 18 of 48 positions shown · IV contrast (gadavist)
Comparison: MRI thoracic spine [DATE]

CLINICAL DATA: Osteomyelitis, thoracic epidural abscess

EXAM:
MRI THORACIC WITHOUT AND WITH CONTRAST
TECHNIQUE: Multiplanar and multiecho pulse sequences of the thoracic spine were
obtained without and with intravenous contrast.
CONTRAST:  10mL GADAVIST GADOBUTROL 1 MMOL/ML IV SOLN

[Series 2: T1 · sagittal · 3.0mm · 0.90mm/px · 2 of 14 slices shown (1 of 3)]
[im 1/14]
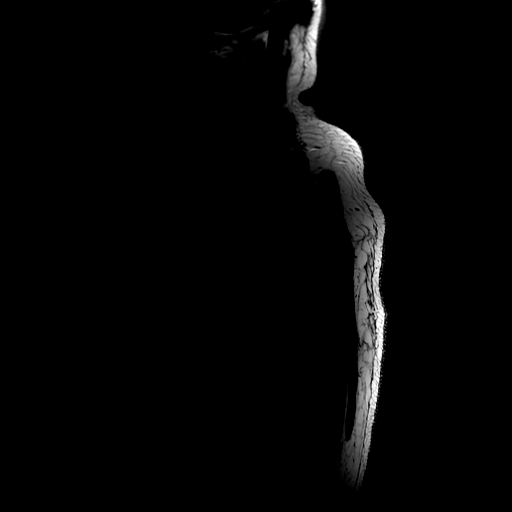
[im 14/14]
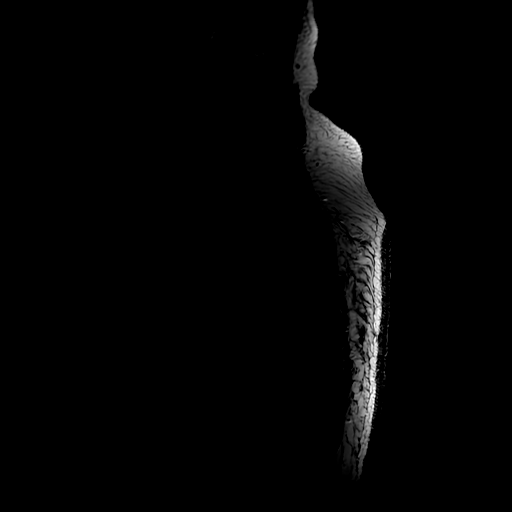

[Series 3: T2 · sagittal · 3.0mm · 0.66mm/px · 2 of 17 slices shown (1 of 2)]
[im 1/17]
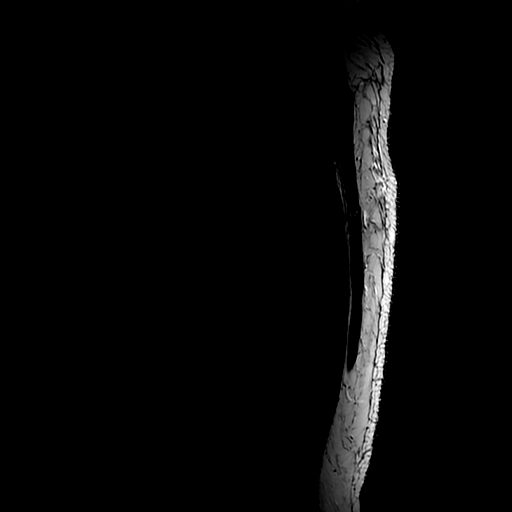
[im 17/17]
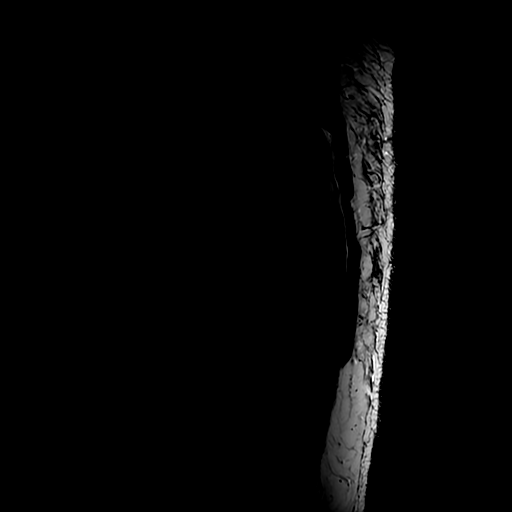

[Series 5: T1 · sagittal · 3.0mm · 0.66mm/px · 2 of 17 slices shown (2 of 3)]
[im 1/17]
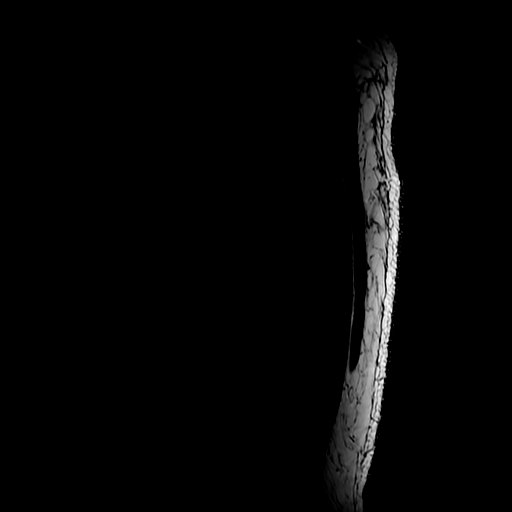
[im 17/17]
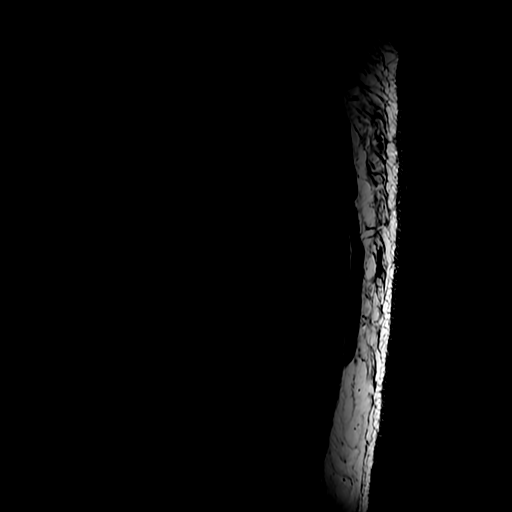

[Series 6: T2 · axial · 4.0mm · 0.39mm/px · z∈[-314,-8]mm · 9 of 63 slices shown (2 of 2)]
[im 1/63]
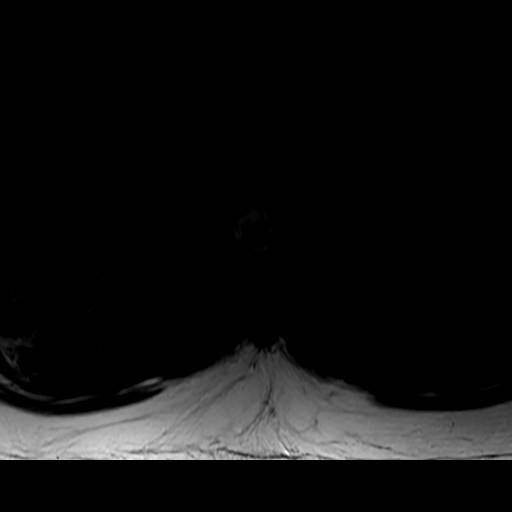
[im 8/63]
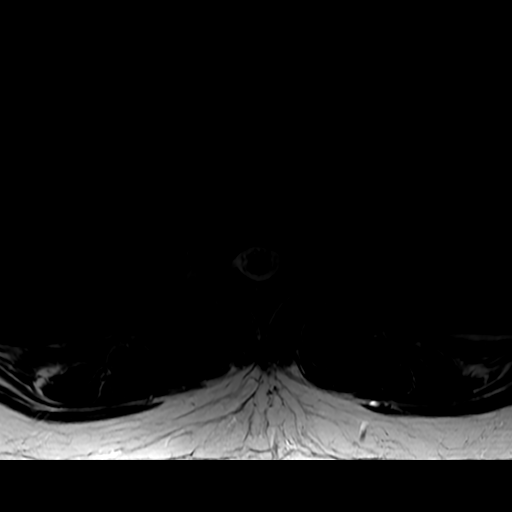
[im 16/63]
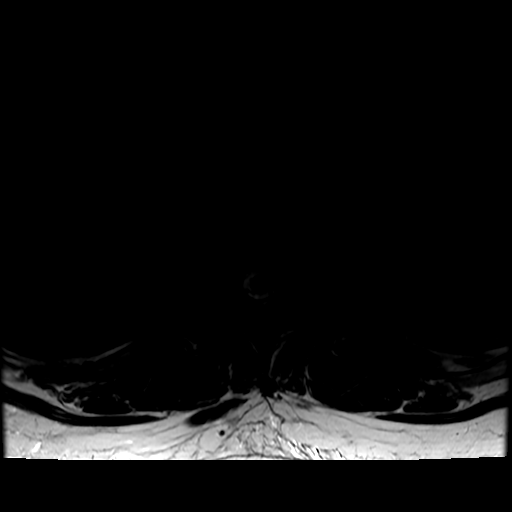
[im 24/63]
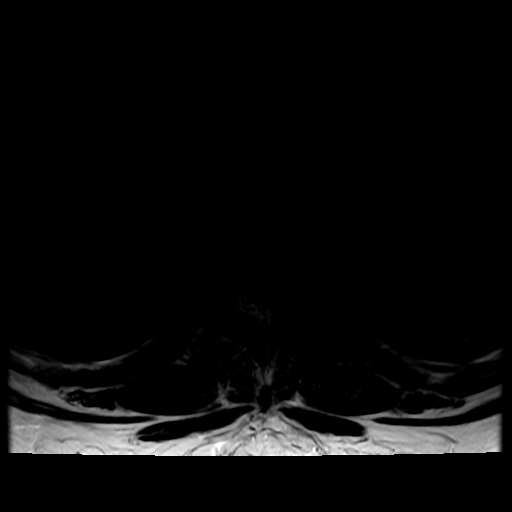
[im 32/63]
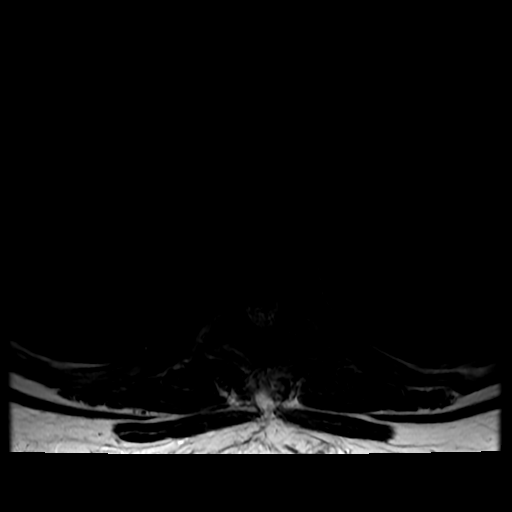
[im 39/63]
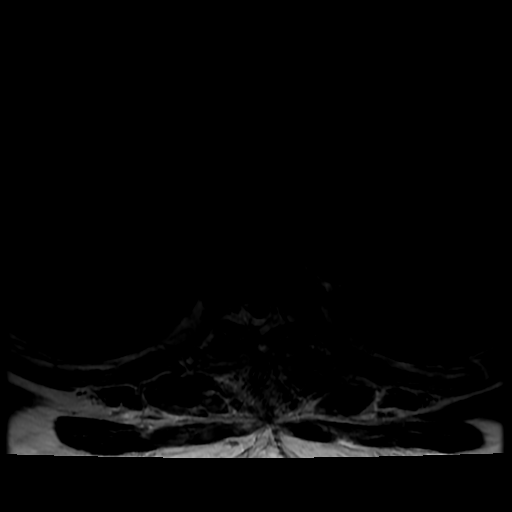
[im 47/63]
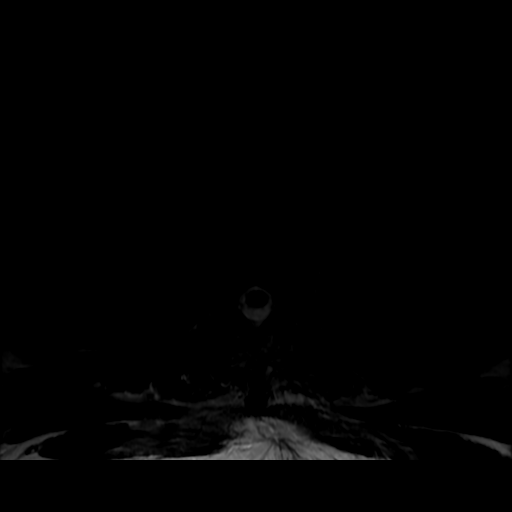
[im 55/63]
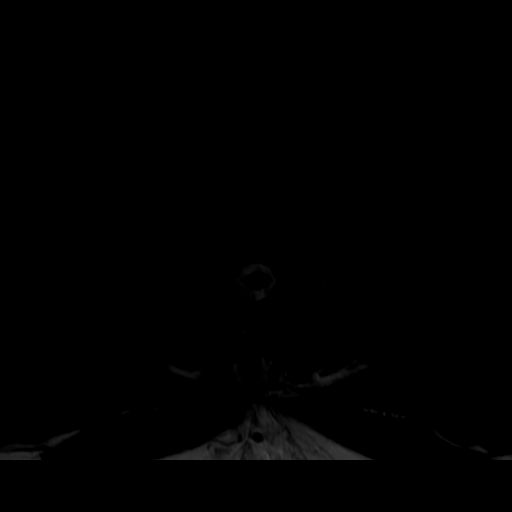
[im 63/63]
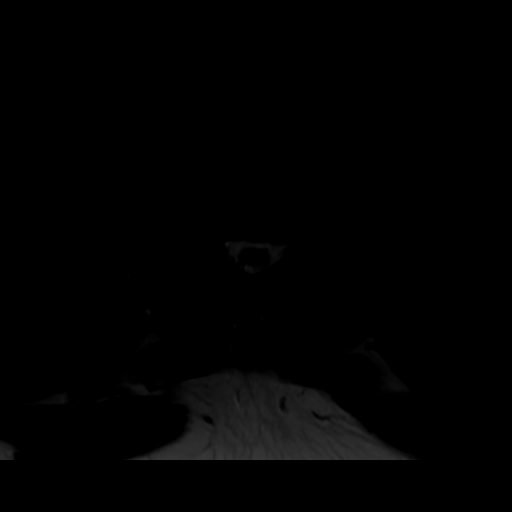

[Series 8: T1 · axial · non-contrast · 4.0mm · 0.39mm/px · 1 of 63 slices shown (3 of 3)]
[im 1/63]
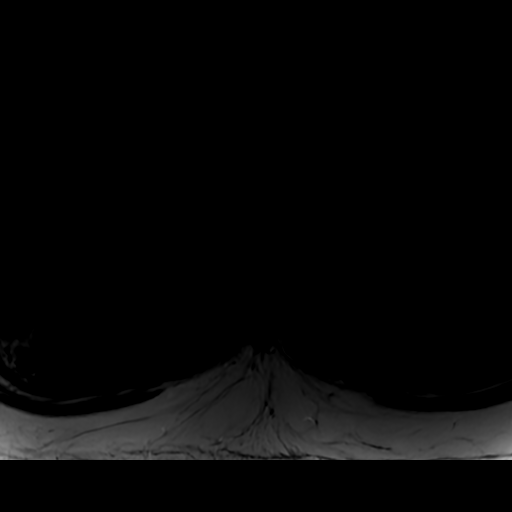

[Series 9: T2 post-contrast · sagittal · 3.0mm · 0.66mm/px · 2 of 17 slices shown]
[im 1/17]
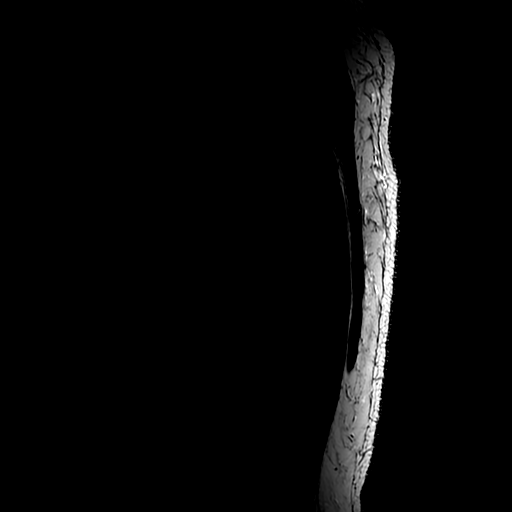
[im 17/17]
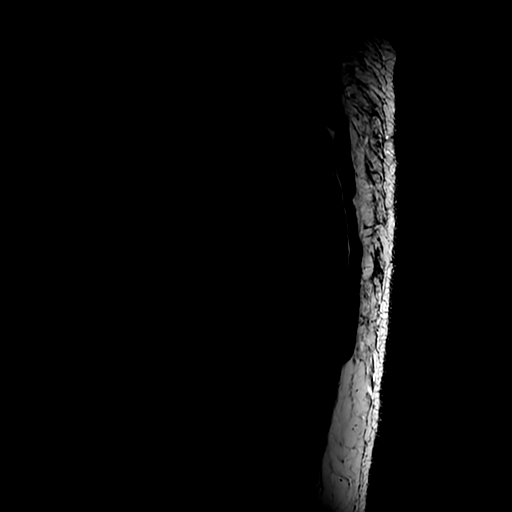

[18 of 48 positions shown; findings below may reference images not displayed]

FINDINGS: Evaluation is limited by motion artifact.

Alignment: No listhesis.

Vertebrae: Status post interval decompression with T5-T7
laminectomy. No other acute osseous finding. No abnormal osseous
enhancement.

Cord: T1 hypointense, contrast enhancing material is noted adjacent
to the fluid collection and along the posterior aspect of the thecal
sac from T6 through T8 (series 10, image 11 and series 11, images
26-33), and extending around the lateral and anterior aspects of the
thecal sac at T7-T8 (series 10, image 11 and series 11, images 32-33
and 39), concerning for epidural phlegmon.

In addition, posterior to the spinal cord in the operative bed
(series 9, image 10), there is a fluid collection that measures up
to 5 x 5 mm at the level of T7 (series 6, image 30). This is only
directly posterior to the spinal cord at the level of C7, where it
contributes to mild thecal sac narrowing but no significant cord
compression.

Redemonstrated pronounced epidural lipomatosis, which extends from
the level of T4 through the level of T10, which effaces the thecal
sac. There is otherwise improved thecal sac narrowing at T5 and T6.
Although evaluation is limited by motion artifact, the cord appears
normal in signal no abnormal enhancement.

Paraspinal and other soft tissues: Increased T2 signal and
enhancement in the operative bed and adjacent musculature, likely
edema. Aforementioned fluid collection (series 9, image 10), which
extends from the posterior aspect of the spinous process at T4
(series 6, image 21), and travels anteriorly through the soft
tissues to reach the posterior aspect of the thecal sac at T7
(series 6, image 30), then extending inferiorly and posteriorly
towards the facets at T7-T8 (series 6, image 35).

Disc levels:

With the exception of the T7 level, where there is mild thecal sac
narrowing secondary to postsurgical fluid collection and phlegmon,
there is no significant spinal canal stenosis.
IMPRESSION: 1. Evaluation is limited by motion artifact. Within this limitation,
patient is status post interval T5-T7 laminectomy, with findings
concerning for epidural phlegmon at the levels of T7 and T8, which
causes mild thecal sac narrowing at T7 in conjunction with a small
postoperative fluid collection.
2. Edema in the soft tissues posterior to T5-T7, with a small fluid
collection measuring up to 5 x 5 mm in the axial plane extending
from the posterior aspect of the T4 spinous process to the T7-T8
facets.
3. With the exception of the T7 level, described above, there is no
significant spinal canal stenosis.
4. Redemonstrated epidural lipomatosis extending from T4 through
T10, with the exception of the operative levels.

## 2021-09-06 MED ORDER — SODIUM CHLORIDE 0.9% FLUSH
10.0000 mL | Freq: Two times a day (BID) | INTRAVENOUS | Status: DC
  Administered 2021-09-06 – 2021-09-15 (×13): 10 mL

## 2021-09-06 MED ORDER — HEPARIN SODIUM (PORCINE) 5000 UNIT/ML IJ SOLN
5000.0000 [IU] | Freq: Three times a day (TID) | INTRAMUSCULAR | Status: DC
  Administered 2021-09-06 – 2021-09-15 (×27): 5000 [IU] via SUBCUTANEOUS
  Filled 2021-09-06 (×27): qty 1

## 2021-09-06 MED ORDER — POTASSIUM CHLORIDE 10 MEQ/50ML IV SOLN
10.0000 meq | INTRAVENOUS | Status: AC
  Administered 2021-09-06 – 2021-09-07 (×4): 10 meq via INTRAVENOUS
  Filled 2021-09-06 (×4): qty 50

## 2021-09-06 MED ORDER — POTASSIUM CHLORIDE CRYS ER 20 MEQ PO TBCR
20.0000 meq | EXTENDED_RELEASE_TABLET | ORAL | Status: AC
  Administered 2021-09-06 – 2021-09-07 (×2): 20 meq via ORAL
  Filled 2021-09-06 (×2): qty 1

## 2021-09-06 MED ORDER — GADOBUTROL 1 MMOL/ML IV SOLN
10.0000 mL | Freq: Once | INTRAVENOUS | Status: AC | PRN
  Administered 2021-09-06: 10 mL via INTRAVENOUS

## 2021-09-06 NOTE — Progress Notes (Signed)
[facility] ADULT ICU REPLACEMENT PROTOCOL ? ? ?The patient does apply for the [facility] Adult ICU Electrolyte Replacment Protocol based on the criteria listed below:  ? ?1.Exclusion criteria: TCTS patients, ECMO patients, and Dialysis patients ?2. Is GFR >/= 30 ml/min? Yes.    ?Patient's GFR today is > 60 ?3. Is SCr </= 2? Yes.   ?Patient's SCr is 0.77 mg/dL ?4. Did SCr increase >/= 0.5 in 24 hours? No. ?5.Pt's weight >40kg  Yes.   ?6. Abnormal electrolyte(s): K+ 3.0  ?7. Electrolytes replaced per protocol ?8.  Call MD STAT for K+ </= 2.5, Phos </= 1, or Mag </= 1 ?Physician:  Dr Marlane Mingle ? ?Jeri Modena 09/06/2021 9:35 PM  ?

## 2021-09-06 NOTE — Progress Notes (Signed)
Inpatient Rehab Admissions: ? ?Inpatient Rehab Consult received.  I met with patient at the bedside for rehabilitation assessment and to discuss goals and expectations of an inpatient rehab admission.  Pt acknowledged understanding of CIR goals and expectations. Pt interested in pursuing CIR. Pt gave permission to contact wife. Called wife and received no answer. Will continue to follow. ? ?Signed: ?Gayland Curry, MS, CCC-SLP ?Admissions Coordinator ?[phone] ? ? ?

## 2021-09-06 NOTE — Progress Notes (Signed)
Peripherally Inserted Central Catheter Placement ? ?The IV Nurse has discussed with the patient and/or persons authorized to consent for the patient, the purpose of this procedure and the potential benefits and risks involved with this procedure.  The benefits include less needle sticks, lab draws from the catheter, and the patient may be discharged home with the catheter. Risks include, but not limited to, infection, bleeding, blood clot (thrombus formation), and puncture of an artery; nerve damage and irregular heartbeat and possibility to perform a PICC exchange if needed/ordered by physician.  Alternatives to this procedure were also discussed.  Bard Power PICC patient education guide, fact sheet on infection prevention and patient information card has been provided to patient /or left at bedside.   ? ?PICC Placement Documentation  ?PICC Double Lumen 09/06/21 Right Basilic 45 cm 0 cm (Active)  ?Indication for Insertion or Continuance of Line Vasoactive infusions 09/06/21 1300  ?Exposed Catheter (cm) 0 cm 09/06/21 1300  ?Site Assessment Clean, Dry, Intact 09/06/21 1300  ?Lumen #1 Status Flushed;Saline locked;Blood return noted 09/06/21 1300  ?Lumen #2 Status Flushed;Saline locked;Blood return noted 09/06/21 1300  ?Dressing Type Securing device;Transparent 09/06/21 1300  ?Dressing Status Antimicrobial disc in place 09/06/21 1300  ?Safety Lock Not Applicable 09/06/21 1300  ?Line Care Connections checked and tightened 09/06/21 1300  ?Line Adjustment (NICU/IV Team Only) No 09/06/21 1300  ?Dressing Intervention New dressing 09/06/21 1300  ?Dressing Change Due 09/13/21 09/06/21 1300  ? ? ? ? ? ?Vernona Rieger  Carlyle Achenbach ?09/06/2021, 1:08 PM ? ?

## 2021-09-06 NOTE — Progress Notes (Addendum)
? ?NAME:  Cristian Grant, MRN:  [id number], DOB:  [birth year], LOS: 2 ?ADMISSION DATE:  09/04/2021, CONSULTATION DATE:  3/31 ?REFERRING MD:  Dawley, REASON FOR CONSULT:  MAP goals  ? ?History of Present Illness:  ?Cristian Grant, is a 36 y.o. male, who presented to the [facility] on 3/30 with a chief complaint of thoracic back pain and bilateral lower extremity weakness which began after lifting a heavy object 2 days prior to presentation. ? 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  He was admitted to the neurosurgery service and taken to the operating room on 3/31, where he underwent a bilateral T5, T6, T7 laminectomy for decompression and a right T6/7 medial facetectomy. ? ?He was admitted to the [facility]. neurosurgical ICU postop with MAP goals greater than 80 for 4 days postop.  PCCM was consulted for assistance with MAP goals and ICU needs ? ?Pertinent  Medical History  ?HLD ? ?Significant Hospital Events: ?Including procedures, antibiotic start and stop dates in addition to other pertinent events   ?3/31 admit to NSGY, bilateral T5, T6, T7 laminectomy for decompression and a right T6/7 medial facetectomy  ? ?Interim History / Subjective:  ?Levo at 10 to keep MAP > 80. Thinks he's developing some sensation up to his knees and feeling a little stronger.  ? ?Staph aureus in wound cultures ? ?Objective   ?Blood pressure (!) 99/58, pulse 73, temperature 98.3 ?F (36.8 ?C), temperature source Axillary, resp. rate 19, height 6\' 2"  (1.88 m), weight 136.1 kg, SpO2 98 %. ?   ?   ? ?Intake/Output Summary (Last 24 hours) at 09/06/2021 0923 ?Last data filed at 09/06/2021 0900 ?Gross per 24 hour  ?Intake 2684.47 ml  ?Output 2290 ml  ?Net 394.47 ml  ? ?Filed Weights  ? 09/04/21 0925  ?Weight: 136.1 kg  ? ? ?Examination: ?General appearance: 36 y.o., male, NAD, conversant  ?Eyes: PERRL, tracking appropriately ?Neck: Trachea midline; no  lymphadenopathy, no JVD ?Lungs: CTAB, normal SOB ?CV: RRR, no murmur  ?Abdomen: Soft, non-tender; non-distended, BS present  ?Extremities: No peripheral edema, warm ?Skin: Normal turgor and texture; no rash ?Psych: Appropriate affect ?Neuro: Attentive, bilateral LE weakness ? ? ?Labs/imaging reviewed ? ? ?Resolved Hospital Problem list   ? ? 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 On bowel regimen. ?-wean levo for MAP goal greater than 80 per NSGY until POD 7. Will obtain central access today. ?-Neurochecks per unit protocol ?-Trend fever/WBC curve ?-SCD ? ?HLD ?-Continue crestor ? ?Best Practice (right click and "Reselect all SmartList Selections" daily)  ? ?Diet/type: Regular consistency (see orders) ?DVT prophylaxis: SCD ?GI prophylaxis: PPI ?Lines: N/A ?Foley:  N/A ?Code Status:  full code ?Last date of multidisciplinary goals of care discussion [full scope] ? ?Critical care time: 35 minutes ?  ? ?This patient is critically ill with epidural abscess and lipomatosis and T5-7 stenosis s/p laminectomy requiring blood pressure augmentation with norepinephrine infusion; which, requires frequent high complexity decision making, assessment, support, evaluation, and titration of therapies. This was completed through the application of advanced monitoring technologies and extensive interpretation of multiple databases. During this encounter  critical care time was devoted to patient care services described in this note for 35 minutes.  ? ?Judithann Graves  ?[facility] ? ?09/06/2021 , 9:23 AM ? ?Please see [email or web address] for pager details ? ?If no  response, please call [phone] ?After hours, please call Elink at [phone] ? ? ? ? ?

## 2021-09-06 NOTE — PMR Pre-admission (Signed)
PMR Admission Coordinator Pre-Admission Assessment ? ?Patient: Cristian GanserChristopher Repire Grant is an 36 y.o., male ?MRN: [id number] ?DOB: 04/13/1986 ?Height: 6\' 2"  (188 cm) ?Weight: (!) 136.1 kg ? ?Insurance Information ?HMO:     PPO: yes     PCP:      IPA:      80/20:      OTHER:  ?PRIMARY: BCBS      Policy#: [id number]: [id number]      Subscriber: patient ?CM Name: Ollen Grossllen Wilson      Phone#: [phone]     Fax#: [phone] ?Pre-Cert#: [id number]  from 4/10 to 10/05/21 with update due on 10/05/21    Employer:  ?Benefits:  Phone #: [phone]     Name: 4/7 ?Eff. Date: 06/07/21     Deduct: $1250      Out of Pocket Max: $4890      Life Max: none ?CIR: 80%      SNF: 80% 100 days ?Outpatient: $10 to $52 per visit     Co-Pay: visits per medical neccesity ?Home Health: 80%      Co-Pay: visits per medical neccesity ?DME: 80%     Co-Pay: 20% ?Providers: in-network ? ?SECONDARY:       Policy#:      Phone#:  ? ?Financial Counselor:       Phone#:  ? ?The ?Data Collection Information Summary? for patients in Inpatient Rehabilitation Facilities with attached ?Privacy Act Statement-Health Care Records? was provided and verbally reviewed with: N/A ? ?Emergency Contact Information ?Contact Information   ? ? Name Relation Home Work Mobile  ? Viti,[name] Spouse [phone]  [phone]  ? ?  ? ?Current Medical History  ?Patient Admitting Diagnosis: epidural abscess ? ?History of Present Illness:  36 year old male with no significant medical hx. Pt presented on 09/03/21 due to pleuritic R upper back and lateral chest pain. Pain gradually worsened over several hours day prior; worse with deep breath and cough. Pt reported lifted something on Tuesday and developed mid/right back pain on Wednesday. CT negative for PE or PNA. Chest x-ray showed no obvious source of pain. Pt sent home. Pt returned to ED with chest tightness, some palpitations, bilateral flank pain, some difficulty breathing, and feeling lightheaded and fatigued. Pt reported symptoms  began ~1 hour after taking muscle relaxer prescribed at previous ED visit on an empty stomach. After diagnostic results reviewed, pt sent home. ? ? On 09/04/21, [facility] with c/o tingling in bilateral legs. Tingling goes to waist. MRI showed acute T6-7 compression. Neurosurgery consulted. 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 Pt had T5-7 laminectomy and evacuation of lipomatosis and resection of synovial cyst at T6-7. Concern for epidural abscess. ID consulted with plans for long term treatment with Cefazolin for MSSA epidural abscess.  Plan for TEE pending. Neurogenic bladder.  ? ? ?[facility] has been reviewed by the rehabilitation admission coordinator and physician. ? ?Past Medical History  ?Past Medical History:  ?Diagnosis Date  ? Hyperlipidemia   ? ? ?Has the patient had major surgery during 100 days prior to admission? Yes ? ?Family History   ?family history is not on file. ? ?Current Medications ? ?Current Facility-Administered Medications:  ?  0.9 %  sodium chloride infusion, 250 mL, Intravenous, Continuous, Larinda ButteryYap, Vanessa, MD, Last Rate: 0 mL/hr at 09/05/21 1630, New Bag at 09/14/21 1007 ?  acetaminophen (TYLENOL) tablet 650 mg, 650 mg, Oral, Q4H PRN,  650 mg at 09/09/21 0526 **OR** acetaminophen (TYLENOL) suppository 650 mg, 650 mg, Rectal, Q4H PRN, Dawley, Troy C, DO ?  albuterol (PROVENTIL) (2.5 MG/3ML) 0.083% nebulizer solution 2.5 mg, 2.5 mg, Nebulization, Q4H PRN, Craige Cotta, Vineet, MD ?  ceFAZolin (ANCEF) IVPB 2g/100 mL premix, 2 g, Intravenous, Q8H, Kathlynn Grate, DO, Last Rate: 200 mL/hr at 09/14/21 1121, 2 g at 09/14/21 1121 ?  Chlorhexidine Gluconate Cloth 2 % PADS 6 each, 6 each, Topical, Daily, Dawley, Troy C, DO, 6 each at 09/13/21 0826 ?  guaiFENesin (MUCINEX) 12 hr tablet 1,200 mg, 1,200 mg, Oral, BID, Sood, Vineet, MD, 1,200 mg at 09/14/21 1117 ?  heparin  injection 5,000 Units, 5,000 Units, Subcutaneous, Q8H, Dawley, Troy C, DO, 5,000 Units at 09/13/21 2200 ?  menthol-cetylpyridinium (CEPACOL) lozenge 3 mg, 1 lozenge, Oral, PRN **OR** phenol (CHLORASEPTIC) mouth spray 1 spray, 1 spray, Mouth/Throat, PRN, Dawley, Troy C, DO ?  methocarbamol (ROBAXIN) tablet 500 mg, 500 mg, Oral, Q6H PRN, 500 mg at 09/13/21 2205 **OR** [DISCONTINUED] methocarbamol (ROBAXIN) 500 mg in dextrose 5 % 50 mL IVPB, 500 mg, Intravenous, Q6H PRN, Dawley, Troy C, DO ?  morphine (PF) 2 MG/ML injection 2 mg, 2 mg, Intravenous, Q3H PRN, Dawley, Troy C, DO ?  ondansetron (ZOFRAN) tablet 4 mg, 4 mg, Oral, Q6H PRN **OR** ondansetron (ZOFRAN) injection 4 mg, 4 mg, Intravenous, Q6H PRN, Dawley, Troy C, DO ?  oxyCODONE (Oxy IR/ROXICODONE) immediate release tablet 10 mg, 10 mg, Oral, Q3H PRN, Dawley, Troy C, DO, 10 mg at 09/13/21 2205 ?  oxyCODONE (Oxy IR/ROXICODONE) immediate release tablet 5 mg, 5 mg, Oral, Q3H PRN, Dawley, Troy C, DO, 5 mg at 09/14/21 1127 ?  polyethylene glycol (MIRALAX / GLYCOLAX) packet 17 g, 17 g, Oral, Daily, Coralyn Helling, MD, 17 g at 09/14/21 1118 ?  polyvinyl alcohol (LIQUIFILM TEARS) 1.4 % ophthalmic solution 2 drop, 2 drop, Right Eye, PRN, Dawley, Troy C, DO ?  rosuvastatin (CRESTOR) tablet 10 mg, 10 mg, Oral, Daily, Dawley, Troy C, DO, 10 mg at 09/14/21 1118 ?  senna-docusate (Senokot-S) tablet 1 tablet, 1 tablet, Oral, QHS, Sood, Vineet, MD, 1 tablet at 09/13/21 2200 ?  sodium chloride flush (NS) 0.9 % injection 10-40 mL, 10-40 mL, Intracatheter, Q12H, Dawley, Troy C, DO, 10 mL at 09/13/21 2205 ?  tamsulosin (FLOMAX) capsule 0.4 mg, 0.4 mg, Oral, Daily, Tressie Stalker, MD, 0.4 mg at 09/14/21 1118 ? ?Patients Current Diet:  ?Diet Order   ? ?       ?  Diet regular Room service appropriate? Yes; Fluid consistency: Thin  Diet effective now       ?  ? ?  ?  ? ?  ? ?Precautions / Restrictions ?Precautions ?Precautions: Fall, Back ?Precaution Booklet Issued: No ?Precaution  Comments: reviewed 3/3 back precautions ?Restrictions ?Weight Bearing Restrictions: No ?Other Position/Activity Restrictions: no brace needed  ? ?Has the patient had 2 or more falls or a fall with injury in the past year? No ? ?Prior Activity Level ?Community (5-7x/wk): works, drives ? ?Prior Functional Level ?Self Care: Did the patient need help bathing, dressing, using the toilet or eating? Independent ? ?Indoor Mobility: Did the patient need assistance with walking from room to room (with or without device)? Independent ? ?Stairs: Did the patient need assistance with internal or external stairs (with or without device)? Independent ? ?Functional Cognition: Did the patient need help planning regular tasks such as shopping or remembering to take medications? Independent ? ?Patient Information ?Are you  of Hispanic, Latino/a,or Spanish origin?: A. No, not of Hispanic, Latino/a, or Spanish origin ?What is your race?: B. Black or African American ?Do you need or want an interpreter to communicate with a doctor or health care staff?: 0. No ? ?Patient's Response To:  ?Health Literacy and Transportation ?Is the patient able to respond to health literacy and transportation needs?: Yes ?Health Literacy - How often do you need to have someone help you when you read instructions, pamphlets, or other written material from your doctor or pharmacy?: Never ?In the past 12 months, has lack of transportation kept you from medical appointments or from getting medications?: No ?In the past 12 months, has lack of transportation kept you from meetings, work, or from getting things needed for daily living?: No ? ?Home Assistive Devices / Equipment ?Home Assistive Devices/Equipment: None ?Home Equipment: None ? ?Prior Device Use: Indicate devices/aids used by the patient prior to current illness, exacerbation or injury? None of the above ? ?Current Functional Level ?Cognition ? Overall Cognitive Status: Within Functional Limits for tasks  assessed ?Orientation Level: Oriented X4 ?General Comments: impaired recall of back precautions initially, is able to recall 3/3 at end of session ?   ?Extremity Assessment ?(includes Sensation/Coordination) ? Upp

## 2021-09-06 NOTE — Progress Notes (Signed)
? ?  Providing Compassionate, Quality Care - Together ? ?NEUROSURGERY PROGRESS NOTE ? ? ?S: No issues overnight.  Continue to show significant improvement in his neurologic status ? ?O: EXAM:  ?BP (!) 99/58   Pulse 73   Temp 98.3 ?F (36.8 ?C) (Axillary)   Resp 19   Ht 6\' 2"  (1.88 m)   Wt 136.1 kg   SpO2 98%   BMI 38.52 kg/m?  ? ?Awake, alert, oriented x3 ?PERRL ?Speech fluent, appropriate  ?CNs grossly intact  ?5/5 BUE ?Left lower extremity proximally 3/5, distally dorsiflexion/plantarflexion 4 -/5 ?Right lower extremity proximally 2/5, dorsiflexion 1/5, plantarflexion 3/5 ?Sensory to light touch improved, currently decreased sensation from calf and inferiorly ? ? ?ASSESSMENT:  ?36 y.o. male with  ? 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 ?-Status post T5-7 laminectomy ? ?PLAN: ?-Continue MAP parameters greater than 80 x 7 days ?-Heparin DVT prophylaxis ?-Pain control ?-ID consulted, cultures grew Staph aureus ?-We will obtain MRI T-spine with and without contrast for baseline imaging given culture results. ? ? ? ?Thank you for allowing me to participate in this patient's care.  Please do not hesitate to call with questions or concerns. ? ? ?Darcella Shiffman, DO ?Neurosurgeon ?[facility] ?Cell: [phone] ? ?

## 2021-09-06 NOTE — Consult Note (Signed)
?  [facility]  ? ? ?Date of Admission:  09/04/2021    ? ?Reason for Consult:Spine infection ?    ?Referring Physician: Dr Jake Samplesawley ? ?Current antibiotics: ?Vancomycin 3/31 -- present ?Ceftriaxone 3/31-- present ? ? ?ASSESSMENT:   ? ?36 y.o. male admitted with: ? 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  No hardware was placed at time of his surgery and cultures are growing Staph aureus. ?Obesity: Body mass index is 38.52 kg/m?.  ? ?RECOMMENDATIONS:   ? ?Continue vancomycin pending susceptibilities ?Stop ceftriaxone ?Agree with TTE ?Check blood cultures for completeness although will likely be suppressed since he has been on antibiotics for a couple days but need to ensure no concomitant bacteremia  ?MRI with and without contrast for baseline given finding of epidural abscess ?Wound care, glycemic control ?Check A1c, HIV ?Will follow ? ? ?Principal Problem: ?  Epidural abscess ?Active Problems: ?  Thoracic myelopathy ?  Morbid obesity (HCC) ?  Pure hypercholesterolemia ?  Staph aureus infection ? ? ?MEDICATIONS:   ? ?Scheduled Meds: ? Chlorhexidine Gluconate Cloth  6 each Topical Daily  ? docusate sodium  100 mg Oral BID  ? heparin injection (subcutaneous)  5,000 Units Subcutaneous Q8H  ? pantoprazole  40 mg Oral QHS  ? rosuvastatin  10 mg Oral Daily  ? sodium chloride flush  3 mL Intravenous Q12H  ? ?Continuous Infusions: ? sodium chloride Stopped (09/05/21 0558)  ? sodium chloride 50 mL/hr at 09/06/21 0700  ? sodium chloride Stopped (09/05/21 1630)  ? methocarbamol (ROBAXIN) IV    ? norepinephrine (LEVOPHED) Adult infusion 10 mcg/min (09/06/21 1034)  ? vancomycin Stopped (09/06/21 [id number])  ? ?PRN Meds:.acetaminophen **OR** acetaminophen, HYDROmorphone (DILAUDID) injection, menthol-cetylpyridinium  **OR** phenol, methocarbamol **OR** methocarbamol (ROBAXIN) IV, ondansetron **OR** ondansetron (ZOFRAN) IV, oxyCODONE, oxyCODONE, polyvinyl alcohol, sodium chloride flush ? ?HPI:   ? ?Cristian GanserChristopher Repire Grant is a 36 y.o. male with a past medical history significant for obesity and hyperlipidemia presented to the emergency department on 3/30 with a chief complaint of thoracic back pain coinciding with lower extremity weakness.  He reports this began approximately 2 days prior to admission after injuring his back while lifting a heavy object.  MRI without contrast was obtained showing T5-7 stenosis with T6-7 severe stenosis due to a synovial cyst and an epidural lipomatosis.  He was admitted to neurosurgery and taken to the OR on 3/31 where he underwent bilateral T5, T6, T7 laminectomy for decompression and right T6-7 medial facetectomy.  Intraoperatively there was no hardware placed.  Interestingly, he was found to have a small amount of purulent fluid encountered which was sent for cultures.  He was also placed on broad-spectrum antibiotics.  Cultures from his epidural space are growing Staph aureus with susceptibilities pending.  We have been consulted for further antibiotic recommendations.  Patient reports that he has no history of injection drug use or high risk behaviors.  Prior to his injury 2 days prior to admission, he reports in his usual state of health.  He remains in the ICU on 4 N. with improved sensory to light touch and strength bilaterally. ? ? ?History reviewed. No pertinent past medical history. ? ?Social History  ? ?Tobacco Use  ? Smoking status: Never  ? Smokeless tobacco: Never  ?Substance Use Topics  ? Alcohol use: Yes  ?  Drug use: Not Currently  ? ? ?History reviewed. No pertinent family history. ? ?No Known Allergies ? ?Review of Systems  ?Constitutional:  Negative for fever.  ?Respiratory: Negative.    ?Cardiovascular: Negative.   ?Gastrointestinal: Negative.   ?Musculoskeletal:  Positive  for back pain.  ?Neurological:  Positive for sensory change and weakness.  ?All other systems reviewed and are negative. ? ?OBJECTIVE:  ? ?Blood pressure (!) 99/58, pulse 73, temperature 98.3 ?F (36.8 ?C), temperature source Axillary, resp. rate 19, height 6\' 2"  (1.88 m), weight 136.1 kg, SpO2 98 %. ?Body mass index is 38.52 kg/m?. ? ?Physical Exam ?Constitutional:   ?   General: He is not in acute distress. ?   Appearance: Normal appearance. He is obese.  ?HENT:  ?   Head: Normocephalic and atraumatic.  ?Eyes:  ?   Extraocular Movements: Extraocular movements intact.  ?   Conjunctiva/sclera: Conjunctivae normal.  ?Cardiovascular:  ?   Rate and Rhythm: Normal rate and regular rhythm.  ?Pulmonary:  ?   Effort: Pulmonary effort is normal. No respiratory distress.  ?   Breath sounds: Normal breath sounds.  ?Abdominal:  ?   General: There is no distension.  ?   Palpations: Abdomen is soft.  ?   Tenderness: There is no abdominal tenderness.  ?Musculoskeletal:  ?   Cervical back: Normal range of motion and neck supple.  ?   Right lower leg: No edema.  ?   Left lower leg: No edema.  ?   Comments: S/p PICC placement.   ?Skin: ?   General: Skin is warm and dry.  ?Neurological:  ?   General: No focal deficit present.  ?   Mental Status: He is alert and oriented to person, place, and time.  ?   Motor: Weakness present.  ?Psychiatric:     ?   Mood and Affect: Mood normal.     ?   Behavior: Behavior normal.  ? ? ? ?Lab Results: ?Lab Results  ?Component Value Date  ? WBC 11.3 (H) 09/05/2021  ? HGB 11.5 (L) 09/05/2021  ? HCT 34.6 (L) 09/05/2021  ? MCV 94.5 09/05/2021  ? PLT 383 09/05/2021  ?  ?Lab Results  ?Component Value Date  ? NA 137 09/06/2021  ? K 3.4 (L) 09/06/2021  ? CO2 24 09/06/2021  ? GLUCOSE 148 (H) 09/06/2021  ? BUN 8 09/06/2021  ? CREATININE 0.82 09/06/2021  ? CALCIUM 8.2 (L) 09/06/2021  ? GFRNONAA >60 09/06/2021  ? No results found for: ALT, AST, GGT, ALKPHOS, BILITOT ? ?   ?Component Value Date/Time  ? CRP 13.6 (H)  09/04/2021 2004  ? ? ?   ?Component Value Date/Time  ? ESRSEDRATE 44 (H) 09/04/2021 2004  ? ? ?I have reviewed the micro and lab results in Epic. ? ?Imaging: ?DG Thoracic Spine 1 View ? ?Result Date: 09/04/2021 ?CLINICAL DATA:  T5-T7 laminectomy. Intraoperative fluoroscopic radiograph EXAM: OPERATIVE THORACIC SPINE single VIEW(S) COMPARISON:  None. FINDINGS: Single frontal fluoroscopic radiograph of the a thoracic spine demonstrates a thoracic spine from T1 through the superior endplate of T5. Surgical clip overlies the superior aspect of the T5 vertebral body the inferior margin of the examination. Endotracheal tube is in place. Rudimentary cervical ribs are noted at C7. Fluoroscopy time: 12 seconds Fluoroscopic dose: 4.07 mGy Images: 1 IMPRESSION: Intraoperative fluoroscopic radiographs as described above Electronically Signed   By: 09/06/2021 M.D.   On: 09/04/2021 19:45  ? ?DG C-Arm 1-60 Min-No Report ? ?Result Date:  09/04/2021 ?Fluoroscopy was utilized by the requesting physician.  No radiographic interpretation.  ? ?[place] EKG SITE RITE ? ?Result Date: 09/06/2021 ?If [employer] not attached, placement could not be confirmed due to current cardiac rhythm.   ? ?Imaging independently reviewed in Epic. ? ?Kathlynn Grate ?[facility] ?[facility] ?[phone] pager ?09/06/2021, 11:45 AM ? ?I have personally spent 80 minutes involved in face-to-face and non-face-to-face activities for this patient on the day of the visit. Professional time spent includes the following activities: Preparing to see the patient (review of tests), Obtaining and/or reviewing separately obtained history (admission/discharge record), Performing a medically appropriate examination and/or evaluation , Ordering medications/tests/procedures, referring and communicating with other health care professionals, Documenting clinical information in the EMR, Independently interpreting results (not separately  reported), Communicating results to the patient/family/caregiver, Counseling and educating the patient/family/caregiver and Care coordination (not separately reported).  ? ? ?

## 2021-09-07 ENCOUNTER — Encounter: Payer: Self-pay | Admitting: Pulmonary Disease

## 2021-09-07 ENCOUNTER — Inpatient Hospital Stay (HOSPITAL_COMMUNITY): Payer: BC Managed Care – PPO

## 2021-09-07 DIAGNOSIS — A4901 Methicillin susceptible Staphylococcus aureus infection, unspecified site: Secondary | ICD-10-CM

## 2021-09-07 DIAGNOSIS — I38 Endocarditis, valve unspecified: Secondary | ICD-10-CM

## 2021-09-07 DIAGNOSIS — G9520 Unspecified cord compression: Secondary | ICD-10-CM | POA: Diagnosis not present

## 2021-09-07 DIAGNOSIS — G062 Extradural and subdural abscess, unspecified: Secondary | ICD-10-CM | POA: Diagnosis not present

## 2021-09-07 LAB — CBC
HCT: 36.4 % — ABNORMAL LOW (ref 39.0–52.0)
Hemoglobin: 12.1 g/dL — ABNORMAL LOW (ref 13.0–17.0)
MCH: 31.2 pg (ref 26.0–34.0)
MCHC: 33.2 g/dL (ref 30.0–36.0)
MCV: 93.8 fL (ref 80.0–100.0)
Platelets: 399 10*3/uL (ref 150–400)
RBC: 3.88 MIL/uL — ABNORMAL LOW (ref 4.22–5.81)
RDW: 12.9 % (ref 11.5–15.5)
WBC: 10.3 10*3/uL (ref 4.0–10.5)
nRBC: 0 % (ref 0.0–0.2)

## 2021-09-07 LAB — HIV ANTIBODY (ROUTINE TESTING W REFLEX): HIV Screen 4th Generation wRfx: NONREACTIVE

## 2021-09-07 LAB — ECHOCARDIOGRAM COMPLETE
AR max vel: 8.24 cm2
AV Area VTI: 7.98 cm2
AV Area mean vel: 8.01 cm2
AV Mean grad: 3 mmHg
AV Peak grad: 5.3 mmHg
Ao pk vel: 1.15 m/s
Area-P 1/2: 3.85 cm2
Height: 74 in
MV VTI: 9.56 cm2
S' Lateral: 3.8 cm
Weight: 4800 oz

## 2021-09-07 LAB — BASIC METABOLIC PANEL
Anion gap: 6 (ref 5–15)
BUN: 5 mg/dL — ABNORMAL LOW (ref 6–20)
CO2: 27 mmol/L (ref 22–32)
Calcium: 8.4 mg/dL — ABNORMAL LOW (ref 8.9–10.3)
Chloride: 104 mmol/L (ref 98–111)
Creatinine, Ser: 0.8 mg/dL (ref 0.61–1.24)
GFR, Estimated: >60 mL/min (ref >60)
Glucose, Bld: 120 mg/dL — ABNORMAL HIGH (ref 70–99)
Potassium: 3.6 mmol/L (ref 3.5–5.1)
Sodium: 137 mmol/L (ref 135–145)

## 2021-09-07 LAB — HEMOGLOBIN A1C
Hgb A1c MFr Bld: 5.2 % (ref 4.8–5.6)
Mean Plasma Glucose: 103 mg/dL

## 2021-09-07 MED ORDER — CEFAZOLIN SODIUM-DEXTROSE 2-4 GM/100ML-% IV SOLN
2.0000 g | Freq: Three times a day (TID) | INTRAVENOUS | Status: DC
Start: 2021-09-07 — End: 2021-09-15
  Administered 2021-09-07 – 2021-09-15 (×23): 2 g via INTRAVENOUS
  Filled 2021-09-07 (×26): qty 100

## 2021-09-07 MED ORDER — POTASSIUM CHLORIDE CRYS ER 20 MEQ PO TBCR
40.0000 meq | EXTENDED_RELEASE_TABLET | Freq: Once | ORAL | Status: AC
Start: 2021-09-07 — End: 2021-09-07
  Administered 2021-09-07: 40 meq via ORAL
  Filled 2021-09-07: qty 2

## 2021-09-07 MED ORDER — PERFLUTREN LIPID MICROSPHERE
1.0000 mL | INTRAVENOUS | Status: DC | PRN
  Administered 2021-09-07: 2 mL via INTRAVENOUS
  Filled 2021-09-07: qty 10

## 2021-09-07 NOTE — Progress Notes (Addendum)
? ?NAME:  Cristian Grant, MRN:  [id number], DOB:  [birth year], LOS: 3 ?ADMISSION DATE:  09/04/2021, CONSULTATION DATE:  3/31 ?REFERRING MD:  Dawley, REASON FOR CONSULT:  MAP goals  ? ?History of Present Illness:  ?Cristian Grant, is a 36 y.o. male, who presented to the [facility] on 3/30 with a chief complaint of thoracic back pain and bilateral lower extremity weakness which began after lifting a heavy object 2 days prior to presentation. ? 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  He was admitted to the neurosurgery service and taken to the operating room on 3/31, where he underwent a bilateral T5, T6, T7 laminectomy for decompression and a right T6/7 medial facetectomy. ? ?He was admitted to the 4 N. neurosurgical ICU postop with MAP goals greater than 80 for 4 days postop.  PCCM was consulted for assistance with MAP goals and ICU needs ? ?Pertinent  Medical History  ?HLD ? ?Significant Hospital Events: ?Including procedures, antibiotic start and stop dates in addition to other pertinent events   ?3/31 admit to NSGY, bilateral T5, T6, T7 laminectomy for decompression and a right T6/7 medial facetectomy  ?4/2 staph aureus isolated in surgical wound cultures.  ?MR t spine 4/2 with post-op fluid collection at t5-t7 with mild thecal sac narrowing. No other spinal stenosis.  ? ?Interim History / Subjective:  ?Feeling better today. Some back pain r/t surgical site and being in bed. Well controlled with ordered medications. Sensation and movement of legs improving ? ? ?Objective   ?Blood pressure 116/65, pulse 82, temperature 99.6 ?F (37.6 ?C), temperature source Axillary, resp. rate 15, height 6\' 2"  (1.88 m), weight 136.1 kg, SpO2 96 %. ?   ?   ? ?Intake/Output Summary (Last 24 hours) at 09/07/2021 0923 ?Last data filed at 09/07/2021 0701 ?Gross per 24 hour  ?Intake 3214.57 ml  ?Output 4580 ml  ?Net -1365.43 ml  ? ? ?Filed  Weights  ? 09/04/21 0925  ?Weight: 136.1 kg  ? ? ?Examination: ?General appearance: adult male in NAD ?Eyes: PERRL, antiicteric  ?Neck: Tracheal midline, no JVD ?Lungs: Clear bilateral  breath sounds, no distress.  ?CV: RRR, no MRG ?Abdomen: Soft, non-tender, non-distended. Normoactive.  ?Extremities: No acute deformity. No edema.  ?Skin: Grossly intact.  ?Neuro: Alert, oriented, non-focal.  ? ? ?Resolved Hospital Problem list   ? ? ?Assessment & Plan:  ?T5-7 stenosis, T6-7 severe stenosis s/p T5-7 laminectomy, resection of epidural lipomatosis 3/31 ?Staph Aureus Epidural abcess, unclear etiology ?Paraplegia, secondary to above ? ?Movement and sensation in lower extremities seem to be improving daily.  ? ?-Post op surgical management and site management per neurosurgery ?-Vancomycin. ID following ?-Cultures pending  ?-Echo done, read pending.  ?-Multimodal pain control with as needed Tylenol, Dilaudid, Robaxin, oxycodone. Bowel regimen.  ?-wean levo for MAP goal greater than 80 per NSGY until POD 7.  ?-Neurochecks per unit protocol ?-Trend fever/WBC curve ? ?Hypokalemia ?- give K ? ?HLD ?-Continue crestor ? ?Best Practice (right click and "Reselect all SmartList Selections" daily)  ? ?Diet/type: Regular consistency (see orders) ?DVT prophylaxis: prophylactic heparin  ?GI prophylaxis: PPI ?Lines: Central line PICC placed 4/2 ?Foley:  N/A ?Code Status:  full code ?Last date of multidisciplinary goals of care discussion [full scope] ? ?Critical care time: 32 minutes : titratable norepinephrine infusion for blood pressure augmentation.  ?  ? ? ?Georgann Housekeeper, AGACNP-BC ?[facility] ? ?See  Amion for personal pager ?PCCM on call pager [phone] until 7pm. ?Please call Elink 7p-7a. [phone] ? ?09/07/2021 9:38 AM ? ? ? ? ? ? ?

## 2021-09-07 NOTE — Progress Notes (Signed)
Physical Therapy Treatment ?Patient Details ?Name: Cristian Grant ?MRN: [id number] ?DOB: 07-Jan-1986 ?Today's Date: 09/07/2021 ? ? ?History of Present Illness Pt is a 36 y.o. male who presented 09/03/21 with chest and upper back pain after lifting a heavy amplifier 2 days prior. Pt also with lower extremity weakness and numbness. 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 S/p T5-7 laminectomy, resection of epidural lipomatosis, and resection of synovial cyst for decompression 3/31. PMH: HLD ? ?  ?PT Comments  ? ? Pt received in bed, states he feels that he is able to move RLE better today. Pt able to come to bedside with mod A to RLE and mod HHA for SL to sit. Pt needed increased time to scoot hips to EOB but was able to do so without LOB in sitting. Antony Salmon was used for sit>stand. Pt began having R scapular pain in sitting and did not feel that he could tolerate for standing. RN gave 1 mg dilaudid which helped pain to the point that he was able to tolerate it for mobility. Stood from bed with mod A +2 and flaps of stedy with min A +2. However, he became nauseous and dizzy with drop in BP to 100/81 so did not transfer to chair but returned to supine. Plan to coordinate timing of oral pain meds next session for pain mgmt since oxy had this effect on him. PT will continue to follow. ?  ?Recommendations for follow up therapy are one component of a multi-disciplinary discharge planning process, led by the attending physician.  Recommendations may be updated based on patient status, additional functional criteria and insurance authorization. ? ?Follow Up Recommendations ? Acute inpatient rehab (3hours/day) ?  ?  ?Assistance Recommended at Discharge Intermittent Supervision/Assistance  ?Patient can return home with the following A lot of help with walking and/or transfers;Two people to help with walking and/or transfers;A lot of help with  bathing/dressing/bathroom;Assistance with cooking/housework;Assist for transportation;Help with stairs or ramp for entrance ?  ?Equipment Recommendations ? Rolling walker (2 wheels);BSC/3in1;Wheelchair (measurements PT);Wheelchair cushion (measurements PT);Hospital bed;Other (comment) (bariatric size; hoyer lift; pending progress)  ?  ?Recommendations for Other Services Rehab consult ? ? ?  ?Precautions / Restrictions Precautions ?Precautions: Fall;Back ?Precaution Booklet Issued: No ?Precaution Comments: reviewed spinal precautions ?Required Braces or Orthoses:  (no brace needed) ?Restrictions ?Weight Bearing Restrictions: No  ?  ? ?Mobility ? Bed Mobility ?Overal bed mobility: Needs Assistance ?Bed Mobility: Rolling, Sidelying to Sit, Sit to Supine ?Rolling: Mod assist ?Sidelying to sit: Min assist, HOB elevated ?  ?Sit to supine: Mod assist, +2 for physical assistance ?  ?General bed mobility comments: Cues to log roll, needing mod A to assist RLE off bed as he rolled to L using bed rail.Mod HHA for SL to sit. Pt drowsy with return to supine from sitting and needed mod A +2 mainly for lower body ?  ? ?Transfers ?Overall transfer level: Needs assistance ?Equipment used: Ambulation equipment used ?Transfers: Sit to/from Stand ?Sit to Stand: +2 physical assistance, +2 safety/equipment, From elevated surface, Mod assist ?  ?  ?  ?  ?  ?General transfer comment: mod A +2 for power up with cues to lift chest up instead of fwd. Pt dizzy and nauseous when standing and sitting in stedy with BP down to 100/81. Stood from flaps and then returned to supine ?  ? ?Ambulation/Gait ?  ?  ?  ?  ?  ?  ?  ?General  Gait Details: unable ? ? ?Stairs ?  ?  ?  ?  ?  ? ? ?Wheelchair Mobility ?  ? ?Modified Rankin (Stroke Patients Only) ?  ? ? ?  ?Balance Overall balance assessment: Needs assistance ?Sitting-balance support: Single extremity supported, Bilateral upper extremity supported, Feet supported ?Sitting balance-Leahy Scale:  Fair ?Sitting balance - Comments: pt able to maintain balance sitting EOB ?  ?Standing balance support: Bilateral upper extremity supported, Reliant on assistive device for balance ?Standing balance-Leahy Scale: Poor ?Standing balance comment: min A +2 standing in stedy ?  ?  ?  ?  ?  ?  ?  ?  ?  ?  ?  ?  ? ?  ?Cognition Arousal/Alertness: Awake/alert, Lethargic, Suspect due to medications ?Behavior During Therapy: [facility] for tasks assessed/performed ?Overall Cognitive Status: Within Functional Limits for tasks assessed ?  ?  ?  ?  ?  ?  ?  ?  ?  ?  ?  ?  ?  ?  ?  ?  ?General Comments: alert but then lethargic after dilaudid ?  ?  ? ?  ?Exercises General Exercises - Lower Extremity ?Ankle Circles/Pumps: AROM, Both, 20 reps, Supine ?Quad Sets: AROM, Both, 10 reps, Supine ?Gluteal Sets: AROM, 10 reps, Supine ?Long Arc Quad: AROM, Right, 5 reps, Seated (sometimes needing AAROM) ?Straight Leg Raises: AAROM, Both, 5 reps, Supine ? ?  ?General Comments General comments (skin integrity, edema, etc.): BP returned to 114/85 in supine. SPO2 94% on RA. RN present. Dizziness subsided once pt back in supine ?  ?  ? ?Pertinent Vitals/Pain Pain Assessment ?Pain Assessment: Faces ?Faces Pain Scale: Hurts whole lot ?Pain Location: upper R back ?Pain Descriptors / Indicators: Discomfort, Grimacing, Operative site guarding, Tingling ?Pain Intervention(s): Premedicated before session, RN gave pain meds during session, Monitored during session, Limited activity within patient's tolerance  ? ? ?Home Living   ?  ?  ?  ?  ?  ?  ?  ?  ?  ?   ?  ?Prior Function    ?  ?  ?   ? ?PT Goals (current goals can now be found in the care plan section) Acute Rehab PT Goals ?Patient Stated Goal: to get better ?PT Goal Formulation: With patient ?Time For Goal Achievement: 09/19/21 ?Potential to Achieve Goals: Good ?Progress towards PT goals: Not progressing toward goals - comment (hypotension) ? ?  ?Frequency ? ? ? Min 5X/week ? ? ? ?  ?PT Plan Current  plan remains appropriate  ? ? ?Co-evaluation   ?  ?  ?  ?  ? ?  ?AM-PAC PT "6 Clicks" Mobility   ?Outcome Measure ? Help needed turning from your back to your side while in a flat bed without using bedrails?: A Lot ?Help needed moving from lying on your back to sitting on the side of a flat bed without using bedrails?: Total ?Help needed moving to and from a bed to a chair (including a wheelchair)?: Total ?Help needed standing up from a chair using your arms (e.g., wheelchair or bedside chair)?: Total ?Help needed to walk in hospital room?: Total ?Help needed climbing 3-5 steps with a railing? : Total ?6 Click Score: 7 ? ?  ?End of Session Equipment Utilized During Treatment: Gait belt ?Activity Tolerance: Treatment limited secondary to medical complications (Comment) (orthostatic hypotension) ?Patient left: with call bell/phone within reach;with family/visitor present;in bed ?Nurse Communication: Mobility status;Need for lift equipment ?PT Visit Diagnosis: Unsteadiness on feet (R26.81);Muscle  weakness (generalized) (M62.81);Difficulty in walking, not elsewhere classified (R26.2);Other symptoms and signs involving the nervous system (R29.898);Pain ?Pain - part of body:  (back) ?  ? ? ?Time: 1700-1749 ?PT Time Calculation (min) (ACUTE ONLY): 37 min ? ?Charges:  $Therapeutic Exercise: 8-22 mins ?$Therapeutic Activity: 8-22 mins          ?          ? ?Lyanne Co, PT  ?Acute Rehab Services ? Pager [phone] ?Office [phone] ? ? ? ?Jermiah Soderman L Rickayla Wieland ?09/07/2021, 1:21 PM ? ?

## 2021-09-07 NOTE — Progress Notes (Signed)
?  Transition of Care (TOC) Screening Note ? ? ?Patient Details  ?Name: Cristian Grant ?Date of Birth: 09/10/1985 ? ? ?Transition of Care (TOC) CM/SW Contact:    ?Mearl Latin, LCSW ?Phone Number: ?09/07/2021, 9:46 AM ? ? ? ?Transition of Care Department [facility]) has reviewed patient and no TOC needs have been identified at this time. We will continue to monitor patient advancement through interdisciplinary progression rounds. If new patient transition needs arise, please place a TOC consult. ? ? ?

## 2021-09-07 NOTE — Progress Notes (Signed)
Subjective: ?Patient reports that he is continuing to note improvement in the strength and sensation in his BLE. No acute events overnight.  ? ?Objective: ?Vital signs in last 24 hours: ?Temp:  [97.7 ?F (36.5 ?C)-99.6 ?F (37.6 ?C)] 99.6 ?F (37.6 ?C) (04/03 0800) ?Pulse Rate:  [60-92] 82 (04/03 0600) ?Resp:  [9-27] 15 (04/03 0600) ?BP: (99-143)/(58-97) 116/65 (04/03 0600) ?SpO2:  [91 %-98 %] 96 % (04/03 0600) ? ?Intake/Output from previous day: ?04/02 0701 - 04/03 0700 ?In: 3173.8 [P.O.:1330; I.V.:1223.1; IV Piggyback:620.7] ?Out: 5080 [Urine:5050; Drains:30] ?Intake/Output this shift: ?Total I/O ?In: 280.8 [I.V.:43.7; IV Piggyback:237.1] ?Out: -  ?Physical Exam: Patient is awake, A/O X 4, and conversant. He is in NAD and VSS. Speech is fluent and appropriate. PERLA, EOMI. CNs grossly intact. ?LLE proximally 3/5, distally dorsiflexion/plantarflexion 4-/5 ?RLE proximally 2/5, dorsiflexion 1/5, plantarflexion 3/5 ?Sensory to light touch improved, currently decreased sensation from ankle and inferiorly ?  ? ?  ? ?Lab Results: ?Recent Labs  ?  09/05/21 ?0413 09/07/21 ?0541  ?WBC 11.3* 10.3  ?HGB 11.5* 12.1*  ?HCT 34.6* 36.4*  ?PLT 383 399  ? ?BMET ?Recent Labs  ?  09/06/21 ?2019 09/07/21 ?0541  ?NA 136 137  ?K 3.0* 3.6  ?CL 102 104  ?CO2 26 27  ?GLUCOSE 142* 120*  ?BUN <5* 5*  ?CREATININE 0.77 0.80  ?CALCIUM 8.2* 8.4*  ? ? ?Studies/Results: ?MR THORACIC SPINE W WO CONTRAST ? ?Result Date: 09/06/2021 ?CLINICAL DATA:  Osteomyelitis, thoracic epidural abscess EXAM: MRI THORACIC WITHOUT AND WITH CONTRAST TECHNIQUE: Multiplanar and multiecho pulse sequences of the thoracic spine were obtained without and with intravenous contrast. CONTRAST:  79mL GADAVIST GADOBUTROL 1 MMOL/ML IV SOLN COMPARISON:  MRI thoracic spine 09/04/2021 FINDINGS: Evaluation is limited by motion artifact. Alignment: No listhesis. Vertebrae: Status post interval decompression with T5-T7 laminectomy. No other acute osseous finding. No abnormal osseous  enhancement. Cord: T1 hypointense, contrast enhancing material is noted adjacent to the fluid collection and along the posterior aspect of the thecal sac from T6 through T8 (series 10, image 11 and series 11, images 26-33), and extending around the lateral and anterior aspects of the thecal sac at T7-T8 (series 10, image 11 and series 11, images 32-33 and 39), concerning for epidural phlegmon. In addition, posterior to the spinal cord in the operative bed (series 9, image 10), there is a fluid collection that measures up to 5 x 5 mm at the level of T7 (series 6, image 30). This is only directly posterior to the spinal cord at the level of C7, where it contributes to mild thecal sac narrowing but no significant cord compression. Redemonstrated pronounced epidural lipomatosis, which extends from the level of T4 through the level of T10, which effaces the thecal sac. There is otherwise improved thecal sac narrowing at T5 and T6. Although evaluation is limited by motion artifact, the cord appears normal in signal no abnormal enhancement. Paraspinal and other soft tissues: Increased T2 signal and enhancement in the operative bed and adjacent musculature, likely edema. Aforementioned fluid collection (series 9, image 10), which extends from the posterior aspect of the spinous process at T4 (series 6, image 21), and travels anteriorly through the soft tissues to reach the posterior aspect of the thecal sac at T7 (series 6, image 30), then extending inferiorly and posteriorly towards the facets at T7-T8 (series 6, image 35). Disc levels: With the exception of the T7 level, where there is mild thecal sac narrowing secondary to postsurgical fluid collection and phlegmon, there  is no significant spinal canal stenosis. IMPRESSION: 1. Evaluation is limited by motion artifact. Within this limitation, patient is status post interval T5-T7 laminectomy, with findings concerning for epidural phlegmon at the levels of T7 and T8, which  causes mild thecal sac narrowing at T7 in conjunction with a small postoperative fluid collection. 2. Edema in the soft tissues posterior to T5-T7, with a small fluid collection measuring up to 5 x 5 mm in the axial plane extending from the posterior aspect of the T4 spinous process to the T7-T8 facets. 3. With the exception of the T7 level, described above, there is no significant spinal canal stenosis. 4. Redemonstrated epidural lipomatosis extending from T4 through T10, with the exception of the operative levels. Electronically Signed   By: Wiliam Ke M.D.   On: 09/06/2021 17:43  ? ?[place] EKG SITE RITE ? ?Result Date: 09/06/2021 ?If [employer] not attached, placement could not be confirmed due to current cardiac rhythm.  ? ?Assessment/Plan: ?36 y.o. 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 He is continuing to recover well from his surgery. Sensation is now intact from the ankle region and inferiorly. Lower extremity strength is continuing to slowly improve.  ?  ? ?-Continue MAP parameters greater than 80 x 7 days ?-Heparin DVT prophylaxis ?-Pain control ?-ABX per ID ? ?  ? LOS: 3 days  ? ? ? ?Council Mechanic, DNP, NP-C ?09/07/2021, 8:49 AM ? ? ? ? ?

## 2021-09-07 NOTE — Plan of Care (Signed)
Continues to improve and progress  ?

## 2021-09-07 NOTE — Plan of Care (Signed)

## 2021-09-07 NOTE — Progress Notes (Signed)
?   ? ?[facility] for Infectious Disease ? ?Date of Admission:  09/04/2021    ?       ?Reason for visit: Follow up on an abscess ? ?Current antibiotics: ?Vancomycin 3/31 -- present ? ?Previous antibiotics: ?Ceftriaxone 3/31-- 4/2 ? ?ASSESSMENT:   ? ?36 y.o. male admitted with: ? 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 [name]).  No hardware placed at time of surgery.  Cultures are growing MSSA.  Blood cultures were obtained 4/2 and are currently no growth to date.  TTE was performed today which did not indicate any vegetations, however, was noted to be a difficult study with poor windows.  MRI with contrast was repeated yesterday noting possible epidural phlegmon at T7 and T8 causing mild thecal sac narrowing at T7 in conjunction with a small postoperative fluid collection as well as redemonstrated epidural lipomatosis from T4-T10 with the exception of the operative levels. ?Obesity: Body mass index is 38.52 kg/m?. ? ?RECOMMENDATIONS:   ? ?Will change from vancomycin to cefazolin based on culture susceptibilities ?TTE was a poor quality study but did not indicate any vegetations.  His blood cultures are likely to be unreliable as they were obtained after approximately 3 days of antibiotics.  Given the findings of staph aureus epidural abscess of unknown etiology, would recommend pursuing a TEE to definitively exclude endocarditis ?Follow-up A1c and HIV ?Will follow ? ? ?Principal Problem: ?  Epidural abscess ?Active Problems: ?  Thoracic myelopathy ?  Morbid obesity ([place]) ?  Pure hypercholesterolemia ?  Staph aureus infection ? ? ? ?MEDICATIONS:   ? ?Scheduled Meds: ? Chlorhexidine Gluconate Cloth  6 each Topical Daily  ? docusate sodium  100 mg Oral BID  ? heparin injection (subcutaneous)  5,000 Units  Subcutaneous Q8H  ? pantoprazole  40 mg Oral QHS  ? rosuvastatin  10 mg Oral Daily  ? sodium chloride flush  10-40 mL Intracatheter Q12H  ? sodium chloride flush  3 mL Intravenous Q12H  ? ?Continuous Infusions: ? sodium chloride Stopped (09/05/21 0558)  ? sodium chloride Stopped (09/06/21 2134)  ? sodium chloride Stopped (09/05/21 1630)  ? methocarbamol (ROBAXIN) IV    ? norepinephrine (LEVOPHED) Adult infusion 7 mcg/min (09/07/21 1000)  ? vancomycin Stopped (09/07/21 [id number])  ? ?PRN Meds:.acetaminophen **OR** acetaminophen, HYDROmorphone (DILAUDID) injection, menthol-cetylpyridinium **OR** phenol, methocarbamol **OR** methocarbamol (ROBAXIN) IV, ondansetron **OR** ondansetron (ZOFRAN) IV, oxyCODONE, oxyCODONE, polyvinyl alcohol, sodium chloride flush ? ?SUBJECTIVE:  ? ?24 hour events:  ?No acute events have been noted overnight ?Afebrile, Tmax 99.6 ?Surgical cultures with MSSA ?Blood cultures negative ?PICC line was placed ?MRI results as noted above ?Lab monitoring stable ? ?Patient reports improved movement and sensation in his legs this morning.  No fevers or chills.  Tolerating antibiotics. ? ?Review of Systems  ?All other systems reviewed and are negative. ? ?  ?OBJECTIVE:  ? ?Blood pressure 126/83, pulse 83, temperature 99.6 ?F (37.6 ?C), temperature source Axillary, resp. rate (!) 24, height 6\' 2"  (1.88 m), weight 136.1 kg, SpO2 97 %. ?Body mass index is 38.52 kg/m?. ? ?Physical Exam ?Constitutional:   ?   General: He is not in acute distress. ?   Appearance: Normal appearance.  ?HENT:  ?   Head: Normocephalic and atraumatic.  ?Eyes:  ?   Extraocular Movements: Extraocular movements intact.  ?   Conjunctiva/sclera: Conjunctivae normal.  ?  Pulmonary:  ?   Effort: Pulmonary effort is normal. No respiratory distress.  ?Abdominal:  ?   General: There is no distension.  ?   Palpations: Abdomen is soft.  ?Musculoskeletal:  ?   Comments: Able to move his lower extremities.  ?Skin: ?   General: Skin is warm and dry.  ?    Findings: No rash.  ?Neurological:  ?   General: No focal deficit present.  ?   Mental Status: He is alert and oriented to person, place, and time.  ?Psychiatric:     ?   Mood and Affect: Mood normal.     ?   Behavior: Behavior normal.  ? ? ? ?Lab Results: ?Lab Results  ?Component Value Date  ? WBC 10.3 09/07/2021  ? HGB 12.1 (L) 09/07/2021  ? HCT 36.4 (L) 09/07/2021  ? MCV 93.8 09/07/2021  ? PLT 399 09/07/2021  ?  ?Lab Results  ?Component Value Date  ? NA 137 09/07/2021  ? K 3.6 09/07/2021  ? CO2 27 09/07/2021  ? GLUCOSE 120 (H) 09/07/2021  ? BUN 5 (L) 09/07/2021  ? CREATININE 0.80 09/07/2021  ? CALCIUM 8.4 (L) 09/07/2021  ? GFRNONAA >60 09/07/2021  ? No results found for: ALT, AST, GGT, ALKPHOS, BILITOT ? ?   ?Component Value Date/Time  ? CRP 13.6 (H) 09/04/2021 2004  ? ? ?   ?Component Value Date/Time  ? ESRSEDRATE 44 (H) 09/04/2021 2004  ? ?  ?I have reviewed the micro and lab results in Epic. ? ?Imaging: ?MR THORACIC SPINE W WO CONTRAST ? ?Result Date: 09/06/2021 ?CLINICAL DATA:  Osteomyelitis, thoracic epidural abscess EXAM: MRI THORACIC WITHOUT AND WITH CONTRAST TECHNIQUE: Multiplanar and multiecho pulse sequences of the thoracic spine were obtained without and with intravenous contrast. CONTRAST:  73mL GADAVIST GADOBUTROL 1 MMOL/ML IV SOLN COMPARISON:  MRI thoracic spine 09/04/2021 FINDINGS: Evaluation is limited by motion artifact. Alignment: No listhesis. Vertebrae: Status post interval decompression with T5-T7 laminectomy. No other acute osseous finding. No abnormal osseous enhancement. Cord: T1 hypointense, contrast enhancing material is noted adjacent to the fluid collection and along the posterior aspect of the thecal sac from T6 through T8 (series 10, image 11 and series 11, images 26-33), and extending around the lateral and anterior aspects of the thecal sac at T7-T8 (series 10, image 11 and series 11, images 32-33 and 39), concerning for epidural phlegmon. In addition, posterior to the spinal cord  in the operative bed (series 9, image 10), there is a fluid collection that measures up to 5 x 5 mm at the level of T7 (series 6, image 30). This is only directly posterior to the spinal cord at the level of C7, where it contributes to mild thecal sac narrowing but no significant cord compression. Redemonstrated pronounced epidural lipomatosis, which extends from the level of T4 through the level of T10, which effaces the thecal sac. There is otherwise improved thecal sac narrowing at T5 and T6. Although evaluation is limited by motion artifact, the cord appears normal in signal no abnormal enhancement. Paraspinal and other soft tissues: Increased T2 signal and enhancement in the operative bed and adjacent musculature, likely edema. Aforementioned fluid collection (series 9, image 10), which extends from the posterior aspect of the spinous process at T4 (series 6, image 21), and travels anteriorly through the soft tissues to reach the posterior aspect of the thecal sac at T7 (series 6, image 30), then extending inferiorly and posteriorly towards the facets at T7-T8 (series 6, image  35). Disc levels: With the exception of the T7 level, where there is mild thecal sac narrowing secondary to postsurgical fluid collection and phlegmon, there is no significant spinal canal stenosis. IMPRESSION: 1. Evaluation is limited by motion artifact. Within this limitation, patient is status post interval T5-T7 laminectomy, with findings concerning for epidural phlegmon at the levels of T7 and T8, which causes mild thecal sac narrowing at T7 in conjunction with a small postoperative fluid collection. 2. Edema in the soft tissues posterior to T5-T7, with a small fluid collection measuring up to 5 x 5 mm in the axial plane extending from the posterior aspect of the T4 spinous process to the T7-T8 facets. 3. With the exception of the T7 level, described above, there is no significant spinal canal stenosis. 4. Redemonstrated epidural  lipomatosis extending from T4 through T10, with the exception of the operative levels. Electronically Signed   By: Merilyn Baba M.D.   On: 09/06/2021 17:43  ? ?ECHOCARDIOGRAM COMPLETE ? ?Result Date: 09/07/2021 ?

## 2021-09-07 NOTE — Progress Notes (Signed)
Inpatient Rehab Admissions Coordinator:  ?Spoke with pt's wife, Jon Gills on the telephone. She acknowledged understanding of CIR goals and expectations. She is supportive of pt pursuing CIR. She confirmed that she will be able to provide support and up to  approximately Min A physical assistance. Will continue to follow. ? ? ?Wolfgang Phoenix, MS, CCC-SLP ?Admissions Coordinator ?[phone] ? ?

## 2021-09-08 ENCOUNTER — Encounter (HOSPITAL_COMMUNITY)
Admission: EM | Disposition: A | Discharge status: Other Acute Inpatient Hospital | Payer: Self-pay | Source: Home / Self Care | Attending: Neurological Surgery

## 2021-09-08 DIAGNOSIS — R338 Other retention of urine: Secondary | ICD-10-CM

## 2021-09-08 DIAGNOSIS — G9520 Unspecified cord compression: Secondary | ICD-10-CM | POA: Diagnosis not present

## 2021-09-08 DIAGNOSIS — R0683 Snoring: Secondary | ICD-10-CM | POA: Diagnosis not present

## 2021-09-08 DIAGNOSIS — A4901 Methicillin susceptible Staphylococcus aureus infection, unspecified site: Secondary | ICD-10-CM | POA: Diagnosis not present

## 2021-09-08 DIAGNOSIS — G062 Extradural and subdural abscess, unspecified: Secondary | ICD-10-CM | POA: Diagnosis not present

## 2021-09-08 LAB — BASIC METABOLIC PANEL
Anion gap: 9 (ref 5–15)
BUN: 7 mg/dL (ref 6–20)
CO2: 23 mmol/L (ref 22–32)
Calcium: 8 mg/dL — ABNORMAL LOW (ref 8.9–10.3)
Chloride: 98 mmol/L (ref 98–111)
Creatinine, Ser: 0.82 mg/dL (ref 0.61–1.24)
GFR, Estimated: >60 mL/min (ref >60)
Glucose, Bld: 392 mg/dL — ABNORMAL HIGH (ref 70–99)
Potassium: 3.4 mmol/L — ABNORMAL LOW (ref 3.5–5.1)
Sodium: 130 mmol/L — ABNORMAL LOW (ref 135–145)

## 2021-09-08 LAB — CBC
HCT: 36.5 % — ABNORMAL LOW (ref 39.0–52.0)
Hemoglobin: 12 g/dL — ABNORMAL LOW (ref 13.0–17.0)
MCH: 31.1 pg (ref 26.0–34.0)
MCHC: 32.9 g/dL (ref 30.0–36.0)
MCV: 94.6 fL (ref 80.0–100.0)
Platelets: 347 10*3/uL (ref 150–400)
RBC: 3.86 MIL/uL — ABNORMAL LOW (ref 4.22–5.81)
RDW: 13.1 % (ref 11.5–15.5)
WBC: 11.5 10*3/uL — ABNORMAL HIGH (ref 4.0–10.5)
nRBC: 0 % (ref 0.0–0.2)

## 2021-09-08 LAB — GLUCOSE, CAPILLARY: Glucose-Capillary: 133 mg/dL — ABNORMAL HIGH (ref 70–99)

## 2021-09-08 LAB — MAGNESIUM: Magnesium: 1.8 mg/dL (ref 1.7–2.4)

## 2021-09-08 LAB — PHOSPHORUS: Phosphorus: 3.1 mg/dL (ref 2.5–4.6)

## 2021-09-08 SURGERY — ECHOCARDIOGRAM, TRANSESOPHAGEAL
Anesthesia: Monitor Anesthesia Care

## 2021-09-08 MED ORDER — MORPHINE SULFATE (PF) 2 MG/ML IV SOLN: 2.0000 mg | INTRAVENOUS | Status: DC | PRN

## 2021-09-08 MED ORDER — POTASSIUM CHLORIDE CRYS ER 20 MEQ PO TBCR
40.0000 meq | EXTENDED_RELEASE_TABLET | Freq: Once | ORAL | Status: AC
Start: 2021-09-08 — End: 2021-09-08
  Administered 2021-09-08: 40 meq via ORAL
  Filled 2021-09-08: qty 2

## 2021-09-08 NOTE — Progress Notes (Signed)
?   ? ?[facility] ? ?Date of Admission:  09/04/2021    ?       ?Reason for visit: Follow up on epidural abscess ? ?Current antibiotics: ?Cefazolin 4/3--present ?  ?Previous antibiotics: ?Ceftriaxone 3/31-- 4/2 ?Vancomycin 3/31 -- 4/3 ? ?ASSESSMENT:   ? ?36 y.o. male admitted with: ? 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  No hardware placed at time of surgery.  Repeat MRI with contrast 4/2 noted possible epidural phlegmon at T7 and T8 causing mild thecal sac narrowing at T7 in conjunction with a small postoperative fluid collection as well as a redemonstrated epidural lipomatosis from T4-T10 with the exception of the operative levels.  Discussed with neurosurgery and no indications for repeat debridement at this time.  Cultures from the OR are growing MSSA.  Blood cultures 4/2 are currently no growth to date.  However, these were obtained after several days of antibiotics which may influence the ability for bacterial growth. ? ?RECOMMENDATIONS:   ? ?Continue cefazolin ?TEE ?Discussed with neurosurgery ?Will follow ? ? ?Principal Problem: ?  Epidural abscess ?Active Problems: ?  Thoracic myelopathy ?  Morbid obesity (HCC) ?  Pure hypercholesterolemia ?  Staph aureus infection ?  Cord compression [facility](HCC) ? ? ? ?MEDICATIONS:   ? ?Scheduled Meds: ? Chlorhexidine Gluconate Cloth  6 each Topical Daily  ? docusate sodium  100 mg Oral BID  ? heparin injection (subcutaneous)  5,000 Units Subcutaneous Q8H  ? pantoprazole  40 mg Oral QHS  ? potassium chloride  40 mEq Oral Once  ? rosuvastatin  10 mg Oral Daily  ? sodium chloride flush  10-40 mL Intracatheter Q12H  ? sodium chloride flush  3 mL Intravenous Q12H  ? ?Continuous Infusions: ? sodium chloride Stopped (09/05/21 0558)  ? sodium chloride Stopped  (09/06/21 2134)  ? sodium chloride Stopped (09/05/21 1630)  ?  ceFAZolin (ANCEF) IV Stopped (09/08/21 16100641)  ? methocarbamol (ROBAXIN) IV    ? norepinephrine (LEVOPHED) Adult infusion 6 mcg/min (09/08/21 0950)  ? ?PRN Meds:.acetaminophen **OR** acetaminophen, menthol-cetylpyridinium **OR** phenol, methocarbamol **OR** methocarbamol (ROBAXIN) IV, morphine injection, ondansetron **OR** ondansetron (ZOFRAN) IV, oxyCODONE, oxyCODONE, polyvinyl alcohol, sodium chloride flush ? ?SUBJECTIVE:  ? ?24 hour events:  ?No acute events noted overnight ? ?Patient reports continued improvement in sensation and ability to move his legs.  Having some difficulty urinating on his own.  No fevers. ? ?Review of Systems  ?All other systems reviewed and are negative. ? ?  ?OBJECTIVE:  ? ?Blood pressure 105/73, pulse 89, temperature 98.2 ?F (36.8 ?C), temperature source Axillary, resp. rate (!) 21, height 6\' 2"  (1.88 m), weight 136.1 kg, SpO2 96 %. ?Body mass index is 38.52 kg/m?. ? ?Physical Exam ?Constitutional:   ?   General: He is not in acute distress. ?   Appearance: Normal appearance.  ?HENT:  ?   Head: Normocephalic and atraumatic.  ?Eyes:  ?   Extraocular Movements: Extraocular movements intact.  ?   Conjunctiva/sclera: Conjunctivae normal.  ?Pulmonary:  ?   Effort: Pulmonary effort is normal. No respiratory distress.  ?Abdominal:  ?   General: There is no distension.  ?   Palpations: Abdomen is soft.  ?Musculoskeletal:  ?   Cervical back: Normal range of motion and neck supple.  ?   Comments: Able to spontaneously move his lower extremities.  Sensation is intact to light touch.  ?Skin: ?   General: Skin is warm and dry.  ?   Findings: No rash.  ?Neurological:  ?   General: No focal deficit present.  ?   Mental Status: He is alert and oriented to person, place, and time.  ?Psychiatric:     ?   Mood and Affect: Mood normal.     ?   Behavior: Behavior normal.  ? ? ? ?Lab Results: ?Lab Results  ?Component Value Date  ? WBC 11.5 (H)  09/08/2021  ? HGB 12.0 (L) 09/08/2021  ? HCT 36.5 (L) 09/08/2021  ? MCV 94.6 09/08/2021  ? PLT 347 09/08/2021  ?  ?Lab Results  ?Component Value Date  ? NA 130 (L) 09/08/2021  ? K 3.4 (L) 09/08/2021  ? CO2 23 09/08/2021  ? GLUCOSE 392 (H) 09/08/2021  ? BUN 7 09/08/2021  ? CREATININE 0.82 09/08/2021  ? CALCIUM 8.0 (L) 09/08/2021  ? GFRNONAA >60 09/08/2021  ? No results found for: ALT, AST, GGT, ALKPHOS, BILITOT ? ?   ?Component Value Date/Time  ? CRP 13.6 (H) 09/04/2021 2004  ? ? ?   ?Component Value Date/Time  ? ESRSEDRATE 44 (H) 09/04/2021 2004  ? ?  ?I have reviewed the micro and lab results in Epic. ? ?Imaging: ?MR THORACIC SPINE W WO CONTRAST ? ?Result Date: 09/06/2021 ?CLINICAL DATA:  Osteomyelitis, thoracic epidural abscess EXAM: MRI THORACIC WITHOUT AND WITH CONTRAST TECHNIQUE: Multiplanar and multiecho pulse sequences of the thoracic spine were obtained without and with intravenous contrast. CONTRAST:  79mL GADAVIST GADOBUTROL 1 MMOL/ML IV SOLN COMPARISON:  MRI thoracic spine 09/04/2021 FINDINGS: Evaluation is limited by motion artifact. Alignment: No listhesis. Vertebrae: Status post interval decompression with T5-T7 laminectomy. No other acute osseous finding. No abnormal osseous enhancement. Cord: T1 hypointense, contrast enhancing material is noted adjacent to the fluid collection and along the posterior aspect of the thecal sac from T6 through T8 (series 10, image 11 and series 11, images 26-33), and extending around the lateral and anterior aspects of the thecal sac at T7-T8 (series 10, image 11 and series 11, images 32-33 and 39), concerning for epidural phlegmon. In addition, posterior to the spinal cord in the operative bed (series 9, image 10), there is a fluid collection that measures up to 5 x 5 mm at the level of T7 (series 6, image 30). This is only directly posterior to the spinal cord at the level of C7, where it contributes to mild thecal sac narrowing but no significant cord compression.  Redemonstrated pronounced epidural lipomatosis, which extends from the level of T4 through the level of T10, which effaces the thecal sac. There is otherwise improved thecal sac narrowing at T5 and T6. Although evaluation is limited by motion artifact, the cord appears normal in signal no abnormal enhancement. Paraspinal and other soft tissues: Increased T2 signal and enhancement in the operative bed and adjacent musculature, likely edema. Aforementioned fluid collection (series 9, image 10), which extends from the posterior aspect of the spinous process at T4 (series 6, image 21), and travels anteriorly through the soft tissues to reach the posterior aspect of the thecal sac at T7 (series 6, image 30), then extending inferiorly and posteriorly towards the facets at T7-T8 (series 6, image 35). Disc levels: With the exception of the T7 level, where there is mild thecal sac narrowing secondary to postsurgical fluid collection and phlegmon, there is no significant spinal canal stenosis. IMPRESSION: 1. Evaluation is limited by  motion artifact. Within this limitation, patient is status post interval T5-T7 laminectomy, with findings concerning for epidural phlegmon at the levels of T7 and T8, which causes mild thecal sac narrowing at T7 in conjunction with a small postoperative fluid collection. 2. Edema in the soft tissues posterior to T5-T7, with a small fluid collection measuring up to 5 x 5 mm in the axial plane extending from the posterior aspect of the T4 spinous process to the T7-T8 facets. 3. With the exception of the T7 level, described above, there is no significant spinal canal stenosis. 4. Redemonstrated epidural lipomatosis extending from T4 through T10, with the exception of the operative levels. Electronically Signed   By: Wiliam Ke M.D.   On: 09/06/2021 17:43  ? ?ECHOCARDIOGRAM COMPLETE ? ?Result Date: 09/07/2021 ?   ECHOCARDIOGRAM REPORT   Patient Name:   Cristian Grant Date of Exam: 09/07/2021  Medical Rec #:  [id number]               Height:       74.0 in Accession #:    [id number]              Weight:       300.0 lb Date of Birth:  1986-05-16               BSA:          2.582 m? Patient Age:    65 y

## 2021-09-08 NOTE — Progress Notes (Signed)
?  Inpatient Rehabilitation Admissions Coordinator  ? ?I met with patient and his wife at bedside. I await medical workup and progress before beginning Auth with [employer] for CIR admit. ? ?Danne Baxter, RN, MSN ?Rehab Admissions Coordinator ?[phone] ?09/08/2021 11:10 AM ? ?

## 2021-09-08 NOTE — Progress Notes (Signed)
Chaplain consulted by care manager Lattie Haw to support pt's children as they begin to process their father's hospitalization. Chaplain met with pt and his mother at pt's bedside to introduce spiritual care services. Chaplain and pt and family made plan for follow up tomorrow morning. ? ?Please page as further needs arise. ? ?Donald Prose. Elyn Peers, M.Div. [facility] ?Chaplain ?Pager [phone] ?Office [phone] ? ?

## 2021-09-08 NOTE — Progress Notes (Signed)
Physical Therapy Treatment ?Patient Details ?Name: Cristian Grant ?MRN: [id number] ?DOB: Aug 27, 1985 ?Today's Date: 09/08/2021 ? ? ?History of Present Illness Pt is a 36 y.o. male who presented 09/03/21 with chest and upper back pain after lifting a heavy amplifier 2 days prior. Pt also with lower extremity weakness and numbness. 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 S/p T5-7 laminectomy, resection of epidural lipomatosis, and resection of synovial cyst for decompression 3/31. PMH: HLD ? ?  ?PT Comments  ? ? Patient progressing to ambulation this session and able to get to hallway with chair following close for safety.  Demonstrates R foot dragging and difficulty with sit to stand from lower surfaces.  He has pain in sitting on R periscapular area that radiates along the dermatome to his chest.  Stretches did not alleviate.  Encouraged to try to improve sitting tolerance as able and RN delivered meds during session.  PT will continue to follow acutely.  Recommend acute inpatient rehab prior to d/c home.   ?Recommendations for follow up therapy are one component of a multi-disciplinary discharge planning process, led by the attending physician.  Recommendations may be updated based on patient status, additional functional criteria and insurance authorization. ? ?Follow Up Recommendations ? Acute inpatient rehab (3hours/day) ?  ?  ?Assistance Recommended at Discharge    ?Patient can return home with the following A lot of help with walking and/or transfers;Two people to help with walking and/or transfers;A lot of help with bathing/dressing/bathroom;Assistance with cooking/housework;Assist for transportation;Help with stairs or ramp for entrance ?  ?Equipment Recommendations ? Rolling walker (2 wheels);BSC/3in1;Hospital bed;Other (comment)  ?  ?Recommendations for Other Services   ? ? ?  ?Precautions / Restrictions Precautions ?Precautions: Fall;Back   ?  ? ?Mobility ? Bed Mobility ?Overal bed mobility: Needs Assistance ?Bed Mobility: Rolling, Sidelying to Sit ?Rolling: Supervision ?Sidelying to sit: Min assist ?  ?  ?  ?General bed mobility comments: assist to lift trunk ?  ? ?Transfers ?Overall transfer level: Needs assistance ?Equipment used: Rolling walker (2 wheels), Ambulation equipment used ?Transfers: Sit to/from Stand, Bed to chair/wheelchair/BSC ?Sit to Stand: Min assist, From elevated surface, +2 safety/equipment ?  ?  ?  ?  ?  ?General transfer comment: bed to chair via stedy after sit to stand from stedy x 5 ?Transfer via Lift Equipment: Stedy ? ?Ambulation/Gait ?Ambulation/Gait assistance: +2 safety/equipment, Mod assist ?Gait Distance (Feet): 40 Feet ?Assistive device: Rolling walker (2 wheels) ?Gait Pattern/deviations: Step-to pattern, Decreased step length - right, Decreased dorsiflexion - right, Shuffle, Wide base of support ?  ?  ?  ?General Gait Details: mod A for balance, safety with chair follow, cues for R foot clearance, ? ? ?Stairs ?  ?  ?  ?  ?  ? ? ?Wheelchair Mobility ?  ? ?Modified Rankin (Stroke Patients Only) ?  ? ? ?  ?Balance Overall balance assessment: Needs assistance ?  ?Sitting balance-Leahy Scale: Fair ?  ?  ?Standing balance support: Bilateral upper extremity supported ?Standing balance-Leahy Scale: Poor ?Standing balance comment: UE support for balance ?  ?  ?  ?  ?  ?  ?  ?  ?  ?  ?  ?  ? ?  ?Cognition Arousal/Alertness: Awake/alert ?Behavior During Therapy: [facility] for tasks assessed/performed ?Overall Cognitive Status: Within Functional Limits for tasks assessed ?  ?  ?  ?  ?  ?  ?  ?  ?  ?  ?  ?  ?  ?  ?  ?  ?  ?  ?  ? ?  ?  Exercises General Exercises - Lower Extremity ?Ankle Circles/Pumps: AROM, 10 reps, Supine, 5 reps, Right, Left ?Heel Slides: AAROM, 5 reps, Both, Supine, AROM ?Hip ABduction/ADduction: AROM, Right, 5 reps, Supine ?Other Exercises ?Other Exercises: bridging with A for foot positioning x 5 in supine ?Other  Exercises: sit<>stand on Stedy x 5 ? ?  ?General Comments General comments (skin integrity, edema, etc.): BP 97 systolic EOB and pt denies symptoms ?  ?  ? ?Pertinent Vitals/Pain Pain Assessment ?Faces Pain Scale: Hurts even more ?Pain Location: R upper back in dermatome pattern around to chest ?Pain Descriptors / Indicators: Aching, Grimacing ?Pain Intervention(s): Monitored during session, Repositioned, RN gave pain meds during session  ? ? ?Home Living   ?  ?  ?  ?  ?  ?  ?  ?  ?  ?   ?  ?Prior Function    ?  ?  ?   ? ?PT Goals (current goals can now be found in the care plan section) Progress towards PT goals: Progressing toward goals ? ?  ?Frequency ? ? ? Min 5X/week ? ? ? ?  ?PT Plan Current plan remains appropriate  ? ? ?Co-evaluation   ?  ?  ?  ?  ? ?  ?AM-PAC PT "6 Clicks" Mobility   ?Outcome Measure ? Help needed turning from your back to your side while in a flat bed without using bedrails?: A Little ?Help needed moving from lying on your back to sitting on the side of a flat bed without using bedrails?: A Lot ?Help needed moving to and from a bed to a chair (including a wheelchair)?: A Lot ?Help needed standing up from a chair using your arms (e.g., wheelchair or bedside chair)?: A Lot ?Help needed to walk in hospital room?: A Lot ?Help needed climbing 3-5 steps with a railing? : Total ?6 Click Score: 12 ? ?  ?End of Session Equipment Utilized During Treatment: Gait belt ?Activity Tolerance: Patient tolerated treatment well ?Patient left: in chair;with call bell/phone within reach;with family/visitor present ?  ?PT Visit Diagnosis: Unsteadiness on feet (R26.81);Muscle weakness (generalized) (M62.81);Difficulty in walking, not elsewhere classified (R26.2);Other symptoms and signs involving the nervous system (R29.898);Pain ?Pain - Right/Left: Right ?Pain - part of body:  (upper back) ?  ? ? ?Time: 1445-1510 ?PT Time Calculation (min) (ACUTE ONLY): 25 min ? ?Charges:  $Gait Training: 8-22  mins ?$Therapeutic Activity: 8-22 mins          ?          ? ?Sheran Lawless, PT ?Acute Rehabilitation Services ?Pager:[phone] ?Office:[phone] ?09/08/2021 ? ? ? ?Elray Mcgregor ?09/08/2021, 5:29 PM ? ?

## 2021-09-08 NOTE — Progress Notes (Signed)
Occupational Therapy Treatment ?Patient Details ?Name: Cristian Grant ?MRN: [id number] ?DOB: Feb 06, 1986 ?Today's Date: 09/08/2021 ? ? ?History of present illness Pt is a 36 y.o. male who presented 09/03/21 with chest and upper back pain after lifting a heavy amplifier 2 days prior. Pt also with lower extremity weakness and numbness. 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 S/p T5-7 laminectomy, resection of epidural lipomatosis, and resection of synovial cyst for decompression 3/31. PMH: HLD ?  ?OT comments ? [name] is progressing well, he was premedicated prior to the session and only reported minor R upper back pain throughout. Pt noted to have improved bed mobility and transfers this date only requiring min A for both. He needed assist to manage his RLE throughout. Total A transfer from bed>chair in the steady frame and pt tolerated ~10 minutes of static standing with BUE supported as a precursor to OOB ADLs. No reports of dizziness throughout, VSS on RA after position changes. He continues to benefit from OT acutely. D/c recommendation remains appropriate.   ? ?Recommendations for follow up therapy are one component of a multi-disciplinary discharge planning process, led by the attending physician.  Recommendations may be updated based on patient status, additional functional criteria and insurance authorization. ?   ?Follow Up Recommendations ? Acute inpatient rehab (3hours/day)  ?  ?Assistance Recommended at Discharge Frequent or constant Supervision/Assistance  ?Patient can return home with the following ? Two people to help with walking and/or transfers;Two people to help with bathing/dressing/bathroom;Assistance with cooking/housework;Help with stairs or ramp for entrance;Assist for transportation ?  ?Equipment Recommendations ? None recommended by OT  ?  ?   ?Precautions / Restrictions Precautions ?Precautions: Fall;Back ?Precaution Booklet  Issued: No ?Precaution Comments: reviewed spinal precautions ?Restrictions ?Weight Bearing Restrictions: No  ? ? ?  ? ?Mobility Bed Mobility ?Overal bed mobility: Needs Assistance ?Bed Mobility: Rolling, Sidelying to Sit ?Rolling: Supervision ?Sidelying to sit: Min assist ?  ?  ?  ?General bed mobility comments: HOB elevated ?  ? ?Transfers ?Overall transfer level: Needs assistance ?Equipment used: Ambulation equipment used ?Transfers: Sit to/from Stand, Bed to chair/wheelchair/BSC ?Sit to Stand: Min assist, From elevated surface ?  ?  ?  ?  ?  ?General transfer comment: min A to manage RLE ?Transfer via Lift Equipment: Stedy ?  ?Balance Overall balance assessment: Needs assistance ?Sitting-balance support: No upper extremity supported, Feet supported ?Sitting balance-Leahy Scale: Fair ?  ?  ?Standing balance support: Bilateral upper extremity supported, During functional activity ?Standing balance-Leahy Scale: Poor ?Standing balance comment: statically standing in the steady frame with BUE supported, no assist from therapist ?  ?  ?  ?  ?  ?  ?  ?  ?  ?  ?  ?   ? ?ADL either performed or assessed with clinical judgement  ? ?ADL Overall ADL's : Needs assistance/impaired ?  ?  ?Grooming: Set up;Sitting ?  ?  ?  ?  ?  ?  ?  ?  ?  ?Toilet Transfer: Minimal assistance;Total assistance ?Toilet Transfer Details (indicate cue type and reason): simulated. min A for sit<>stand from elevated surface. total A for to pivotal transfer with the steady ?  ?  ?  ?  ?Functional mobility during ADLs: Minimal assistance;Total assistance ?General ADL Comments: pt able to get to the EOb with Min A and stand from elevated surface with min A and cues. total A for transfer with use fo steady. Pt contiunes  to have difficulty moving the RLE ?  ? ?Extremity/Trunk Assessment Upper Extremity Assessment ?Upper Extremity Assessment: Overall WFL for tasks assessed;RUE deficits/detail ?RUE Deficits / Details: scapular pain with ROM and  weighbearing ?  ?Lower Extremity Assessment ?Lower Extremity Assessment: Defer to PT evaluation ?  ?  ?  ? ?Vision   ?Vision Assessment?: No apparent visual deficits ?  ?Perception Perception ?Perception: Not tested ?  ?Praxis Praxis ?Praxis: Not tested ?  ? ?Cognition Arousal/Alertness: Awake/alert ?Behavior During Therapy: [facility] for tasks assessed/performed ?Overall Cognitive Status: Within Functional Limits for tasks assessed ?  ?  ?  ?  ?  ?  ?  ?  ?  ?  ?  ?  ?  ?  ?  ?  ?General Comments: alert & recalled back precautions ?  ?  ?   ?   ?   ?General Comments systolic BP 98 upon arrival in supine, 96 in sitting and 110 after transfer to the chair  ? ? ?Pertinent Vitals/ Pain       Pain Assessment ?Pain Assessment: Faces ?Faces Pain Scale: Hurts a little bit ?Pain Location: upper R back ?Pain Descriptors / Indicators: Discomfort, Grimacing, Operative site guarding, Tingling ? ? ?Frequency ? Min 2X/week  ? ? ? ? ?  ?Progress Toward Goals ? ?OT Goals(current goals can now be found in the care plan section) ? Progress towards OT goals: Progressing toward goals ? ?Acute Rehab OT Goals ?Patient Stated Goal: get better ?OT Goal Formulation: With patient ?Time For Goal Achievement: 09/19/21 ?Potential to Achieve Goals: Good ?ADL Goals ?Pt Will Perform Grooming: with supervision;sitting ?Pt Will Perform Upper Body Bathing: with set-up;sitting ?Pt Will Perform Lower Body Bathing: with mod assist;with adaptive equipment;sit to/from stand ?Pt Will Perform Upper Body Dressing: with supervision;sitting ?Pt Will Perform Lower Body Dressing: with mod assist;sit to/from stand;with adaptive equipment ?Pt Will Transfer to Toilet: with mod assist;stand pivot transfer;bedside commode ?Pt Will Perform Toileting - Clothing Manipulation and hygiene: with mod assist;sit to/from stand  ?Plan Discharge plan remains appropriate   ? ?   ?AM-PAC OT "6 Clicks" Daily Activity     ?Outcome Measure ? ? Help from another person eating meals?:  None ?Help from another person taking care of personal grooming?: A Little ?Help from another person toileting, which includes using toliet, bedpan, or urinal?: A Lot ?Help from another person bathing (including washing, rinsing, drying)?: A Lot ?Help from another person to put on and taking off regular upper body clothing?: A Little ?Help from another person to put on and taking off regular lower body clothing?: A Lot ?6 Click Score: 16 ? ?  ?End of Session Equipment Utilized During Treatment: Gait belt;Other (comment) ? ?OT Visit Diagnosis: Unsteadiness on feet (R26.81);Pain;Muscle weakness (generalized) (M62.81) ?  ?Activity Tolerance Patient tolerated treatment well ?  ?Patient Left in chair;with call bell/phone within reach;with family/visitor present ?  ?Nurse Communication Mobility status;Need for lift equipment ?  ? ?   ? ?Time: [id number] ?OT Time Calculation (min): 27 min ? ?Charges: OT General Charges ?$OT Visit: 1 Visit ?OT Treatments ?$Therapeutic Activity: 23-37 mins ? ? ? ?Davionne Mastrangelo A Zahra Peffley ?09/08/2021, 10:50 AM ?

## 2021-09-08 NOTE — Progress Notes (Signed)
? ?NAME:  Cristian Grant, MRN:  [id number], DOB:  05/19/1986, LOS: 4 ?ADMISSION DATE:  09/04/2021, CONSULTATION DATE:  3/31 ?REFERRING MD:  Dawley, REASON FOR CONSULT:  MAP goals  ? ?History of Present Illness:  ?Cristian Grant, is a 36 y.o. male, who presented to the [facility] on 3/30 with a chief complaint of thoracic back pain and bilateral lower extremity weakness which began after lifting a heavy object 2 days prior to presentation. ? 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  He was admitted to the neurosurgery service and taken to the operating room on 3/31, where he underwent a bilateral T5, T6, T7 laminectomy for decompression and a right T6/7 medial facetectomy. ? ?He was admitted to the 4 N. neurosurgical ICU postop with MAP goals greater than 80 for 4 days postop.  PCCM was consulted for assistance with MAP goals and ICU needs ? ?Pertinent  Medical History  ?HLD ? ?Significant Hospital Events: ?Including procedures, antibiotic start and stop dates in addition to other pertinent events   ?3/31 admit to NSGY, bilateral T5, T6, T7 laminectomy for decompression and a right T6/7 medial facetectomy  ?4/2 staph aureus isolated in surgical wound cultures.  ?MR t spine 4/2 with post-op fluid collection at t5-t7 with mild thecal sac narrowing. No other spinal stenosis.  ? ?Interim History / Subjective:  ?No acute events overnight. Difficulty voiding after foley removed. S/p in & out x 2.  ? ?Objective   ?Blood pressure 103/76, pulse 84, temperature 98.2 ?F (36.8 ?C), temperature source Axillary, resp. rate (!) 21, height 6\' 2"  (1.88 m), weight 136.1 kg, SpO2 95 %. ?   ?   ? ?Intake/Output Summary (Last 24 hours) at 09/08/2021 0917 ?Last data filed at 09/08/2021 0915 ?Gross per 24 hour  ?Intake 1032.54 ml  ?Output 3065 ml  ?Net -2032.46 ml  ? ? ?Filed Weights  ? 09/04/21 0925  ?Weight: 136.1 kg  ? ? ?Examination:   ?General appearance: adult male in NAD ?HEENT: PERRL no JVD. [place]/AT ?Lungs: Clear bilateral breath sounds ?CV: RRR, no MRG ?Abdomen: Soft, NT, ND ?Extremities: No acute deformity or edema  ?Skin: Grossly intact.  ?Neuro: sleepy, but arouses to good orientation.  ? ?Notable labs/studies ?- Na 130, K 3.4,  ?- Echo LVEF 70-75%, moderate LVH. No wall motion abnormalities. RV function normal. MV, AV look good on this TTE ? ?Resolved Hospital Problem list   ? ? 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 ?Movement and sensation in lower extremities seem to be improving daily.  ? ?-Post op surgical management and site management per neurosurgery ?-ABX narrowed to cefazolin. Appreciate ID assistance.  ?-Blood cultures pending  ?-TTE negative for veg. Cardiology has been contacted regarding TEE.  ?-Multimodal pain control with as needed Tylenol, Dilaudid, Robaxin, oxycodone. Bowel regimen.  ?-Needs pain management prior to PT. He is opioid naive.  ?-wean levo for MAP goal greater than 80 per NSGY until POD 7.  ?-Neurochecks per unit protocol ?-Trend fever/WBC curve ? ?Urinary retention: ?- replace foley, no retention meds at this point. May be related to his spinal injury, which is improving.  ? ?Hypokalemia ?- give K ? ?HLD ?-Continue crestor ? ?Best Practice (right click and "Reselect all SmartList Selections" daily)  ? ?Diet/type: Regular consistency (see orders) ?DVT prophylaxis:  prophylactic heparin  ?GI prophylaxis: PPI ?Lines: Central line PICC placed 4/2 ?Foley:  N/A ?Code Status:  full code ?Last date of multidisciplinary goals of care discussion [full scope] ? ?Critical care time: 33 minutes : titratable norepinephrine infusion for blood pressure augmentation.  ?  ? ? ?Joneen Roach, AGACNP-BC ?[facility] ? ?See Amion for personal pager ?PCCM on  call pager [phone] until 7pm. ?Please call Elink 7p-7a. [phone] ? ?09/08/2021 9:17 AM ? ? ? ? ? ? ?

## 2021-09-08 NOTE — Progress Notes (Addendum)
Subjective: ?Patient reports that he is continuing to have improved lower extremity sensation and strength. No acute events overnight.  ? ?Objective: ?Vital signs in last 24 hours: ?Temp:  [98.2 ?F (36.8 ?C)-99.1 ?F (37.3 ?C)] 99.1 ?F (37.3 ?C) (04/04 0400) ?Pulse Rate:  [75-95] 79 (04/04 0717) ?Resp:  [8-29] 22 (04/04 0717) ?BP: (92-138)/(64-103) 138/78 (04/04 0717) ?SpO2:  [85 %-100 %] 93 % (04/04 0717) ? ?Intake/Output from previous day: ?04/03 0701 - 04/04 0700 ?In: 1277.5 [I.V.:740.4; IV Piggyback:537.1] ?Out: 3065 [Urine:3065] ?Intake/Output this shift: ?Total I/O ?In: 37.4 [I.V.:37.4] ?Out: -  ? ?Physical Exam: Patient is awake, A/O X 4, and conversant. He is in NAD and VSS. Speech is fluent and appropriate. PERLA, EOMI. CNs grossly intact. ?LLE proximally 3/5, distally dorsiflexion/plantarflexion 4-/5 ?RLE proximally 2/5, dorsiflexion 1/5, plantarflexion 3/5 ?Sensory to light touch improved, currently decreased sensation from ankle and inferiorly ? ?Lab Results: ?Recent Labs  ?  09/07/21 ?1157 09/08/21 ?0606  ?WBC 10.3 11.5*  ?HGB 12.1* 12.0*  ?HCT 36.4* 36.5*  ?PLT 399 347  ? ?BMET ?Recent Labs  ?  09/07/21 ?2620 09/08/21 ?0606  ?NA 137 130*  ?K 3.6 3.4*  ?CL 104 98  ?CO2 27 23  ?GLUCOSE 120* 392*  ?BUN 5* 7  ?CREATININE 0.80 0.82  ?CALCIUM 8.4* 8.0*  ? ? ?Studies/Results: ?MR THORACIC SPINE W WO CONTRAST ? ?Result Date: 09/06/2021 ?CLINICAL DATA:  Osteomyelitis, thoracic epidural abscess EXAM: MRI THORACIC WITHOUT AND WITH CONTRAST TECHNIQUE: Multiplanar and multiecho pulse sequences of the thoracic spine were obtained without and with intravenous contrast. CONTRAST:  67mL GADAVIST GADOBUTROL 1 MMOL/ML IV SOLN COMPARISON:  MRI thoracic spine 09/04/2021 FINDINGS: Evaluation is limited by motion artifact. Alignment: No listhesis. Vertebrae: Status post interval decompression with T5-T7 laminectomy. No other acute osseous finding. No abnormal osseous enhancement. Cord: T1 hypointense, contrast enhancing  material is noted adjacent to the fluid collection and along the posterior aspect of the thecal sac from T6 through T8 (series 10, image 11 and series 11, images 26-33), and extending around the lateral and anterior aspects of the thecal sac at T7-T8 (series 10, image 11 and series 11, images 32-33 and 39), concerning for epidural phlegmon. In addition, posterior to the spinal cord in the operative bed (series 9, image 10), there is a fluid collection that measures up to 5 x 5 mm at the level of T7 (series 6, image 30). This is only directly posterior to the spinal cord at the level of C7, where it contributes to mild thecal sac narrowing but no significant cord compression. Redemonstrated pronounced epidural lipomatosis, which extends from the level of T4 through the level of T10, which effaces the thecal sac. There is otherwise improved thecal sac narrowing at T5 and T6. Although evaluation is limited by motion artifact, the cord appears normal in signal no abnormal enhancement. Paraspinal and other soft tissues: Increased T2 signal and enhancement in the operative bed and adjacent musculature, likely edema. Aforementioned fluid collection (series 9, image 10), which extends from the posterior aspect of the spinous process at T4 (series 6, image 21), and travels anteriorly through the soft tissues to reach the posterior aspect of the thecal sac at T7 (series 6, image 30), then extending inferiorly and posteriorly towards the facets at T7-T8 (series 6, image 35). Disc levels: With the exception of the T7 level, where there is mild thecal sac narrowing secondary to postsurgical fluid collection and phlegmon, there is no significant spinal canal stenosis. IMPRESSION: 1. Evaluation is limited  by motion artifact. Within this limitation, patient is status post interval T5-T7 laminectomy, with findings concerning for epidural phlegmon at the levels of T7 and T8, which causes mild thecal sac narrowing at T7 in conjunction  with a small postoperative fluid collection. 2. Edema in the soft tissues posterior to T5-T7, with a small fluid collection measuring up to 5 x 5 mm in the axial plane extending from the posterior aspect of the T4 spinous process to the T7-T8 facets. 3. With the exception of the T7 level, described above, there is no significant spinal canal stenosis. 4. Redemonstrated epidural lipomatosis extending from T4 through T10, with the exception of the operative levels. Electronically Signed   By: Wiliam Ke M.D.   On: 09/06/2021 17:43  ? ?ECHOCARDIOGRAM COMPLETE ? ?Result Date: 09/07/2021 ?   ECHOCARDIOGRAM REPORT   Patient Name:   Cristian Grant Date of Exam: 09/07/2021 Medical Rec #:  [id number]               Height:       74.0 in Accession #:    [id number]              Weight:       300.0 lb Date of Birth:  [birth year]               BSA:          2.582 m? Patient Age:    36 years                BP:           106/48 mmHg Patient Gender: M                       HR:           86 bpm. Exam Location:  Inpatient Procedure: 2D Echo, Cardiac Doppler, Color Doppler and Intracardiac            Opacification Agent Indications:    Endocarditis  History:        Patient has no prior history of Echocardiogram examinations.  Sonographer:    Neomia Dear RDCS Referring Phys: [id number] Ivor Costa MEIER  Sonographer Comments: Suboptimal apical window, suboptimal parasternal window, suboptimal subcostal window and patient is morbidly obese. IMPRESSIONS  1. Left ventricular ejection fraction, by estimation, is 70 to 75%. The left ventricle has hyperdynamic function. The left ventricle has no regional wall motion abnormalities. There is moderate left ventricular hypertrophy of the basal-septal segment. Left ventricular diastolic parameters were normal.  2. Right ventricular systolic function is normal. The right ventricular size is normal.  3. The mitral valve is normal in structure. No evidence of mitral valve regurgitation. No  evidence of mitral stenosis.  4. The aortic valve is normal in structure. Aortic valve regurgitation is not visualized. No aortic stenosis is present. Comparison(s): No prior Echocardiogram. Conclusion(s)/Recommendation(s): No evidence of valvular vegetations on this transthoracic echocardiogram but technically difficult with poor sound wave transmission. Consider a transesophageal echocardiogram to exclude infective endocarditis if clinically indicated. FINDINGS  Left Ventricle: Left ventricular ejection fraction, by estimation, is 70 to 75%. The left ventricle has hyperdynamic function. The left ventricle has no regional wall motion abnormalities. Definity contrast agent was given IV to delineate the left ventricular endocardial borders. The left ventricular internal cavity size was normal in size. There is moderate left ventricular hypertrophy of the basal-septal segment. Left ventricular diastolic parameters were normal. Right Ventricle: The right ventricular size  is normal. Right ventricular systolic function is normal. Left Atrium: Left atrial size was normal in size. Right Atrium: Right atrial size was normal in size. Pericardium: There is no evidence of pericardial effusion. Mitral Valve: The mitral valve is normal in structure. No evidence of mitral valve regurgitation. No evidence of mitral valve stenosis. MV peak gradient, 1.8 mmHg. The mean mitral valve gradient is 1.0 mmHg. Tricuspid Valve: The tricuspid valve is normal in structure. Tricuspid valve regurgitation is trivial. No evidence of tricuspid stenosis. Aortic Valve: The aortic valve is normal in structure. Aortic valve regurgitation is not visualized. No aortic stenosis is present. Aortic valve mean gradient measures 3.0 mmHg. Aortic valve peak gradient measures 5.3 mmHg. Aortic valve area, by VTI measures 7.98 cm?. Pulmonic Valve: The pulmonic valve was not well visualized. Pulmonic valve regurgitation is not visualized. No evidence of pulmonic  stenosis. Aorta: The aortic root is normal in size and structure. Venous: The inferior vena cava was not well visualized. IAS/Shunts: The interatrial septum was not well visualized.  LEFT VENTRICLE PLAX 2D LVIDd:

## 2021-09-09 ENCOUNTER — Inpatient Hospital Stay (HOSPITAL_COMMUNITY): Payer: BC Managed Care – PPO

## 2021-09-09 DIAGNOSIS — R0683 Snoring: Secondary | ICD-10-CM | POA: Diagnosis not present

## 2021-09-09 DIAGNOSIS — R0989 Other specified symptoms and signs involving the circulatory and respiratory systems: Secondary | ICD-10-CM

## 2021-09-09 DIAGNOSIS — A4901 Methicillin susceptible Staphylococcus aureus infection, unspecified site: Secondary | ICD-10-CM | POA: Diagnosis not present

## 2021-09-09 DIAGNOSIS — G062 Extradural and subdural abscess, unspecified: Secondary | ICD-10-CM | POA: Diagnosis not present

## 2021-09-09 DIAGNOSIS — G9520 Unspecified cord compression: Secondary | ICD-10-CM | POA: Diagnosis not present

## 2021-09-09 LAB — BASIC METABOLIC PANEL
Anion gap: 7 (ref 5–15)
BUN: 9 mg/dL (ref 6–20)
CO2: 25 mmol/L (ref 22–32)
Calcium: 8.6 mg/dL — ABNORMAL LOW (ref 8.9–10.3)
Chloride: 101 mmol/L (ref 98–111)
Creatinine, Ser: 0.87 mg/dL (ref 0.61–1.24)
GFR, Estimated: >60 mL/min (ref >60)
Glucose, Bld: 129 mg/dL — ABNORMAL HIGH (ref 70–99)
Potassium: 4 mmol/L (ref 3.5–5.1)
Sodium: 133 mmol/L — ABNORMAL LOW (ref 135–145)

## 2021-09-09 LAB — CBC
HCT: 37.8 % — ABNORMAL LOW (ref 39.0–52.0)
Hemoglobin: 11.9 g/dL — ABNORMAL LOW (ref 13.0–17.0)
MCH: 30.2 pg (ref 26.0–34.0)
MCHC: 31.5 g/dL (ref 30.0–36.0)
MCV: 95.9 fL (ref 80.0–100.0)
Platelets: 456 10*3/uL — ABNORMAL HIGH (ref 150–400)
RBC: 3.94 MIL/uL — ABNORMAL LOW (ref 4.22–5.81)
RDW: 13.2 % (ref 11.5–15.5)
WBC: 12.4 10*3/uL — ABNORMAL HIGH (ref 4.0–10.5)
nRBC: 0 % (ref 0.0–0.2)

## 2021-09-09 LAB — AEROBIC/ANAEROBIC CULTURE W GRAM STAIN (SURGICAL/DEEP WOUND)
Gram Stain: NONE SEEN
Gram Stain: NONE SEEN

## 2021-09-09 MED ORDER — GUAIFENESIN ER 600 MG PO TB12
1200.0000 mg | ORAL_TABLET | Freq: Two times a day (BID) | ORAL | Status: DC
  Administered 2021-09-09 – 2021-09-15 (×13): 1200 mg via ORAL
  Filled 2021-09-09 (×14): qty 2

## 2021-09-09 MED ORDER — ALBUTEROL SULFATE (2.5 MG/3ML) 0.083% IN NEBU: 2.5000 mg | INHALATION_SOLUTION | RESPIRATORY_TRACT | Status: DC | PRN

## 2021-09-09 MED ORDER — POLYETHYLENE GLYCOL 3350 17 G PO PACK
17.0000 g | PACK | Freq: Every day | ORAL | Status: DC
  Administered 2021-09-10 – 2021-09-15 (×6): 17 g via ORAL
  Filled 2021-09-09 (×6): qty 1

## 2021-09-09 NOTE — Progress Notes (Signed)
Pt unable to tolerate CPAP tonight. RT will cont to monitor as needed. ?

## 2021-09-09 NOTE — Progress Notes (Signed)
?   ? ?[facility] ? ?Date of Admission:  09/04/2021    ?       ?Reason for visit: Follow up on epidural abscess ?  ?Current antibiotics: ?Cefazolin 4/3--present ?  ?Previous antibiotics: ?Ceftriaxone 3/31-- 4/2 ?Vancomycin 3/31 -- 4/3 ? ? ? ?ASSESSMENT:   ? ?36 y.o. male admitted with: ? 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  No hardware placed at time of surgery.  Repeat MRI with contrast 4/2 noted possible epidural phlegmon at T7 and T8 causing mild thecal sac narrowing at T7 in conjunction with a small postoperative fluid collection as well as a redemonstrated epidural lipomatosis from T4-T10 with the exception of the operative levels.  Discussed with neurosurgery and no indications for repeat debridement at this time.  Cultures from the OR are growing MSSA.  Blood cultures 4/2 are currently no growth to date.  However, these were obtained after several days of antibiotics which may influence the ability for bacterial growth. ? ?RECOMMENDATIONS:   ? ?Continue cefazolin. ?Discussed with cardiology scheduling.  Given neurosurgery requirement for MAP greater than 80 x 7 days following surgery, anesthesiology is concerned about sedation with TEE and lower blood pressures.  Therefore, TEE will be arranged sometime next week once he is out of the 7-day postoperative window. ?Will follow ? ? ? ?Principal Problem: ?  Epidural abscess ?Active Problems: ?  Thoracic myelopathy ?  Morbid obesity ([place]) ?  Pure hypercholesterolemia ?  Staph aureus infection ?  Cord compression [facility]) ? ? ? ?MEDICATIONS:   ? ?Scheduled Meds: ? Chlorhexidine Gluconate Cloth  6 each Topical Daily  ? docusate sodium  100 mg Oral BID  ? guaiFENesin  1,200 mg Oral BID  ? heparin injection (subcutaneous)  5,000 Units  Subcutaneous Q8H  ? pantoprazole  40 mg Oral QHS  ? rosuvastatin  10 mg Oral Daily  ? sodium chloride flush  10-40 mL Intracatheter Q12H  ? sodium chloride flush  3 mL Intravenous Q12H  ? ?Continuous Infusions: ? sodium chloride Stopped (09/05/21 0558)  ? sodium chloride Stopped (09/06/21 2134)  ? sodium chloride Stopped (09/05/21 1630)  ?  ceFAZolin (ANCEF) IV Stopped (09/09/21 0556)  ? methocarbamol (ROBAXIN) IV    ? norepinephrine (LEVOPHED) Adult infusion 8 mcg/min (09/09/21 0600)  ? ?PRN Meds:.acetaminophen **OR** acetaminophen, albuterol, menthol-cetylpyridinium **OR** phenol, methocarbamol **OR** methocarbamol (ROBAXIN) IV, morphine injection, ondansetron **OR** ondansetron (ZOFRAN) IV, oxyCODONE, oxyCODONE, polyvinyl alcohol, sodium chloride flush ? ?SUBJECTIVE:  ? ?24 hour events:  ?No acute events have been noted overnight ?Remains afebrile, Tmax 99.1 ?Stable labs ?Continues on cefazolin ?Blood cultures no growth to date ?No change in surgical cultures.  Remains MSSA ?TEE pending ?Therapy recommending inpatient rehab ? ? ?Patient reports continued improvement today.  No fevers or chills.  Tolerating antibiotics.  Discussed TEE likely next week. ? ?Review of Systems  ?All other systems reviewed and are negative. ? ?  ?OBJECTIVE:  ? ?Blood pressure 118/70, pulse 82, temperature 98.8 ?F (37.1 ?C), temperature source Axillary, resp. rate 14, height 6\' 2"  (1.88 m), weight 136.1 kg, SpO2 94 %. ?Body mass index is 38.52 kg/m?. ? ?Physical Exam ?Constitutional:   ?   General: He is not in acute distress. ?   Appearance: Normal appearance.  ?HENT:  ?   Head: Normocephalic and atraumatic.  ?Eyes:  ?  Extraocular Movements: Extraocular movements intact.  ?   Conjunctiva/sclera: Conjunctivae normal.  ?Pulmonary:  ?   Effort: Pulmonary effort is normal. No respiratory distress.  ?Abdominal:  ?   General: There is no distension.  ?   Palpations: Abdomen is soft.  ?   Tenderness: There is no abdominal tenderness.   ?Musculoskeletal:  ?   Cervical back: Normal range of motion and neck supple.  ?   Comments: He is moving his lower extremities in the bed.  Foley catheter is in place.  ?Skin: ?   General: Skin is warm and dry.  ?Neurological:  ?   General: No focal deficit present.  ?   Mental Status: He is alert and oriented to person, place, and time.  ?Psychiatric:     ?   Mood and Affect: Mood normal.     ?   Behavior: Behavior normal.  ? ? ? ?Lab Results: ?Lab Results  ?Component Value Date  ? WBC 12.4 (H) 09/09/2021  ? HGB 11.9 (L) 09/09/2021  ? HCT 37.8 (L) 09/09/2021  ? MCV 95.9 09/09/2021  ? PLT 456 (H) 09/09/2021  ?  ?Lab Results  ?Component Value Date  ? NA 133 (L) 09/09/2021  ? K 4.0 09/09/2021  ? CO2 25 09/09/2021  ? GLUCOSE 129 (H) 09/09/2021  ? BUN 9 09/09/2021  ? CREATININE 0.87 09/09/2021  ? CALCIUM 8.6 (L) 09/09/2021  ? GFRNONAA >60 09/09/2021  ? No results found for: ALT, AST, GGT, ALKPHOS, BILITOT ? ?   ?Component Value Date/Time  ? CRP 13.6 (H) 09/04/2021 2004  ? ? ?   ?Component Value Date/Time  ? ESRSEDRATE 44 (H) 09/04/2021 2004  ? ?  ?I have reviewed the micro and lab results in Epic. ? ?Imaging: ?ECHOCARDIOGRAM COMPLETE ? ?Result Date: 09/07/2021 ?   ECHOCARDIOGRAM REPORT   Patient Name:   Cristian Grant Date of Exam: 09/07/2021 Medical Rec #:  [id number]               Height:       74.0 in Accession #:    [id number]              Weight:       300.0 lb Date of Birth:  1985/10/09               BSA:          2.582 m? Patient Age:    84 years                BP:           106/48 mmHg Patient Gender: M                       HR:           86 bpm. Exam Location:  Inpatient Procedure: 2D Echo, Cardiac Doppler, Color Doppler and Intracardiac            Opacification Agent Indications:    Endocarditis  History:        Patient has no prior history of Echocardiogram examinations.  Sonographer:    Luisa Hart RDCS Referring Phys: [id number] Hortencia Conradi MEIER  Sonographer Comments: Suboptimal apical window,  suboptimal parasternal window, suboptimal subcostal window and patient is morbidly obese. IMPRESSIONS  1. Left ventricular ejection fraction, by estimation, is 70 to 75%. The left ventricle has hyperdynamic function. The left ventricle has no regional wall motion abnormalities. There is moderate left ventricular  hypertrophy of the basal-septal segment. Left ventricular diastolic parameters were normal.  2. Right ventricular systolic function is normal. The right ventricular size is normal.  3. The mitral valve is normal in structure. No evidence of mitral valve regurgitation. No evidence of mitral stenosis.  4. The aortic valve is normal in structure. Aortic valve regurgitation is not visualized. No aortic stenosis is present. Comparison(s): No prior Echocardiogram. Conclusion(s)/Recommendation(s): No evidence of valvular vegetations on this transthoracic echocardiogram but technically difficult with poor sound wave transmission. Consider a transesophageal echocardiogram to exclude infective endocarditis if clinically indicated. FINDINGS  Left Ventricle: Left ventricular ejection fraction, by estimation, is 70 to 75%. The left ventricle has hyperdynamic function. The left ventricle has no regional wall motion abnormalities. Definity contrast agent was given IV to delineate the left ventricular endocardial borders. The left ventricular internal cavity size was normal in size. There is moderate left ventricular hypertrophy of the basal-septal segment. Left ventricular diastolic parameters were normal. Right Ventricle: The right ventricular size is normal. Right ventricular systolic function is normal. Left Atrium: Left atrial size was normal in size. Right Atrium: Right atrial size was normal in size. Pericardium: There is no evidence of pericardial effusion. Mitral Valve: The mitral valve is normal in structure. No evidence of mitral valve regurgitation. No evidence of mitral valve stenosis. MV peak gradient, 1.8  mmHg. The mean mitral valve gradient is 1.0 mmHg. Tricuspid Valve: The tricuspid valve is normal in structure. Tricuspid valve regurgitation is trivial. No evidence of tricuspid stenosis. Aortic Valve: The aortic valve is norma

## 2021-09-09 NOTE — Progress Notes (Signed)
CPAP set up with nasal mask. Pressure set up at 10.0 cm H20. Patient was unable to tolerate. Decreased pressure to 8.0 cm H20. Patient unable to tolerate at this time. Patient states he will attempt to wear tomorrow during the day with naps/bedtime. RT will monitor as needed. ?

## 2021-09-09 NOTE — Progress Notes (Signed)
Chaplain paged to meet with the patient's children at this time. Children are in the patient's room. ? ?Sherral Hammers RN ?

## 2021-09-09 NOTE — Progress Notes (Signed)
Physical Therapy Treatment ?Patient Details ?Name: Cristian Grant ?MRN: [id number] ?DOB: [birth year] ?Today's Date: 09/09/2021 ? ? ?History of Present Illness Pt is a 36 y.o. male who presented 09/03/21 with chest and upper back pain after lifting a heavy amplifier 2 days prior. Pt also with lower extremity weakness and numbness. 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 S/p T5-7 laminectomy, resection of epidural lipomatosis, and resection of synovial cyst for decompression 3/31. PMH: HLD ? ?  ?PT Comments  ? ? Pt demonstrates improved tolerance for ambulation at this time but remains limited in his ability to transfer without assistance. Pt with R upper back pain, which he reports limits his ability to stand from lower surfaces or without physical assistance. PT continues to encourage frequent mobilization to aide in reducing muscle stiffness and to aide in a return to independence. PT continues to recommend AIR admission at this time.   ?Recommendations for follow up therapy are one component of a multi-disciplinary discharge planning process, led by the attending physician.  Recommendations may be updated based on patient status, additional functional criteria and insurance authorization. ? ?Follow Up Recommendations ? Acute inpatient rehab (3hours/day) ?  ?  ?Assistance Recommended at Discharge Intermittent Supervision/Assistance  ?Patient can return home with the following A lot of help with bathing/dressing/bathroom;Assistance with cooking/housework;Assist for transportation;Help with stairs or ramp for entrance;A little help with walking and/or transfers ?  ?Equipment Recommendations ? Rolling walker (2 wheels);BSC/3in1  ?  ?Recommendations for Other Services   ? ? ?  ?Precautions / Restrictions Precautions ?Precautions: Fall;Back ?Precaution Booklet Issued: No ?Precaution Comments: verbally reviewed back precautions and log roll  technique ?Required Braces or Orthoses:  (no brace needed per orders) ?Restrictions ?Weight Bearing Restrictions: No  ?  ? ?Mobility ? Bed Mobility ?Overal bed mobility: Needs Assistance ?Bed Mobility: Rolling, Sidelying to Sit ?Rolling: Min guard ?Sidelying to sit: Min guard, HOB elevated ?  ?  ?  ?  ?  ? ?Transfers ?Overall transfer level: Needs assistance ?Equipment used: Rolling walker (2 wheels) ?Transfers: Sit to/from Stand ?Sit to Stand: Min assist, From elevated surface ?  ?  ?  ?  ?  ?General transfer comment: verbal cues for hand placement and increased trunk flexion ?  ? ?Ambulation/Gait ?Ambulation/Gait assistance: Min guard ?Gait Distance (Feet): 80 Feet ?Assistive device: Rolling walker (2 wheels) ?Gait Pattern/deviations: Step-through pattern, Decreased stride length, Decreased dorsiflexion - right ?Gait velocity: reduced ?Gait velocity interpretation: <1.31 ft/sec, indicative of household ambulator ?  ?General Gait Details: pt with slowed step-through gait, 2 brief standing rest breaks ? ? ?Stairs ?  ?  ?  ?  ?  ? ? ?Wheelchair Mobility ?  ? ?Modified Rankin (Stroke Patients Only) ?  ? ? ?  ?Balance Overall balance assessment: Needs assistance ?Sitting-balance support: No upper extremity supported, Feet supported ?Sitting balance-Leahy Scale: Fair ?  ?  ?Standing balance support: Bilateral upper extremity supported, Reliant on assistive device for balance ?Standing balance-Leahy Scale: Poor ?  ?  ?  ?  ?  ?  ?  ?  ?  ?  ?  ?  ?  ? ?  ?Cognition Arousal/Alertness: Awake/alert ?Behavior During Therapy: [facility] for tasks assessed/performed ?Overall Cognitive Status: Within Functional Limits for tasks assessed ?  ?  ?  ?  ?  ?  ?  ?  ?  ?  ?  ?  ?  ?  ?  ?  ?General Comments:  impaired recall of back precautions initially, is able to recall 3/3 at end of session ?  ?  ? ?  ?Exercises   ? ?  ?General Comments General comments (skin integrity, edema, etc.): VSS on RA ?  ?  ? ?Pertinent Vitals/Pain Pain  Assessment ?Pain Assessment: Faces ?Faces Pain Scale: Hurts even more ?Pain Location: R upper back at incision site ?Pain Descriptors / Indicators: Aching, Grimacing ?Pain Intervention(s): Premedicated before session  ? ? ?Home Living   ?  ?  ?  ?  ?  ?  ?  ?  ?  ?   ?  ?Prior Function    ?  ?  ?   ? ?PT Goals (current goals can now be found in the care plan section) Acute Rehab PT Goals ?Patient Stated Goal: to get better ?Progress towards PT goals: Progressing toward goals ? ?  ?Frequency ? ? ? Min 5X/week ? ? ? ?  ?PT Plan Current plan remains appropriate  ? ? ?Co-evaluation   ?  ?  ?  ?  ? ?  ?AM-PAC PT "6 Clicks" Mobility   ?Outcome Measure ? Help needed turning from your back to your side while in a flat bed without using bedrails?: A Little ?Help needed moving from lying on your back to sitting on the side of a flat bed without using bedrails?: A Little ?Help needed moving to and from a bed to a chair (including a wheelchair)?: A Little ?Help needed standing up from a chair using your arms (e.g., wheelchair or bedside chair)?: A Little ?Help needed to walk in hospital room?: A Little ?Help needed climbing 3-5 steps with a railing? : Total ?6 Click Score: 16 ? ?  ?End of Session   ?Activity Tolerance: Patient tolerated treatment well ?Patient left: in chair;with call bell/phone within reach;with chair alarm set;with family/visitor present ?Nurse Communication: Mobility status ?PT Visit Diagnosis: Unsteadiness on feet (R26.81);Muscle weakness (generalized) (M62.81);Difficulty in walking, not elsewhere classified (R26.2);Other symptoms and signs involving the nervous system (R29.898);Pain ?Pain - Right/Left: Right ?Pain - part of body:  (back) ?  ? ? ?Time: 0940-1003 ?PT Time Calculation (min) (ACUTE ONLY): 23 min ? ?Charges:  $Gait Training: 8-22 mins ?$Therapeutic Activity: 8-22 mins          ?          ? ?Arlyss Gandy, PT, DPT ?Acute Rehabilitation ?Pager: [phone] ?Office [phone] ? ? ? ?Arlyss Gandy ?09/09/2021, 10:43 AM ? ?

## 2021-09-09 NOTE — Progress Notes (Signed)
Chaplain provided support to pt's wife Jon Gills and children. Chaplain asked open ended questions to facilitate emotional expression and story telling. Chaplain normalized the feelings of overwhelm and grief during these days when their family is divided and expects to be for several more weeks. Chaplain facilitated sharing activities for pt's children and supported their mother as they work to develop coping strategies for this season. ? ?Please page as further needs arise. ? ?Maryanna Shape. Carley Hammed, M.Div. BCC ?Chaplain ?Pager [phone] ?Office [phone] ? ?

## 2021-09-09 NOTE — Progress Notes (Signed)
Subjective: ?Patient reports that he is continuing to note improvement in his symptoms. He stated that he was able to ambulate twice yesterday with assistance. No acute events overnight.  ? ?Objective: ?Vital signs in last 24 hours: ?Temp:  [97.6 ?F (36.4 ?C)-99.1 ?F (37.3 ?C)] 98.8 ?F (37.1 ?C) (04/05 0800) ?Pulse Rate:  [76-108] 82 (04/05 0700) ?Resp:  [12-30] 14 (04/05 0700) ?BP: (83-127)/(56-98) 118/70 (04/05 0700) ?SpO2:  [54 %-99 %] 94 % (04/05 0700) ? ?Intake/Output from previous day: ?04/04 0701 - 04/05 0700 ?In: 1818.1 [P.O.:840; I.V.:678.1; IV Piggyback:300] ?Out: 2975 [Urine:2975] ?Intake/Output this shift: ?No intake/output data recorded. ? ?  ?Physical Exam: Patient is awake, A/O X 4, and conversant. He is in NAD and VSS. Speech is fluent and appropriate. PERLA, EOMI. CNs grossly intact. ?LLE proximally 3/5, distally dorsiflexion/plantarflexion 4-/5 ?RLE proximally 3/5, dorsiflexion 3/5, plantarflexion 3/5 ?Sensory to light touch improved, currently decreased sensation from ankle and inferiorly ? ?Lab Results: ?Recent Labs  ?  09/08/21 ?0606 09/09/21 ?9476  ?WBC 11.5* 12.4*  ?HGB 12.0* 11.9*  ?HCT 36.5* 37.8*  ?PLT 347 456*  ? ?BMET ?Recent Labs  ?  09/08/21 ?0606 09/09/21 ?5465  ?NA 130* 133*  ?K 3.4* 4.0  ?CL 98 101  ?CO2 23 25  ?GLUCOSE 392* 129*  ?BUN 7 9  ?CREATININE 0.82 0.87  ?CALCIUM 8.0* 8.6*  ? ? ?Studies/Results: ?ECHOCARDIOGRAM COMPLETE ? ?Result Date: 09/07/2021 ?   ECHOCARDIOGRAM REPORT   Patient Name:   OLUWASEUN CREMER Date of Exam: 09/07/2021 Medical Rec #:  [id number]               Height:       74.0 in Accession #:    [id number]              Weight:       300.0 lb Date of Birth:  1986/02/22               BSA:          2.582 m? Patient Age:    36 years                BP:           106/48 mmHg Patient Gender: M                       HR:           86 bpm. Exam Location:  Inpatient Procedure: 2D Echo, Cardiac Doppler, Color Doppler and Intracardiac            Opacification Agent  Indications:    Endocarditis  History:        Patient has no prior history of Echocardiogram examinations.  Sonographer:    Neomia Dear RDCS Referring Phys: [id number] Ivor Costa MEIER  Sonographer Comments: Suboptimal apical window, suboptimal parasternal window, suboptimal subcostal window and patient is morbidly obese. IMPRESSIONS  1. Left ventricular ejection fraction, by estimation, is 70 to 75%. The left ventricle has hyperdynamic function. The left ventricle has no regional wall motion abnormalities. There is moderate left ventricular hypertrophy of the basal-septal segment. Left ventricular diastolic parameters were normal.  2. Right ventricular systolic function is normal. The right ventricular size is normal.  3. The mitral valve is normal in structure. No evidence of mitral valve regurgitation. No evidence of mitral stenosis.  4. The aortic valve is normal in structure. Aortic valve regurgitation is not visualized. No aortic stenosis is present. Comparison(s): No  prior Echocardiogram. Conclusion(s)/Recommendation(s): No evidence of valvular vegetations on this transthoracic echocardiogram but technically difficult with poor sound wave transmission. Consider a transesophageal echocardiogram to exclude infective endocarditis if clinically indicated. FINDINGS  Left Ventricle: Left ventricular ejection fraction, by estimation, is 70 to 75%. The left ventricle has hyperdynamic function. The left ventricle has no regional wall motion abnormalities. Definity contrast agent was given IV to delineate the left ventricular endocardial borders. The left ventricular internal cavity size was normal in size. There is moderate left ventricular hypertrophy of the basal-septal segment. Left ventricular diastolic parameters were normal. Right Ventricle: The right ventricular size is normal. Right ventricular systolic function is normal. Left Atrium: Left atrial size was normal in size. Right Atrium: Right atrial size was  normal in size. Pericardium: There is no evidence of pericardial effusion. Mitral Valve: The mitral valve is normal in structure. No evidence of mitral valve regurgitation. No evidence of mitral valve stenosis. MV peak gradient, 1.8 mmHg. The mean mitral valve gradient is 1.0 mmHg. Tricuspid Valve: The tricuspid valve is normal in structure. Tricuspid valve regurgitation is trivial. No evidence of tricuspid stenosis. Aortic Valve: The aortic valve is normal in structure. Aortic valve regurgitation is not visualized. No aortic stenosis is present. Aortic valve mean gradient measures 3.0 mmHg. Aortic valve peak gradient measures 5.3 mmHg. Aortic valve area, by VTI measures 7.98 cm?. Pulmonic Valve: The pulmonic valve was not well visualized. Pulmonic valve regurgitation is not visualized. No evidence of pulmonic stenosis. Aorta: The aortic root is normal in size and structure. Venous: The inferior vena cava was not well visualized. IAS/Shunts: The interatrial septum was not well visualized.  LEFT VENTRICLE PLAX 2D LVIDd:         4.90 cm    Diastology LVIDs:         3.80 cm    LV e' medial:    7.87 cm/s LV PW:         1.10 cm    LV E/e' medial:  8.0 LV IVS:        1.80 cm    LV e' lateral:   12.80 cm/s LVOT diam:     3.60 cm    LV E/e' lateral: 4.9 LV SV:         166 LV SV Index:   64 LVOT Area:     10.18 cm?  RIGHT VENTRICLE RV Basal diam:  3.30 cm RV Mid diam:    1.90 cm RV S prime:     17.60 cm/s TAPSE (M-mode): 2.2 cm LEFT ATRIUM             Index        RIGHT ATRIUM           Index LA diam:        4.20 cm 1.63 cm/m?   RA Area:     12.80 cm? LA Vol (A2C):   64.5 ml 24.98 ml/m?  RA Volume:   24.70 ml  9.57 ml/m? LA Vol (A4C):   45.9 ml 17.78 ml/m? LA Biplane Vol: 55.7 ml 21.57 ml/m?  AORTIC VALVE                    PULMONIC VALVE AV Area (Vmax):    8.24 cm?     PV Vmax:       0.61 m/s AV Area (Vmean):   8.01 cm?     PV Vmean:      42.200 cm/s AV Area (VTI):  7.98 cm?     PV VTI:        0.105 m AV Vmax:            115.00 cm/s  PV Peak grad:  1.5 mmHg AV Vmean:          80.200 cm/s  PV Mean grad:  1.0 mmHg AV VTI:            0.208 m AV Peak Grad:      5.3 mmHg AV Mean Grad:      3.0 mmHg LVOT Vmax:         93.10 cm/s LVOT Vmean:        63.100 cm/s LVOT VTI:          0.163 m LVOT/AV VTI ratio: 0.78  AORTA Ao Root diam: 3.20 cm Ao Asc diam:  3.70 cm MITRAL VALVE MV Area (PHT): 3.85 cm?    SHUNTS MV Area VTI:   9.56 cm?    Systemic VTI:  0.16 m MV Peak grad:  1.8 mmHg    Systemic Diam: 3.60 cm MV Mean grad:  1.0 mmHg MV Vmax:       0.68 m/s MV Vmean:      42.8 cm/s MV Decel Time: 197 msec MV E velocity: 63.10 cm/s MV A velocity: 58.20 cm/s MV E/A ratio:  1.08 Olga Millers MD Electronically signed by Olga Millers MD Signature Date/Time: 09/07/2021/10:20:18 AM    Final    ? ?Assessment/Plan: ?36 y.o. 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 He is continuing to recover well from his surgery. Sensation is now intact from the ankle region and inferiorly. Lower extremity strength and sensation is continuing to slowly improve. OR cultures grew MSSA. ID on board.  ?  ?  ?-TEE planned for next week ?-Continue MAP parameters greater than 80 x 7 days (end date 09/11/21) ?-Pressors per CCM ?-Heparin DVT prophylaxis ?-Pain control ?-ABX per ID ? LOS: 5 days  ? ? ? ?Council Mechanic, DNP, NP-C ?09/09/2021, 8:17 AM ? ? ? ? ?

## 2021-09-09 NOTE — Progress Notes (Signed)
? ?  NAME:   Cristian Grant, MRN:  [id number], DOB:  1985-06-19, LOS: 5 ?ADMISSION DATE:  09/04/2021, CONSULTATION DATE:  3/31 ?REFERRING MD:  Dawley, REASON FOR CONSULT:  MAP goals  ? ?History of Present Illness:  ?36 yo male presented to [facility]  with back pain and leg weakness for 2 days.  MRI showed T5-7 stenosis most severe at T6/7 due to synovial cyst and epidural lipomatosis.  Had surgery on 3/31 underwent a bilateral T5, T6, T7 laminectomy for decompression and a right T6/7 medial facetectomy.  Found to have MSSA epidural abscess.  Started on pressors to keep MAP > 80 post op to improve circulation to thoracic spine.   ? ?Pertinent  Medical History  ?HLD ? ?Significant Hospital Events: ?Including procedures, antibiotic start and stop dates in addition to other pertinent events   ?3/31 admit to [facility]GY, bilateral T5, T6, T7 laminectomy for decompression and a right T6/7 medial facetectomy  ?4/02 staph aureus isolated in surgical wound cultures.  ?MR t spine 4/2 with post-op fluid collection at t5-t7 with mild thecal sac narrowing. No other spinal stenosis.  ?4/04 trial on CPAP at night ? ?Interim History / Subjective:  ?Used CPAP for a little bit over night.  C/o chest congestion and cough this morning.  Family reports he was recently seen in urgent care for pleurisy.  He does get allergies.  No prior history of asthma. ? ?Objective   ?Blood pressure 118/70, pulse 82, temperature 98.3 ?F (36.8 ?C), temperature source Oral, resp. rate 14, height 6\' 2"  (1.88 m), weight 136.1 kg, SpO2 94 %. ?   ?   ? ?Intake/Output Summary (Last 24 hours) at 09/09/2021 0815 ?Last data filed at 09/09/2021 0600 ?Gross per 24 hour  ?Intake 1818.13 ml  ?Output 2975 ml  ?Net -1156.87 ml  ? ?Filed Weights  ? 09/04/21 0925  ?Weight: 136.1 kg  ? ? ?Examination:  ? ?General - alert ?Eyes - pupils reactive ?ENT - no sinus tenderness, no stridor ?Cardiac - regular rate/rhythm, no murmur ?Chest - equal breath sounds b/l, no wheezing or  rales ?Abdomen - soft, non tender, + bowel sounds ?Extremities - no cyanosis, clubbing, or edema ?Skin - no rashes ?Neuro - able to move legs ?Psych - normal mood and behavior ? ? ?Resolved Hospital Problem list   ? ? ?Assessment & Plan:  ? 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 ?- post op care per neurosurgery ?- continue levophed for goal MAP > 80 through 09/11/21 ? ?MSSA epidural abscess. ?- ABx per ID ?- cardiology to arrange for TEE ? ?Urine retention from neurogenic bladder. ?- might need longer term foley and urology assessment ? ?Snoring with presumed sleep apnea. ?- CPAP qhs as tolerated ?- further sleep assessment as an outpt ? ?Cough, chest congestion. ?- add mucinex ?- prn albuterol ?- incentive spirometry ?- chest xray today ? ?Hx of HLD. ?- continue crestor ? ?Best Practice (right click and "Reselect all SmartList Selections" daily)  ? ?Diet/type: Regular consistency (see orders) ?DVT prophylaxis: prophylactic heparin  ?GI prophylaxis: PPI ?Lines: Central line PICC placed 4/2 ?Foley:  N/A ?Code Status:  full code ?Last date of multidisciplinary goals of care discussion [full scope] ? ?Updated family at bedside ? ?Signature:  ?11/11/21, MD ?[facility] ?Pager - [phone] ?09/09/2021, 8:23 AM ? ? ? ? ? ? ?

## 2021-09-09 NOTE — TOC Initial Note (Addendum)
Transition of Care (TOC) - Initial/Assessment Note  ? ? ?Patient Details  ?Name: Cristian Grant ?MRN: [id number] ?Date of Birth: 01-09-1986 ? ?Transition of Care [facility] CM/SW Contact:    ?Ella Bodo, RN ?Phone Number: ?09/09/2021, 2:36 PM ? ?Clinical Narrative:                 ?Pt is a 36 y.o. male who presented 09/03/21 with chest and upper back pain after lifting a heavy amplifier 2 days prior. Pt also with lower extremity weakness and numbness. 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 S/p T5-7 laminectomy, resection of epidural lipomatosis, and resection of synovial cyst for decompression.  PTA, pt independent and living at home with wife and children, ages 43 and 73.  PT/OT recommending CIR, and rehab following for medical stability.   ?Wife concerned about how children are processing their dad's hospitalization.  Consulted pediatric chaplain, Eulas Post, who plans to meet with mom/children this afternoon between 4:30 and 5:00pm.   ? ?Expected Discharge Plan: [place] ?Barriers to Discharge: Continued Medical Work up ? ? ?  ?  ? ?Expected Discharge Plan and Services ?Expected Discharge Plan: [place] ?  ?Discharge Planning Services: CM Consult ?  ?  ?                ?  ?  ?  ?  ?  ?  ?  ?  ?  ?  ? ?Prior Living Arrangements/Services ?  ?Lives with:: Minor Children, Spouse ?Patient language and need for interpreter reviewed:: Yes ?Do you feel safe going back to the place where you live?: Yes      ?Need for Family Participation in Patient Care: Yes (Comment) ?Care giver support system in place?: Yes (comment) ?  ?Criminal Activity/Legal Involvement Pertinent to Current Situation/Hospitalization: No - Comment as needed ? ?  ?   ?   ?   ?   ? ?Emotional Assessment ?Appearance:: Appears stated age ?Attitude/Demeanor/Rapport: Engaged ?Affect (typically observed): Accepting ?Orientation: : Oriented to Self, Oriented to  Place, Oriented to  Time, Oriented to Situation ?  ?  ? 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 Diagnosis Date Noted  ? Cord compression [facility])   ? Morbid obesity ([place]) 09/06/2021  ? Pure hypercholesterolemia 09/06/2021  ? Staph aureus infection 09/06/2021  ? Epidural abscess 09/06/2021  ? 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 ?PCP:  Pcp, No ?Pharmacy:   ?[facility] [address] [facility] [address] ?[place] ?[place] [address] ?Phone: [phone] Fax: [phone] ? ? ? ? ?Social Determinants of Health (SDOH) Interventions ?  ? ?Readmission Risk Interventions ?   ? View : No data to display.  ?  ?  ?  ? ?Reinaldo Raddle, RN, BSN  ?Trauma/Neuro ICU Case Manager ?[phone] ? ? ?

## 2021-09-10 DIAGNOSIS — G062 Extradural and subdural abscess, unspecified: Secondary | ICD-10-CM | POA: Diagnosis not present

## 2021-09-10 DIAGNOSIS — J9811 Atelectasis: Secondary | ICD-10-CM

## 2021-09-10 DIAGNOSIS — A4901 Methicillin susceptible Staphylococcus aureus infection, unspecified site: Secondary | ICD-10-CM | POA: Diagnosis not present

## 2021-09-10 DIAGNOSIS — G9520 Unspecified cord compression: Secondary | ICD-10-CM | POA: Diagnosis not present

## 2021-09-10 LAB — BASIC METABOLIC PANEL
Anion gap: 7 (ref 5–15)
BUN: 8 mg/dL (ref 6–20)
CO2: 24 mmol/L (ref 22–32)
Calcium: 8.6 mg/dL — ABNORMAL LOW (ref 8.9–10.3)
Chloride: 104 mmol/L (ref 98–111)
Creatinine, Ser: 0.76 mg/dL (ref 0.61–1.24)
GFR, Estimated: >60 mL/min (ref >60)
Glucose, Bld: 124 mg/dL — ABNORMAL HIGH (ref 70–99)
Potassium: 3.9 mmol/L (ref 3.5–5.1)
Sodium: 135 mmol/L (ref 135–145)

## 2021-09-10 MED ORDER — ALTEPLASE 2 MG IJ SOLR
2.0000 mg | Freq: Once | INTRAMUSCULAR | Status: AC
  Administered 2021-09-10: 2 mg
  Filled 2021-09-10 (×2): qty 2

## 2021-09-10 MED ORDER — SENNOSIDES-DOCUSATE SODIUM 8.6-50 MG PO TABS
1.0000 | ORAL_TABLET | Freq: Every day | ORAL | Status: DC
  Administered 2021-09-10 – 2021-09-14 (×5): 1 via ORAL
  Filled 2021-09-10 (×5): qty 1

## 2021-09-10 MED ORDER — ALTEPLASE 2 MG IJ SOLR
2.0000 mg | Freq: Once | INTRAMUSCULAR | Status: DC
  Filled 2021-09-10: qty 2

## 2021-09-10 NOTE — Progress Notes (Addendum)
?   ? ?[facility] ? ?Date of Admission:  09/04/2021    ?       ?Reason for visit: Follow up on epidural abscess ? ?Current antibiotics: ?Cefazolin 4/3--present ?  ?Previous antibiotics: ?Ceftriaxone 3/31-- 4/2 ?Vancomycin 3/31 -- 4/3 ? ?ASSESSMENT:   ? ?36 y.o. male admitted with: ? 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  No hardware placed at time of surgery.  Repeat MRI with contrast 4/2 noted possible epidural phlegmon at T7 and T8 causing mild thecal sac narrowing at T7 in conjunction with a small postoperative fluid collection as well as a redemonstrated epidural lipomatosis from T4-T10 with the exception of the operative levels.  Discussed with neurosurgery and no indications for repeat debridement at this time.  Cultures from the OR grew MSSA.  Blood cultures 4/2 are currently no growth to date.  However, these were obtained after several days of antibiotics which may influence the ability for bacterial growth. ?Urinary retention: Secondary from neurogenic bladder.  Foley catheter remains in place. ?Constipation: Patient reports having flatus but has not had a bowel movement in several days. ? ?RECOMMENDATIONS:   ? ?Continue cefazolin ?TEE planned for next week ?Has Miralax and Colace ordered although did not get Miralax yesterday.  Will defer bowel regimen to primary team ?Will follow ? ? ?Principal Problem: ?  Epidural abscess ?Active Problems: ?  Thoracic myelopathy ?  Morbid obesity ([place]) ?  Pure hypercholesterolemia ?  Staph aureus infection ?  Cord compression [facility]) ? ? ? ?MEDICATIONS:   ? ?Scheduled Meds: ? Chlorhexidine Gluconate Cloth  6 each Topical Daily  ? docusate sodium  100 mg Oral BID  ? guaiFENesin  1,200 mg Oral BID  ? heparin injection (subcutaneous)  5,000 Units  Subcutaneous Q8H  ? pantoprazole  40 mg Oral QHS  ? polyethylene glycol  17 g Oral Daily  ? rosuvastatin  10 mg Oral Daily  ? sodium chloride flush  10-40 mL Intracatheter Q12H  ? sodium chloride flush  3 mL Intravenous Q12H  ? ?Continuous Infusions: ? sodium chloride Stopped (09/05/21 0558)  ? sodium chloride Stopped (09/06/21 2134)  ? sodium chloride Stopped (09/05/21 1630)  ?  ceFAZolin (ANCEF) IV 2 g (09/10/21 0509)  ? norepinephrine (LEVOPHED) Adult infusion 8 mcg/min (09/10/21 0501)  ? ?PRN Meds:.acetaminophen **OR** acetaminophen, albuterol, menthol-cetylpyridinium **OR** phenol, methocarbamol **OR** [DISCONTINUED] methocarbamol (ROBAXIN) IV, morphine injection, ondansetron **OR** ondansetron (ZOFRAN) IV, oxyCODONE, oxyCODONE, polyvinyl alcohol, sodium chloride flush ? ?SUBJECTIVE:  ? ?24 hour events:  ?No acute event ? ?Patient doing well this morning.  No fevers or chills.  Tolerating antibiotics.  Reports having flatus but no bowel movement in several days.  He is about to work with therapy and hoping to get up out of bed. ? ?Review of Systems  ?All other systems reviewed and are negative. ? ?  ?OBJECTIVE:  ? ?Blood pressure (!) 126/97, pulse 88, temperature 98.3 ?F (36.8 ?C), temperature source Oral, resp. rate (!) 21, height 6\' 2"  (1.88 m), weight 136.1 kg, SpO2 91 %. ?Body mass index is 38.52 kg/m?. ? ?Physical Exam ?Constitutional:   ?   General: He is not in acute distress. ?   Appearance: Normal appearance.  ?HENT:  ?   Head: Normocephalic and atraumatic.  ?Eyes:  ?   Extraocular Movements: Extraocular movements intact.  ?  Conjunctiva/sclera: Conjunctivae normal.  ?Pulmonary:  ?   Effort: Pulmonary effort is normal. No respiratory distress.  ?Abdominal:  ?   General: There is no distension.  ?   Palpations: Abdomen is soft.  ?   Tenderness: There is no abdominal tenderness.  ?Genitourinary: ?   Comments: Foley catheter in place. ?Musculoskeletal:  ?   Cervical back: Normal range of motion and neck  supple.  ?   Comments: He is moving his lower extremities bilaterally.  ?Neurological:  ?   General: No focal deficit present.  ?   Mental Status: He is alert and oriented to person, place, and time.  ?Psychiatric:     ?   Mood and Affect: Mood normal.     ?   Behavior: Behavior normal.  ? ? ? ?Lab Results: ?Lab Results  ?Component Value Date  ? WBC 12.4 (H) 09/09/2021  ? HGB 11.9 (L) 09/09/2021  ? HCT 37.8 (L) 09/09/2021  ? MCV 95.9 09/09/2021  ? PLT 456 (H) 09/09/2021  ?  ?Lab Results  ?Component Value Date  ? NA 135 09/10/2021  ? K 3.9 09/10/2021  ? CO2 24 09/10/2021  ? GLUCOSE 124 (H) 09/10/2021  ? BUN 8 09/10/2021  ? CREATININE 0.76 09/10/2021  ? CALCIUM 8.6 (L) 09/10/2021  ? GFRNONAA >60 09/10/2021  ? No results found for: ALT, AST, GGT, ALKPHOS, BILITOT ? ?   ?Component Value Date/Time  ? CRP 13.6 (H) 09/04/2021 2004  ? ? ?   ?Component Value Date/Time  ? ESRSEDRATE 44 (H) 09/04/2021 2004  ? ?  ?I have reviewed the micro and lab results in Epic. ? ?Imaging: ?DG Chest Port 1 View ? ?Result Date: 09/09/2021 ?CLINICAL DATA:  Chest congestion. EXAM: PORTABLE CHEST 1 VIEW COMPARISON:  September 03, 2021. FINDINGS: The heart size and mediastinal contours are within normal limits. Right-sided PICC line is noted with tip in expected position of the SVC. Hypoinflation of the lungs is noted with mild bibasilar subsegmental atelectasis. The visualized skeletal structures are unremarkable. IMPRESSION: Hypoinflation of the lungs with mild bibasilar subsegmental atelectasis. Electronically Signed   By: Marijo Conception M.D.   On: 09/09/2021 08:46    ? ?Imaging  independently reviewed in Epic.  ? ? ?Mignon Pine ?[facility] ?[place] ?[phone] pager ?09/10/2021, 9:35 AM ? ?I have personally spent 50 minutes involved in face-to-face and non-face-to-face activities for this patient on the day of the visit. Professional time spent includes the following activities: Preparing to see  the patient (review of tests), Obtaining and/or reviewing separately obtained history (admission/discharge record), Performing a medically appropriate examination and/or evaluation , Ordering medications/tests/procedures, referring and communicating with other health care professionals, Documenting clinical information in the EMR, Independently interpreting results (not separately reported), Communicating results to the patient/family/caregiver, Counseling and educating the patient/family/caregiver and Care coordination (not separately reported).  ? ? ?

## 2021-09-10 NOTE — Progress Notes (Signed)
Occupational Therapy Treatment ?Patient Details ?Name: Cristian Grant ?MRN: [id number] ?DOB: Nov 07, 1985 ?Today's Date: 09/10/2021 ? ? ?History of present illness Pt is a 36 y.o. male who presented 09/03/21 with chest and upper back pain after lifting a heavy amplifier 2 days prior. Pt also with lower extremity weakness and numbness. 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 S/p T5-7 laminectomy, resection of epidural lipomatosis, and resection of synovial cyst for decompression 3/31. PMH: HLD ?  ?OT comments ? Patient is making good progress toward all patient focused goals.  He is performing log rolls with decreasing assist, and gaining confidence with mobility.  Patient currently needing up to Min A for bed mobility and in room mobility at RW level.  Lower body weakness and continued post surgical pain are the primary deficits.  The patient will need to be shown the hip kit, but currently he is needing up to Max A for lower body ADL from a sit/stand level.  OT will continue efforts in the acute setting to maximize his functional status, and given his continued progress and motivation, AIR is recommended for post acute rehab prior to returning home.     ? ?Recommendations for follow up therapy are one component of a multi-disciplinary discharge planning process, led by the attending physician.  Recommendations may be updated based on patient status, additional functional criteria and insurance authorization. ?   ?Follow Up Recommendations ? Acute inpatient rehab (3hours/day)  ?  ?Assistance Recommended at Discharge Frequent or constant Supervision/Assistance  ?Patient can return home with the following ? Two people to help with walking and/or transfers;Two people to help with bathing/dressing/bathroom;Assistance with cooking/housework;Help with stairs or ramp for entrance;Assist for transportation ?  ?Equipment Recommendations ? None recommended by  OT  ?  ?Recommendations for Other Services   ? ?  ?Precautions / Restrictions Precautions ?Precautions: Fall;Back ?Precaution Booklet Issued: No ?Precaution Comments: able to practice log roll, and patient able to verbalize technique and back precautions. ?Restrictions ?Weight Bearing Restrictions: No  ? ? ?  ? ?Mobility Bed Mobility ?Overal bed mobility: Needs Assistance ?Bed Mobility: Sidelying to Sit ?  ?Sidelying to sit: Min assist ?  ?  ?  ?General bed mobility comments: assist to lift trunk ?  ? ?Transfers ?Overall transfer level: Needs assistance ?Equipment used: Rolling walker (2 wheels) ?Transfers: Sit to/from Stand ?Sit to Stand: From elevated surface, Min assist ?  ?  ?  ?  ?  ?  ?  ?  ?Balance Overall balance assessment: Needs assistance ?Sitting-balance support: Bilateral upper extremity supported, Feet supported ?Sitting balance-Leahy Scale: Fair ?  ?  ?Standing balance support: Reliant on assistive device for balance ?Standing balance-Leahy Scale: Poor ?  ?  ?  ?  ?  ?  ?  ?  ?  ?  ?  ?  ?   ? ?ADL either performed or assessed with clinical judgement  ? ?ADL   ?  ?  ?Grooming: Standing;Minimal assistance ?  ?  ?  ?  ?  ?Upper Body Dressing : Minimal assistance;Sitting ?  ?Lower Body Dressing: Sit to/from stand;Maximal assistance ?  ?  ?  ?  ?  ?  ?  ?Functional mobility during ADLs: Minimal assistance;Rolling walker (2 wheels) ?  ?  ? ?Extremity/Trunk Assessment Upper Extremity Assessment ?Upper Extremity Assessment: Overall WFL for tasks assessed ?  ?Lower Extremity Assessment ?Lower Extremity Assessment: Defer to PT evaluation ?  ?Cervical / Trunk  Assessment ?Cervical / Trunk Exceptions: increased body habitus, increased knee buckle to R leg ?  ? ?Vision Patient Visual Report: No change from baseline ?  ?  ?Perception Perception ?Perception: Within Functional Limits ?  ?Praxis Praxis ?Praxis: Intact ?  ? ?Cognition Arousal/Alertness: Awake/alert ?Behavior During Therapy: [facility] for tasks  assessed/performed ?Overall Cognitive Status: Within Functional Limits for tasks assessed ?  ?  ?  ?  ?  ?  ?  ?  ?  ?  ?  ?  ?  ?  ?  ?  ?  ?  ?  ?   ?Exercises   ? ?  ?Shoulder Instructions   ? ? ?  ?General Comments  VSS on RA  ? ? ?Pertinent Vitals/ Pain       Pain Assessment ?Pain Assessment: Faces ?Faces Pain Scale: Hurts even more ?Pain Location: R upper back at incision site ?Pain Descriptors / Indicators: Aching, Grimacing ?Pain Intervention(s): Monitored during session ? ?   ?  ?  ?  ?  ?  ?  ?  ?  ?  ?  ?  ?  ?  ?  ?  ?  ?  ?  ? ?  ?    ?  ?  ?  ?   ? ?Frequency ? Min 2X/week  ? ? ? ? ?  ?Progress Toward Goals ? ?OT Goals(current goals can now be found in the care plan section) ? Progress towards OT goals: Progressing toward goals ? ?Acute Rehab OT Goals ?Patient Stated Goal: Go to rehab and get stronger ?OT Goal Formulation: With patient ?Time For Goal Achievement: 09/19/21 ?Potential to Achieve Goals: Good  ?Plan Discharge plan remains appropriate   ? ?Co-evaluation ? ? ?   ?  ?  ?  ?  ? ?  ?AM-PAC OT "6 Clicks" Daily Activity     ?Outcome Measure ? ? Help from another person eating meals?: None ?Help from another person taking care of personal grooming?: A Little ?Help from another person toileting, which includes using toliet, bedpan, or urinal?: A Lot ?Help from another person bathing (including washing, rinsing, drying)?: A Lot ?Help from another person to put on and taking off regular upper body clothing?: A Little ?Help from another person to put on and taking off regular lower body clothing?: A Lot ?6 Click Score: 16 ? ?  ?End of Session Equipment Utilized During Treatment: Rolling walker (2 wheels) ? ?OT Visit Diagnosis: Unsteadiness on feet (R26.81);Pain;Muscle weakness (generalized) (M62.81) ?  ?Activity Tolerance Patient tolerated treatment well ?  ?Patient Left in chair;with call bell/phone within reach;with family/visitor present ?  ?Nurse Communication Mobility status ?  ? ?   ? ?Time:  1749-4496 ?OT Time Calculation (min): 24 min ? ?Charges: OT General Charges ?$OT Visit: 1 Visit ?OT Treatments ?$Self Care/Home Management : 23-37 mins ? ?09/10/2021 ? ?RP, OTR/L ? ?Acute Rehabilitation Services ? ?Office:  [phone] ? ? ?Joseeduardo Brix D Kimiya Brunelle ?09/10/2021, 10:32 AM ?

## 2021-09-10 NOTE — Progress Notes (Signed)
? ?  NAME:  Cristian Grant, MRN:  [id number], DOB:  September 27, 1985, LOS: 6 ?ADMISSION DATE:  09/04/2021, CONSULTATION DATE:  3/31 ?REFERRING MD:  [place], REASON FOR CONSULT:  MAP goals  ? ?History of Present Illness:  ?36 yo male presented to [facility]  with back pain and leg weakness for 2 days.  MRI showed T5-7 stenosis most severe at T6/7 due to synovial cyst and epidural lipomatosis.  Had surgery on 3/31 underwent a bilateral T5, T6, T7 laminectomy for decompression and a right T6/7 medial facetectomy.  Found to have MSSA epidural abscess.  Started on pressors to keep MAP > 80 post op to improve circulation to thoracic spine.   ? ?Pertinent  Medical History  ?HLD ? ?Significant Hospital Events: ?Including procedures, antibiotic start and stop dates in addition to other pertinent events   ?3/31 admit to NSGY, bilateral T5, T6, T7 laminectomy for decompression and a right T6/7 medial facetectomy  ?4/02 staph aureus isolated in surgical wound cultures.  ?MR t spine 4/2 with post-op fluid collection at t5-t7 with mild thecal sac narrowing. No other spinal stenosis.  ?4/04 trial on CPAP at night ? ?Interim History / Subjective:  ?Had difficulty tolerating CPAP last night.  Still has cough and feels sore in his throat and upper chest.  No sinus drainage. ? ?Objective   ?Blood pressure (!) 126/97, pulse 88, temperature (!) 97.3 ?F (36.3 ?C), temperature source Oral, resp. rate (!) 21, height 6\' 2"  (1.88 m), weight 136.1 kg, SpO2 91 %. ?   ?   ? ?Intake/Output Summary (Last 24 hours) at 09/10/2021 0811 ?Last data filed at 09/10/2021 0500 ?Gross per 24 hour  ?Intake 822.72 ml  ?Output 2110 ml  ?Net -1287.28 ml  ? ?Filed Weights  ? 09/04/21 0925  ?Weight: 136.1 kg  ? ? ?Examination:  ? ?General - alert ?Eyes - pupils reactive ?ENT - no sinus tenderness, no stridor ?Cardiac - regular rate/rhythm, no murmur ?Chest - equal breath sounds b/l, no wheezing or rales ?Abdomen - soft, non tender, + bowel sounds ?Extremities - no  cyanosis, clubbing, or edema ?Skin - no rashes ?Neuro - moves extremities, follows commands ?Psych - normal mood and behavior ? ?Resolved Hospital Problem list   ? ? ?Assessment & Plan:  ? 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 ?- post op care per neurosurgery ?- levophed to keep MAP > 80 through 09/11/21 ? ?MSSA epidural abscess. ?- ABx per ID ?- cardiology to arrange for TEE during the week of 4/10 once he is off levophed ? ?Urine retention from neurogenic bladder. ?- might need longer term foley and urology assessment ? ?Snoring with presumed sleep apnea. ?- trouble tolerating CPAP set up in hospital ?- hold off on trying CPAP again while in hospital ?- further sleep assessment as an outpt ? ?Cough, chest congestion with atelectasis. ?- scheduled mucinex ?- prn albuterol ?- incentive spirometry ?- mobilize as able ? ?Hx of HLD. ?- continue crestor ? ?Best Practice (right click and "Reselect all SmartList Selections" daily)  ? ?Diet/type: Regular consistency (see orders) ?DVT prophylaxis: prophylactic heparin  ?GI prophylaxis: PPI ?Lines: Central line PICC placed 4/2 ?Foley:  N/A ?Code Status:  full code ?Last date of multidisciplinary goals of care discussion [full scope] ? ?Signature:  ?11/11/21, MD ?[place] [facility] ?Pager - [phone] ?09/10/2021, 8:11 AM ? ? ? ? ? ? ?

## 2021-09-10 NOTE — Progress Notes (Signed)
Inpatient Rehabilitation Admissions Coordinator  ? ?I continue to follow patient's progress. Noted plans for TEE next week and remains on Levophed. ? ?Ottie Glazier, RN, MSN ?Rehab Admissions Coordinator ?[phone] ?09/10/2021 11:11 AM ? ?

## 2021-09-10 NOTE — Progress Notes (Addendum)
Subjective: ?Patient reports continued improvement in his BLE sensation. Most severe area of paresthesia in his bilateral heels and ankles with a tingling sensation in his toes bilaterally. He ambulated twice yesterday with assistance. No acute events overnight.  ? ?Objective: ?Vital signs in last 24 hours: ?Temp:  [97.3 ?F (36.3 ?C)-98.6 ?F (37 ?C)] 97.3 ?F (36.3 ?C) (04/06 0400) ?Pulse Rate:  [81-96] 88 (04/06 0700) ?Resp:  [12-30] 21 (04/06 0700) ?BP: (98-138)/(56-97) 126/97 (04/06 0700) ?SpO2:  [86 %-97 %] 91 % (04/06 0700) ? ?Intake/Output from previous day: ?04/05 0701 - 04/06 0700 ?In: 877.8 [I.V.:677.1; IV Piggyback:200.7] ?Out: 2110 [Urine:2110] ?Intake/Output this shift: ?No intake/output data recorded. ? ?Physical Exam: Patient is awake, A/O X 4, and conversant. He is in NAD and VSS. Speech is fluent and appropriate. PERLA, EOMI. CNs grossly intact. ?LLE proximally 3/5, distally dorsiflexion/plantarflexion 4/5 ?RLE proximally 3/5, dorsiflexion 4-/5, plantarflexion 4-/5 ?Sensory to light touch improved, currently decreased sensation from ankle and inferiorly ? ?Lab Results: ?Recent Labs  ?  09/08/21 ?0606 09/09/21 ?5573  ?WBC 11.5* 12.4*  ?HGB 12.0* 11.9*  ?HCT 36.5* 37.8*  ?PLT 347 456*  ? ?BMET ?Recent Labs  ?  09/09/21 ?2202 09/10/21 ?5427  ?NA 133* 135  ?K 4.0 3.9  ?CL 101 104  ?CO2 25 24  ?GLUCOSE 129* 124*  ?BUN 9 8  ?CREATININE 0.87 0.76  ?CALCIUM 8.6* 8.6*  ? ? ?Studies/Results: ?DG Chest Port 1 View ? ?Result Date: 09/09/2021 ?CLINICAL DATA:  Chest congestion. EXAM: PORTABLE CHEST 1 VIEW COMPARISON:  September 03, 2021. FINDINGS: The heart size and mediastinal contours are within normal limits. Right-sided PICC line is noted with tip in expected position of the SVC. Hypoinflation of the lungs is noted with mild bibasilar subsegmental atelectasis. The visualized skeletal structures are unremarkable. IMPRESSION: Hypoinflation of the lungs with mild bibasilar subsegmental atelectasis. Electronically Signed    By: Lupita Raider M.D.   On: 09/09/2021 08:46   ? ?Assessment/Plan: ?36 y.o. 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 OR cultures grew MSSA. ID on board. He is continuing to recover well from his surgery and notes improvement in his sensation and function. Sensation from the ankle region and inferiorly continuing to improve with sensation returning in his toes with a mild tingling sensation. Bilateral heels and ankles with som improvement but moderate paresthesia persists. Lower extremity strength is continuing to slowly improve. ?  ?  ?-TEE planned for next week, cards to arrange  ?-Continue MAP parameters greater than 80 x 7 days (end date 09/11/21) ?-Pressors per CCM ?-Heparin DVT prophylaxis ?-Pain control ?-ABX per ID ?-Continue foley due to neurogenic bladder ? LOS: 6 days  ? ? ? ?Council Mechanic, DNP, NP-C ?09/10/2021, 8:06 AM ? ? ? ?Addendum ? ?Pt s/e, agree with above ? ?Thank you for allowing me to participate in this patient's care.  Please do not hesitate to call with questions or concerns. ? ? ?Orlanda Lemmerman, DO ?Neurosurgeon ?[facility] ?Cell: [phone] ? ?

## 2021-09-10 NOTE — Progress Notes (Signed)
Physical Therapy Treatment ?Patient Details ?Name: Cristian Grant ?MRN: [id number] ?DOB: [birth year] ?Today's Date: 09/10/2021 ? ? ?History of Present Illness Pt is a 36 y.o. male who presented 09/03/21 with chest and upper back pain after lifting a heavy amplifier 2 days prior. Pt also with lower extremity weakness and numbness. 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 S/p T5-7 laminectomy, resection of epidural lipomatosis, and resection of synovial cyst for decompression 3/31. PMH: HLD ? ?  ?PT Comments  ? ? Pt tolerates treatment well with improved ambulation endurance with use of walker. Pt attempts ambulation without UE support, requiring some physical assist to maintain balance over a short distance. Pt continues to demonstrate LE weakness with knee instability noted, and will need to demonstrate improved LE strength to return to independent ambulation and stair negotiation. PT continues to recommend AIR admission.   ?Recommendations for follow up therapy are one component of a multi-disciplinary discharge planning process, led by the attending physician.  Recommendations may be updated based on patient status, additional functional criteria and insurance authorization. ? ?Follow Up Recommendations ? Acute inpatient rehab (3hours/day) ?  ?  ?Assistance Recommended at Discharge Intermittent Supervision/Assistance  ?Patient can return home with the following A lot of help with bathing/dressing/bathroom;Assistance with cooking/housework;Assist for transportation;Help with stairs or ramp for entrance;A little help with walking and/or transfers ?  ?Equipment Recommendations ? Rolling walker (2 wheels);BSC/3in1  ?  ?Recommendations for Other Services   ? ? ?  ?Precautions / Restrictions Precautions ?Precautions: Fall;Back ?Precaution Booklet Issued: No ?Precaution Comments: verbal cues for back precautions ?Restrictions ?Weight Bearing Restrictions:  No  ?  ? ?Mobility ? Bed Mobility ?Overal bed mobility: Needs Assistance ?Bed Mobility: Sit to Sidelying, Rolling ?Rolling: Min assist ?  ?  ?  ?Sit to sidelying: Mod assist ?General bed mobility comments: pt requires assist for LE management when returning to bed ?  ? ?Transfers ?Overall transfer level: Needs assistance ?Equipment used: Rolling walker (2 wheels) ?Transfers: Sit to/from Stand ?Sit to Stand: Min assist ?  ?  ?  ?  ?  ?General transfer comment: increased time and effort. Pt stands from recliner and bed x 2 ?  ? ?Ambulation/Gait ?Ambulation/Gait assistance: Min guard ?Gait Distance (Feet): 120 Feet ?Assistive device: Rolling walker (2 wheels) ?Gait Pattern/deviations: Step-through pattern, Knee hyperextension - right ?Gait velocity: reduced ?Gait velocity interpretation: <1.8 ft/sec, indicate of risk for recurrent falls ?  ?General Gait Details: pt with slowed step-through gait, intermittent R knee hyperextension with 2 instances of mild knee buckling which pt corrects with UE support. Pt also ambulates 3' forward at edge of bed with minA, no UE support ? ? ?Stairs ?  ?  ?  ?  ?  ? ? ?Wheelchair Mobility ?  ? ?Modified Rankin (Stroke Patients Only) ?  ? ? ?  ?Balance Overall balance assessment: Needs assistance ?Sitting-balance support: No upper extremity supported, Feet supported ?Sitting balance-Leahy Scale: Fair ?  ?  ?Standing balance support: Single extremity supported, Bilateral upper extremity supported, Reliant on assistive device for balance ?Standing balance-Leahy Scale: Poor ?Standing balance comment: reliant on UE support of minG-minA ?  ?  ?  ?  ?  ?  ?  ?  ?  ?  ?  ?  ? ?  ?Cognition Arousal/Alertness: Awake/alert ?Behavior During Therapy: [facility] for tasks assessed/performed ?Overall Cognitive Status: Within Functional Limits for tasks assessed ?  ?  ?  ?  ?  ?  ?  ?  ?  ?  ?  ?  ?  ?  ?  ?  ?  ?  ?  ? ?  ?  Exercises General Exercises - Lower Extremity ?Ankle Circles/Pumps: AROM, Both, 15  reps ?Gluteal Sets: AROM, Both, 5 reps ?Long Arc Quad: AROM, Both, 10 reps ?Heel Slides: AROM, Both, 5 reps ?Hip ABduction/ADduction: AROM, Both, 5 reps ? ?  ?General Comments General comments (skin integrity, edema, etc.): VSS on RA ?  ?  ? ?Pertinent Vitals/Pain Pain Assessment ?Pain Assessment: 0-10 ?Pain Score: 4  ?Pain Location: back ?Pain Descriptors / Indicators: Aching ?Pain Intervention(s): Monitored during session  ? ? ?Home Living   ?  ?  ?  ?  ?  ?  ?  ?  ?  ?   ?  ?Prior Function    ?  ?  ?   ? ?PT Goals (current goals can now be found in the care plan section) Acute Rehab PT Goals ?Patient Stated Goal: to get better ?Progress towards PT goals: Progressing toward goals ? ?  ?Frequency ? ? ? Min 5X/week ? ? ? ?  ?PT Plan Current plan remains appropriate  ? ? ?Co-evaluation   ?  ?  ?  ?  ? ?  ?AM-PAC PT "6 Clicks" Mobility   ?Outcome Measure ? Help needed turning from your back to your side while in a flat bed without using bedrails?: A Little ?Help needed moving from lying on your back to sitting on the side of a flat bed without using bedrails?: A Little ?Help needed moving to and from a bed to a chair (including a wheelchair)?: A Little ?Help needed standing up from a chair using your arms (e.g., wheelchair or bedside chair)?: A Little ?Help needed to walk in hospital room?: A Little ?Help needed climbing 3-5 steps with a railing? : Total ?6 Click Score: 16 ? ?  ?End of Session   ?Activity Tolerance: Patient tolerated treatment well ?Patient left: in bed;with call bell/phone within reach;with family/visitor present ?Nurse Communication: Mobility status ?PT Visit Diagnosis: Unsteadiness on feet (R26.81);Muscle weakness (generalized) (M62.81);Difficulty in walking, not elsewhere classified (R26.2);Other symptoms and signs involving the nervous system (R29.898);Pain ?Pain - Right/Left: Right ?Pain - part of body:  (back) ?  ? ? ?Time: 4158-3094 ?PT Time Calculation (min) (ACUTE ONLY): 40 min ? ?Charges:   $Gait Training: 8-22 mins ?$Therapeutic Exercise: 8-22 mins ?$Therapeutic Activity: 8-22 mins          ?          ? ?Arlyss Gandy, PT, DPT ?Acute Rehabilitation ?Pager: [phone] ?Office [phone] ? ? ? ?Arlyss Gandy ?09/10/2021, 1:41 PM ? ?

## 2021-09-11 DIAGNOSIS — G9520 Unspecified cord compression: Secondary | ICD-10-CM | POA: Diagnosis not present

## 2021-09-11 DIAGNOSIS — J9811 Atelectasis: Secondary | ICD-10-CM | POA: Diagnosis not present

## 2021-09-11 DIAGNOSIS — G062 Extradural and subdural abscess, unspecified: Secondary | ICD-10-CM | POA: Diagnosis not present

## 2021-09-11 DIAGNOSIS — A4901 Methicillin susceptible Staphylococcus aureus infection, unspecified site: Secondary | ICD-10-CM | POA: Diagnosis not present

## 2021-09-11 LAB — CULTURE, BLOOD (ROUTINE X 2)
Culture: NO GROWTH
Culture: NO GROWTH
Special Requests: ADEQUATE
Special Requests: ADEQUATE

## 2021-09-11 MED ORDER — TAMSULOSIN HCL 0.4 MG PO CAPS
0.4000 mg | ORAL_CAPSULE | Freq: Every day | ORAL | Status: DC
  Administered 2021-09-11 – 2021-09-15 (×5): 0.4 mg via ORAL
  Filled 2021-09-11 (×5): qty 1

## 2021-09-11 MED ORDER — ALTEPLASE 2 MG IJ SOLR
2.0000 mg | Freq: Once | INTRAMUSCULAR | Status: AC
  Administered 2021-09-11: 2 mg
  Filled 2021-09-11: qty 2

## 2021-09-11 NOTE — Progress Notes (Signed)
Subjective: ?The patient is alert and pleasant.  He has no complaints.  He is pleased with his progress. ? ?Objective: ?Vital signs in last 24 hours: ?Temp:  [97.7 ?F (36.5 ?C)-99 ?F (37.2 ?C)] 98 ?F (36.7 ?C) (04/07 0800) ?Pulse Rate:  [85-115] 85 (04/07 0800) ?Resp:  [11-32] 11 (04/07 0800) ?BP: (100-145)/(50-106) 110/62 (04/07 0800) ?SpO2:  [90 %-97 %] 94 % (04/07 0800) ?Estimated body mass index is 38.52 kg/m? as calculated from the following: ?  Height as of this encounter: 6\' 2"  (1.88 m). ?  Weight as of this encounter: 136.1 kg. ? ? ?Intake/Output from previous day: ?04/06 0701 - 04/07 0700 ?In: 1050.5 [P.O.:440; I.V.:310.5; IV Piggyback:300.1] ?Out: 2425 [Urine:2425] ?Intake/Output this shift: ?No intake/output data recorded. ? ?Physical exam the patient is alert and pleasant.  His wound is healing well.  I remove the dressing.  His lower extremity strength is normal. ? ?Lab Results: ?Recent Labs  ?  09/09/21 ?11/09/21  ?WBC 12.4*  ?HGB 11.9*  ?HCT 37.8*  ?PLT 456*  ? ?BMET ?Recent Labs  ?  09/09/21 ?11/09/21 09/10/21 ?11/10/21  ?NA 133* 135  ?K 4.0 3.9  ?CL 101 104  ?CO2 25 24  ?GLUCOSE 129* 124*  ?BUN 9 8  ?CREATININE 0.87 0.76  ?CALCIUM 8.6* 8.6*  ? ? ?Studies/Results: ?No results found. ? ?Assessment/Plan: ?Thoracic epidural abscess, status postlaminectomy: The patient is doing well.  I will transfer him to the floor.  We are awaiting rehab. ? LOS: 7 days  ? ? ? ?2751 ?09/11/2021, 9:36 AM ? ? ? ? ? ?

## 2021-09-11 NOTE — Progress Notes (Addendum)
Physical Therapy Treatment ?Patient Details ?Name: Cristian Grant ?MRN: [id number] ?DOB: 1985-07-21 ?Today's Date: 09/11/2021 ? ? ?History of Present Illness Pt is a 36 y.o. male who presented 09/03/21 with chest and upper back pain after lifting a heavy amplifier 2 days prior. Pt also with lower extremity weakness and numbness. 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 S/p T5-7 laminectomy, resection of epidural lipomatosis, and resection of synovial cyst for decompression 3/31. PMH: HLD ? ?  ?PT Comments  ? ? Pt tolerates treatment well with increased ambulation distance, progressing to initiation of stair training. Pt continues to demonstrate R knee buckling and instability with fatigue, benefiting from use of RW to maintain stability and prevent falls. Pt has a flight of stairs at home to reach bedroom and bathroom and will need to demonstrate the ability to tolerate 15 steps in order to return home safely. Pt was independent and working prior to admission and demonstrates the potential to make significant functional gains with high intensity inpatient PT services, with there hope of returning to independent mobility without an assistive device.  ?Recommendations for follow up therapy are one component of a multi-disciplinary discharge planning process, led by the attending physician.  Recommendations may be updated based on patient status, additional functional criteria and insurance authorization. ? ?Follow Up Recommendations ? Acute inpatient rehab (3hours/day) ?  ?  ?Assistance Recommended at Discharge Intermittent Supervision/Assistance  ?Patient can return home with the following A lot of help with bathing/dressing/bathroom;Assistance with cooking/housework;Assist for transportation;Help with stairs or ramp for entrance;A little help with walking and/or transfers ?  ?Equipment Recommendations ? Rolling walker (2 wheels);BSC/3in1  ?   ?Recommendations for Other Services   ? ? ?  ?Precautions / Restrictions Precautions ?Precautions: Fall;Back ?Precaution Booklet Issued: No ?Precaution Comments: pt able to recall all back precautions, performs log roll well ?Required Braces or Orthoses:  (no brace needed) ?Restrictions ?Weight Bearing Restrictions: No  ?  ? ?Mobility ? Bed Mobility ?Overal bed mobility: Needs Assistance ?Bed Mobility: Rolling, Sidelying to Sit ?Rolling: Min guard ?Sidelying to sit: Min guard, HOB elevated ?  ?  ?  ?General bed mobility comments: use of bed rail ?  ? ?Transfers ?Overall transfer level: Needs assistance ?Equipment used: Rolling walker (2 wheels) ?Transfers: Sit to/from Stand ?Sit to Stand: Min guard ?  ?  ?  ?  ?  ?  ?  ? ?Ambulation/Gait ?Ambulation/Gait assistance: Min guard, Min assist ?Gait Distance (Feet): 250 Feet (additional trial of 120') ?Assistive device: Rolling walker (2 wheels) ?Gait Pattern/deviations: Step-through pattern, Knee hyperextension - right, Knees buckling ?Gait velocity: reduced ?Gait velocity interpretation: <1.31 ft/sec, indicative of household ambulator ?  ?General Gait Details: pt with slowed step-through gait, more consistent R knee hyperextension with fatigue with intermittent buckling. Pt requiring minA with knee buckling and minA with brief 10' with use of only finger tips on walker to assist in balance ? ? ?Stairs ?Stairs: Yes ?Stairs assistance: Min assist ?Stair Management: One rail Right, Step to pattern, Sideways ?Number of Stairs: 4 ?General stair comments: step-to pattern, forward ascending, sideways descending, one instance of R knee buckle when descending step ? ? ?Wheelchair Mobility ?  ? ?Modified Rankin (Stroke Patients Only) ?  ? ? ?  ?Balance Overall balance assessment: Needs assistance ?Sitting-balance support: No upper extremity supported, Feet supported ?Sitting balance-Leahy Scale: Fair ?  ?  ?Standing balance support: Single extremity supported, Reliant on  assistive device for balance ?Standing  balance-Leahy Scale: Poor ?Standing balance comment: finger tip support of walker ?  ?  ?  ?  ?  ?  ?  ?  ?  ?  ?  ?  ? ?  ?Cognition Arousal/Alertness: Awake/alert ?Behavior During Therapy: [facility] for tasks assessed/performed ?Overall Cognitive Status: Within Functional Limits for tasks assessed ?  ?  ?  ?  ?  ?  ?  ?  ?  ?  ?  ?  ?  ?  ?  ?  ?  ?  ?  ? ?  ?Exercises Other Exercises ?Other Exercises: mini-squats from standing, 5 reps x 2 trials ? ?  ?General Comments General comments (skin integrity, edema, etc.): VSS on RA ?  ?  ? ?Pertinent Vitals/Pain Pain Assessment ?Pain Assessment: Faces ?Faces Pain Scale: Hurts even more ?Pain Location: back ?Pain Descriptors / Indicators: Grimacing ?Pain Intervention(s): Monitored during session  ? ? ?Home Living   ?  ?  ?  ?  ?  ?  ?  ?  ?  ?   ?  ?Prior Function    ?  ?  ?   ? ?PT Goals (current goals can now be found in the care plan section) Acute Rehab PT Goals ?Patient Stated Goal: to get better ?Progress towards PT goals: Progressing toward goals ? ?  ?Frequency ? ? ? Min 5X/week ? ? ? ?  ?PT Plan Current plan remains appropriate  ? ? ?Co-evaluation   ?  ?  ?  ?  ? ?  ?AM-PAC PT "6 Clicks" Mobility   ?Outcome Measure ? Help needed turning from your back to your side while in a flat bed without using bedrails?: A Little ?Help needed moving from lying on your back to sitting on the side of a flat bed without using bedrails?: A Little ?Help needed moving to and from a bed to a chair (including a wheelchair)?: A Little ?Help needed standing up from a chair using your arms (e.g., wheelchair or bedside chair)?: A Little ?Help needed to walk in hospital room?: A Little ?Help needed climbing 3-5 steps with a railing? : Total ?6 Click Score: 16 ? ?  ?End of Session   ?Activity Tolerance: Patient tolerated treatment well ?Patient left: in chair;with call bell/phone within reach;with chair alarm set ?Nurse Communication: Mobility status ?PT  Visit Diagnosis: Unsteadiness on feet (R26.81);Muscle weakness (generalized) (M62.81);Difficulty in walking, not elsewhere classified (R26.2);Other symptoms and signs involving the nervous system (R29.898);Pain ?Pain - Right/Left: Right ?  ? ? ?Time: 4650-3546 ?PT Time Calculation (min) (ACUTE ONLY): 34 min ? ?Charges:  $Gait Training: 23-37 mins          ?          ? ?Arlyss Gandy, PT, DPT ?Acute Rehabilitation ?Pager: [phone] ?Office [phone] ? ? ? ?Arlyss Gandy ?09/11/2021, 11:14 AM ? ?

## 2021-09-11 NOTE — Progress Notes (Signed)
? ? ?  [facility] has been requested to perform a transesophageal echocardiogram on 09/11/2021 for epidural abscess with MSSA.  After careful review of history and examination, the risks and benefits of transesophageal echocardiogram have been explained including risks of esophageal damage, perforation (1:10,000 risk), bleeding, pharyngeal hematoma as well as other potential complications associated with anesthesia including aspiration, arrhythmia, respiratory failure and death. Alternatives to treatment were discussed, questions were answered. Patient is willing to proceed.  ? ?Patient admitted with epidural abscess underwent back surgery. Culture grew MSSA. Vital stable. No significant anemia. Platelet normal.  ? ?Azalee Course, PA-C ?09/11/2021 6:43 PM  ? ?

## 2021-09-11 NOTE — Progress Notes (Signed)
? ?NAME:  Cristian Grant, MRN:  [id number], DOB:  May 17, 1986, LOS: 7 ?ADMISSION DATE:  09/04/2021, CONSULTATION DATE:  3/31 ?REFERRING MD:  Dawley, REASON FOR CONSULT:  MAP goals  ? ?History of Present Illness:  ?36 yo male presented to [facility]  with back pain and leg weakness for 2 days.  MRI showed T5-7 stenosis most severe at T6/7 due to synovial cyst and epidural lipomatosis.  Had surgery on 3/31 underwent a bilateral T5, T6, T7 laminectomy for decompression and a right T6/7 medial facetectomy.  Found to have MSSA epidural abscess.  Started on pressors to keep MAP > 80 post op to improve circulation to thoracic spine.   ? ?Pertinent Medical History:  ?HLD ? ?Significant Hospital Events: ?Including procedures, antibiotic start and stop dates in addition to other pertinent events   ?3/31 admit to [facility]GY, bilateral T5, T6, T7 laminectomy for decompression and a right T6/7 medial facetectomy  ?4/02 staph aureus isolated in surgical wound cultures.  ?MR t spine 4/2 with post-op fluid collection at t5-t7 with mild thecal sac narrowing. No other spinal stenosis.  ?4/04 trial on CPAP at night ?4/7 Improved strength, off of NE since 4/6 ~1755, MAP remains > 80 without support. Stable for floor per NSGY. ? ?Interim History / Subjective:  ?No significant events overnight ?Slept well overnight, reports right upper back tightness/tension this morning ?No other significant pain ?Mild upper airway congestion that seems to be worse in the mornings, resolves throughout the day ?MAP remains > 80 without blood pressure support, NE off since 4/6PM ?Per NSGY note, stable for floor transfer today ?PCCM will sign off ? ?Objective:  ?Blood pressure 110/62, pulse 85, temperature 98 ?F (36.7 ?C), temperature source Oral, resp. rate 11, height 6\' 2"  (1.88 m), weight 136.1 kg, SpO2 94 %. ?   ?   ? ?Intake/Output Summary (Last 24 hours) at 09/11/2021 0900 ?Last data filed at 09/11/2021 0600 ?Gross per 24 hour  ?Intake 861.54 ml  ?Output  2425 ml  ?Net -1563.46 ml  ? ? ?Filed Weights  ? 09/04/21 0925  ?Weight: 136.1 kg  ? ?Physical Examination: ?General: Overall well-appearing young man in NAD. ?HEENT: [place]/AT, anicteric sclera, PERRL, moist mucous membranes. ?Neuro: Awake, oriented x 4. Responds to verbal stimuli. Following commands consistently. Moves all 4 extremities spontaneously. Equal BLE strength. ?CV: RRR, no m/g/r. ?PULM: Breathing even and unlabored on RA. Lung fields CTAB. ?GI: Soft, nontender, nondistended. Normoactive bowel sounds. ?Extremities: No LE edema noted. ?Skin: Warm/dry, no rashes. ? ?Resolved Hospital Problem List:  ? ? ?Assessment & Plan:  ? 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 ?MSSA epidural abscess ?- Antibiotic therapy per ID ?- Continue Ancef ?- Cardiology to arrange for TEE week of 4/10 ? ?Urine retention from neurogenic bladder ?- Continue to monitor with recovery from surgery ?- May need longer term solution for retention/Foley ? ?Snoring with presumed sleep apnea ?Patient had trouble tolerating CPAP set up in hospital ?- Outpatient sleep study/assessment once recovered from surgery ? ?Cough, chest congestion with atelectasis ?- Albuterol PRN ?- Mucinex PRN ?- Pulmonary hygiene with IS, mobilization ? ?Hx of HLD ?- Continue Crestor ? ?Patient continues to improve postoperatively with stable hemodynamics.  MAP has remained greater than 80 without blood pressure support.  No further critical care needs at this time.  Per NSGY, plan to transfer to floor today.  PCCM will sign off, please feel free to reach out if we can be of further assistance. ? ?Best Practice (right click and "Reselect all SmartList Selections" daily)  ? ?Per Primary Team ? ?Signature:  ? ?Tim Lair, PA-C ?[facility] ?09/11/21 9:00  AM ? ?Please see [email or web address] for pager details. ? ?From 7A-7P if no response, please call [phone] ?After hours, please call ELink [phone] ?

## 2021-09-11 NOTE — Progress Notes (Signed)
Patient refuses CPAP for HS. 

## 2021-09-11 NOTE — Progress Notes (Signed)
PICC line occluded. Repositioned patient. Seems that laying flat is the best way. Both lumens flushed easily with blood return. ?

## 2021-09-11 NOTE — Progress Notes (Signed)
?   ? ?[facility] ? ?Date of Admission:  09/04/2021    ?       ?Reason for visit: Follow up on epidural abscess ?  ?Current antibiotics: ?Cefazolin 4/3--present ?  ?Previous antibiotics: ?Ceftriaxone 3/31-- 4/2 ?Vancomycin 3/31 -- 4/3 ? ?ASSESSMENT:   ? ?36 y.o. male admitted with: ? 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  No hardware placed at time of surgery.  Repeat MRI with contrast 4/2 noted possible epidural phlegmon at T7 and T8 causing mild thecal sac narrowing at T7 in conjunction with a small postoperative fluid collection as well as a redemonstrated epidural lipomatosis from T4-T10 with the exception of the operative levels.  No indications for repeat debridement at this time.  Cultures from the OR grew MSSA.  Blood cultures 4/2 are currently negative.  However, these were obtained after several days of antibiotics which may influence the ability for bacterial growth. ? ?RECOMMENDATIONS:   ? ?Continue cefazolin ?TEE next week ?Dr Luciana Axe available as needed over the weekend, otherwise I will return on Monday. ? ? ?Principal Problem: ?  Epidural abscess ?Active Problems: ?  Thoracic myelopathy ?  Morbid obesity (HCC) ?  Pure hypercholesterolemia ?  Staph aureus infection ?  Cord compression [facility]) ? ? ? ?MEDICATIONS:   ? ?Scheduled Meds: ? alteplase  2 mg Intracatheter Once  ? Chlorhexidine Gluconate Cloth  6 each Topical Daily  ? guaiFENesin  1,200 mg Oral BID  ? heparin injection (subcutaneous)  5,000 Units Subcutaneous Q8H  ? pantoprazole  40 mg Oral QHS  ? polyethylene glycol  17 g Oral Daily  ? rosuvastatin  10 mg Oral Daily  ? senna-docusate  1 tablet Oral QHS  ? sodium chloride flush  10-40 mL Intracatheter Q12H  ? ?Continuous Infusions: ? sodium chloride Stopped (09/05/21 1630)   ?  ceFAZolin (ANCEF) IV Stopped (09/11/21 0225)  ? norepinephrine (LEVOPHED) Adult infusion Stopped (09/10/21 1955)  ? ?PRN Meds:.acetaminophen **OR** acetaminophen, albuterol, menthol-cetylpyridinium **OR** phenol, methocarbamol **OR** [DISCONTINUED] methocarbamol (ROBAXIN) IV, morphine injection, ondansetron **OR** ondansetron (ZOFRAN) IV, oxyCODONE, oxyCODONE, polyvinyl alcohol ? ?SUBJECTIVE:  ? ?24 hour events:  ?No acute events noted overnight ?Afebrile, Tmax 99 ?No new labs this morning ? ? ?Patient reports that he walked a little on his own yesterday.  He still has some tingling in his foot.  He reports his surgical bandage was removed today.  He is currently off norepinephrine.  No fevers. ? ?Review of Systems  ?All other systems reviewed and are negative. ? ?  ?OBJECTIVE:  ? ?Blood pressure 110/62, pulse 85, temperature 98 ?F (36.7 ?C), temperature source Oral, resp. rate 11, height 6\' 2"  (1.88 m), weight 136.1 kg, SpO2 94 %. ?Body mass index is 38.52 kg/m?. ? ?Physical Exam ?Constitutional:   ?   General: He is not in acute distress. ?   Appearance: Normal appearance.  ?HENT:  ?   Head: Normocephalic and atraumatic.  ?Eyes:  ?   Extraocular Movements: Extraocular movements intact.  ?   Conjunctiva/sclera: Conjunctivae normal.  ?Pulmonary:  ?   Effort: Pulmonary effort is normal. No respiratory distress.  ?Abdominal:  ?   General: There is no distension.  ?   Palpations: Abdomen is soft.  ?Musculoskeletal:  ?   Comments: Foley catheter in place ?PICC line in place ?Voluntarily moving his lower  extremities  ?Neurological:  ?   General: No focal deficit present.  ?   Mental Status: He is alert and oriented to person, place, and time.  ?Psychiatric:     ?   Mood and Affect: Mood normal.     ?   Behavior: Behavior normal.  ? ? ? ?Lab Results: ?Lab Results  ?Component Value Date  ? WBC 12.4 (H) 09/09/2021  ? HGB 11.9 (L) 09/09/2021  ? HCT 37.8 (L) 09/09/2021  ? MCV 95.9 09/09/2021  ? PLT 456 (H) 09/09/2021  ?   ?Lab Results  ?Component Value Date  ? NA 135 09/10/2021  ? K 3.9 09/10/2021  ? CO2 24 09/10/2021  ? GLUCOSE 124 (H) 09/10/2021  ? BUN 8 09/10/2021  ? CREATININE 0.76 09/10/2021  ? CALCIUM 8.6 (L) 09/10/2021  ? GFRNONAA >60 09/10/2021  ? No results found for: ALT, AST, GGT, ALKPHOS, BILITOT ? ?   ?Component Value Date/Time  ? CRP 13.6 (H) 09/04/2021 2004  ? ? ?   ?Component Value Date/Time  ? ESRSEDRATE 44 (H) 09/04/2021 2004  ? ?  ?I have reviewed the micro and lab results in Epic. ? ?Imaging: ?No results found.  ? ?Imaging independently reviewed in Epic.  ? ? ?Kathlynn Grate ?[facility] ?[facility] ?[phone] pager ?09/11/2021, 9:12 AM ? ? ?

## 2021-09-11 NOTE — Progress Notes (Signed)
Inpatient Rehabilitation Admissions Coordinator  ? ?I met at bedside with patient . I will begin Auth with [employer] for possible admit next week pending approval and medical workup completion. Noted TEE pending and we need clarification of when TEE is scheduled. We will follow up Monday. ? ?Danne Baxter, RN, MSN ?Rehab Admissions Coordinator ?[phone] ?09/11/2021 11:39 AM ? ?

## 2021-09-11 NOTE — H&P (Incomplete)
Attempted report x 3. Bed not clean initially, nurse unavailable, and then nurse at lunch. I asked her to just give be a call back when her lunch was finished. ?

## 2021-09-12 NOTE — Progress Notes (Signed)
Physical Therapy Treatment ?Patient Details ?Name: Cristian Grant ?MRN: [id number] ?DOB: [birth year] ?Today's Date: 09/12/2021 ? ? ?History of Present Illness Pt is a 36 y.o. male who presented 09/03/21 with chest and upper back pain after lifting a heavy amplifier 2 days prior. Pt also with lower extremity weakness and numbness. 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 S/p T5-7 laminectomy, resection of epidural lipomatosis, and resection of synovial cyst for decompression 3/31. PMH: HLD ? ?  ?PT Comments  ? ? Pt required min assist bed mobility, min guard assist sit to stand from elevated surface, and min assist ambulation 250' with RW. Steppage gait noted with occasional knee buckling. Heavy reliance on RW. Current POC remains appropriate. Pt scheduled to undergo TEE Monday, 4/10. ?   ?Recommendations for follow up therapy are one component of a multi-disciplinary discharge planning process, led by the attending physician.  Recommendations may be updated based on patient status, additional functional criteria and insurance authorization. ? ?Follow Up Recommendations ? Acute inpatient rehab (3hours/day) ?  ?  ?Assistance Recommended at Discharge Intermittent Supervision/Assistance  ?Patient can return home with the following A lot of help with bathing/dressing/bathroom;Assistance with cooking/housework;Assist for transportation;Help with stairs or ramp for entrance;A little help with walking and/or transfers ?  ?Equipment Recommendations ? Rolling walker (2 wheels);BSC/3in1  ?  ?Recommendations for Other Services   ? ? ?  ?Precautions / Restrictions Precautions ?Precautions: Fall;Back ?Precaution Comments: reviewed 3/3 back precautions ?Restrictions ?Other Position/Activity Restrictions: no brace needed  ?  ? ?Mobility ? Bed Mobility ?Overal bed mobility: Needs Assistance ?Bed Mobility: Rolling, Sidelying to Sit, Sit to Sidelying ?Rolling: Min  guard ?Sidelying to sit: HOB elevated, Min assist ?  ?  ?Sit to sidelying: Min assist ?General bed mobility comments: +rail, increased time, assist to elevate trunk, assist with BLE back to bed ?  ? ?Transfers ?Overall transfer level: Needs assistance ?Equipment used: Rolling walker (2 wheels) ?Transfers: Sit to/from Stand ?Sit to Stand: Min guard, From elevated surface ?  ?  ?  ?  ?  ?General transfer comment: cues for hand placement and sequencing, increased time ?  ? ?Ambulation/Gait ?Ambulation/Gait assistance: Min assist ?Gait Distance (Feet): 250 Feet ?Assistive device: Rolling walker (2 wheels) ?Gait Pattern/deviations: Step-through pattern, Steppage, Knees buckling ?Gait velocity: decreased ?Gait velocity interpretation: <1.31 ft/sec, indicative of household ambulator ?  ?General Gait Details: mild steppage gait due to decreased sensation/proprioception BLE. Heavy reliance on RW. ? ? ?Stairs ?  ?  ?  ?  ?  ? ? ?Wheelchair Mobility ?  ? ?Modified Rankin (Stroke Patients Only) ?  ? ? ?  ?Balance Overall balance assessment: Needs assistance ?Sitting-balance support: No upper extremity supported, Feet supported ?Sitting balance-Leahy Scale: Good ?  ?  ?Standing balance support: During functional activity, Bilateral upper extremity supported, Reliant on assistive device for balance ?Standing balance-Leahy Scale: Poor ?  ?  ?  ?  ?  ?  ?  ?  ?  ?  ?  ?  ?  ? ?  ?Cognition Arousal/Alertness: Awake/alert ?Behavior During Therapy: [facility] for tasks assessed/performed ?Overall Cognitive Status: Within Functional Limits for tasks assessed ?  ?  ?  ?  ?  ?  ?  ?  ?  ?  ?  ?  ?  ?  ?  ?  ?  ?  ?  ? ?  ?Exercises Other Exercises ?Other Exercises: Pt c/o ankles/calves feeling tight. Educated on  heel cord stretch using looped sheet as well as ankle pumps. ? ?  ?General Comments   ?  ?  ? ?Pertinent Vitals/Pain Pain Assessment ?Pain Assessment: Faces ?Faces Pain Scale: Hurts little more ?Pain Location: back ?Pain Descriptors /  Indicators: Grimacing, Discomfort, Operative site guarding ?Pain Intervention(s): Monitored during session, Repositioned, Premedicated before session  ? ? ?Home Living   ?  ?  ?  ?  ?  ?  ?  ?  ?  ?   ?  ?Prior Function    ?  ?  ?   ? ?PT Goals (current goals can now be found in the care plan section) Acute Rehab PT Goals ?Patient Stated Goal: rehab then home ?Progress towards PT goals: Progressing toward goals ? ?  ?Frequency ? ? ? Min 5X/week ? ? ? ?  ?PT Plan Current plan remains appropriate  ? ? ?Co-evaluation   ?  ?  ?  ?  ? ?  ?AM-PAC PT "6 Clicks" Mobility   ?Outcome Measure ? Help needed turning from your back to your side while in a flat bed without using bedrails?: A Little ?Help needed moving from lying on your back to sitting on the side of a flat bed without using bedrails?: A Little ?Help needed moving to and from a bed to a chair (including a wheelchair)?: A Little ?Help needed standing up from a chair using your arms (e.g., wheelchair or bedside chair)?: A Little ?Help needed to walk in hospital room?: A Little ?Help needed climbing 3-5 steps with a railing? : Total ?6 Click Score: 16 ? ?  ?End of Session Equipment Utilized During Treatment: Gait belt ?Activity Tolerance: Patient tolerated treatment well ?Patient left: in bed;with call bell/phone within reach;with family/visitor present ?Nurse Communication: Mobility status ?PT Visit Diagnosis: Unsteadiness on feet (R26.81);Muscle weakness (generalized) (M62.81);Difficulty in walking, not elsewhere classified (R26.2);Other symptoms and signs involving the nervous system (R29.898);Pain ?Pain - Right/Left: Right ?  ? ? ?Time: 0093-8182 ?PT Time Calculation (min) (ACUTE ONLY): 18 min ? ?Charges:  $Gait Training: 8-22 mins          ?          ? ?Aida Raider, PT  ?Office # [phone] ?Pager [phone] ? ? ? ?Ilda Foil ?09/12/2021, 3:35 PM ? ?

## 2021-09-12 NOTE — Progress Notes (Signed)
Patient ID: Cristian Grant, male   DOB: June 14, 1985, 36 y.o.   MRN: [id number] ?BP 125/74 (BP Location: Left Arm)   Pulse 87   Temp 98.9 ?F (37.2 ?C) (Oral)   Resp 18   Ht 6\' 2"  (1.88 m)   Wt 136.1 kg   SpO2 94%   BMI 38.52 kg/m?  ?Alert and oriented x 4 ?Moving lower extremities well ?Right lower extremity slightly weaker than left ?Improving ?Wound is clean, and dry ?

## 2021-09-13 ENCOUNTER — Inpatient Hospital Stay (HOSPITAL_COMMUNITY): Payer: BC Managed Care – PPO

## 2021-09-13 IMAGING — DX DG CHEST 1V PORT
1 series · 1 of 1 positions shown · non-contrast
Comparison: [DATE]

CLINICAL DATA: PICC line placement.

EXAM:
PORTABLE CHEST 1 VIEW

[chest]
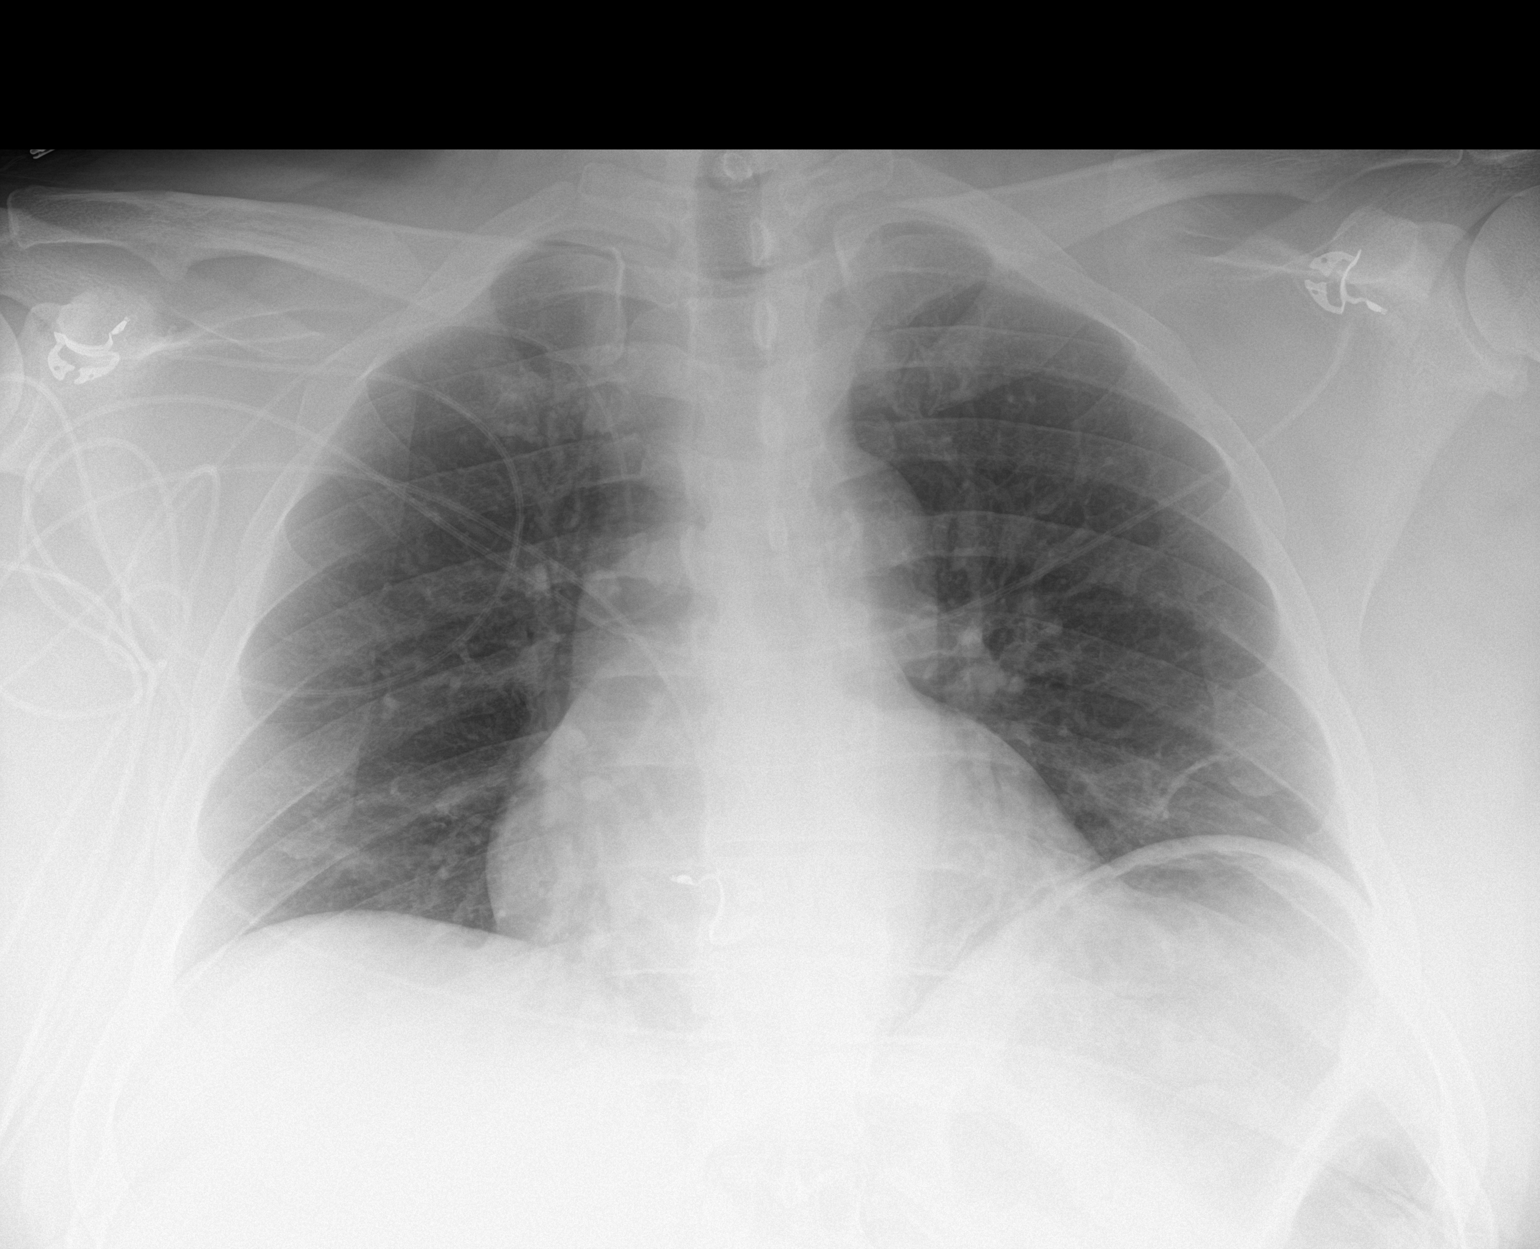

[1 of 1 positions shown; findings below may reference images not displayed]

FINDINGS: A right-sided PICC line is seen. Its distal tip is noted within the
distal aspect of the superior vena cava, approximately 1.5 cm
proximal to the junction of the superior vena cava and right atrium.
Very mild, stable atelectatic changes are seen within the bilateral
lung bases. There is no evidence of acute infiltrate, pleural
effusion or pneumothorax. The heart size and mediastinal contours
are within normal limits. The visualized skeletal structures are
unremarkable.
IMPRESSION: PICC line positioning, as described above, without evidence of acute
or active cardiopulmonary disease.

## 2021-09-13 MED ORDER — ALTEPLASE 2 MG IJ SOLR
2.0000 mg | Freq: Once | INTRAMUSCULAR | Status: DC
  Filled 2021-09-13: qty 2

## 2021-09-13 NOTE — Progress Notes (Signed)
At bedside for PICC line check due to possible occlusion. Caps changed. Unable to flush NS in red and purple ports. Stat chest film ordered to verify placement, as PICC has been TPA'd numerous times over the week. RN and patient aware. Nothing infusing through the PICC currently. ?

## 2021-09-13 NOTE — Progress Notes (Signed)
Patient ID: Cristian Grant, male   DOB: [birth year], 36 y.o.   MRN: [id number] ?BP 127/80 (BP Location: Left Arm)   Pulse 90   Temp 97.7 ?F (36.5 ?C) (Oral)   Resp 20   Ht 6\' 2"  (1.88 m)   Wt 136.1 kg   SpO2 93%   BMI 38.52 kg/m?  ?Alert and oriented x 4, speech is clear and fluent ?Moving extremities better, wound is clean and dry ?Pt/Ot to continue working with him ?

## 2021-09-13 NOTE — Progress Notes (Signed)
CPAP refused. No unit in room. ?

## 2021-09-14 ENCOUNTER — Inpatient Hospital Stay (HOSPITAL_COMMUNITY): Payer: BC Managed Care – PPO

## 2021-09-14 ENCOUNTER — Inpatient Hospital Stay (HOSPITAL_COMMUNITY): Payer: BC Managed Care – PPO | Admitting: Anesthesiology

## 2021-09-14 ENCOUNTER — Encounter (HOSPITAL_COMMUNITY): Payer: Self-pay | Admitting: Neurological Surgery

## 2021-09-14 ENCOUNTER — Encounter (HOSPITAL_COMMUNITY)
Admission: EM | Disposition: A | Discharge status: Other Acute Inpatient Hospital | Payer: Self-pay | Source: Home / Self Care | Attending: Neurological Surgery

## 2021-09-14 DIAGNOSIS — I341 Nonrheumatic mitral (valve) prolapse: Secondary | ICD-10-CM

## 2021-09-14 HISTORY — PX: TEE WITHOUT CARDIOVERSION: SHX5443

## 2021-09-14 LAB — CBC
HCT: 37.9 % — ABNORMAL LOW (ref 39.0–52.0)
Hemoglobin: 12.2 g/dL — ABNORMAL LOW (ref 13.0–17.0)
MCH: 30.2 pg (ref 26.0–34.0)
MCHC: 32.2 g/dL (ref 30.0–36.0)
MCV: 93.8 fL (ref 80.0–100.0)
Platelets: 419 10*3/uL — ABNORMAL HIGH (ref 150–400)
RBC: 4.04 MIL/uL — ABNORMAL LOW (ref 4.22–5.81)
RDW: 12.8 % (ref 11.5–15.5)
WBC: 8.3 10*3/uL (ref 4.0–10.5)
nRBC: 0 % (ref 0.0–0.2)

## 2021-09-14 LAB — BASIC METABOLIC PANEL
Anion gap: 8 (ref 5–15)
BUN: 12 mg/dL (ref 6–20)
CO2: 24 mmol/L (ref 22–32)
Calcium: 9.2 mg/dL (ref 8.9–10.3)
Chloride: 103 mmol/L (ref 98–111)
Creatinine, Ser: 0.88 mg/dL (ref 0.61–1.24)
GFR, Estimated: >60 mL/min (ref >60)
Glucose, Bld: 99 mg/dL (ref 70–99)
Potassium: 4.2 mmol/L (ref 3.5–5.1)
Sodium: 135 mmol/L (ref 135–145)

## 2021-09-14 SURGERY — ECHOCARDIOGRAM, TRANSESOPHAGEAL
Anesthesia: Monitor Anesthesia Care

## 2021-09-14 MED ORDER — PROPOFOL 10 MG/ML IV BOLUS
INTRAVENOUS | Status: DC | PRN
  Administered 2021-09-14: 100 mg via INTRAVENOUS
  Administered 2021-09-14: 30 mg via INTRAVENOUS

## 2021-09-14 MED ORDER — GLYCOPYRROLATE 0.2 MG/ML IJ SOLN
INTRAMUSCULAR | Status: DC | PRN
  Administered 2021-09-14: .2 mg via INTRAVENOUS

## 2021-09-14 MED ORDER — PROPOFOL 500 MG/50ML IV EMUL
INTRAVENOUS | Status: DC | PRN
  Administered 2021-09-14: 75 ug/kg/min via INTRAVENOUS

## 2021-09-14 MED ORDER — LIDOCAINE 2% (20 MG/ML) 5 ML SYRINGE
INTRAMUSCULAR | Status: DC | PRN
  Administered 2021-09-14: 100 mg via INTRAVENOUS

## 2021-09-14 MED ORDER — LACTATED RINGERS IV SOLN
INTRAVENOUS | Status: AC | PRN
  Administered 2021-09-14: 1000 mL via INTRAVENOUS

## 2021-09-14 NOTE — CV Procedure (Signed)
? ? ?  Transesophageal Echocardiogram Note ? ?Cristian Grant ?[id number] ?02-10-1986 ? ?Procedure: Transesophageal Echocardiogram ?Indications: bacteremia ? ? ?Procedure Details ?Consent: Obtained ?Time Out: Verified patient identification, verified procedure, site/side was marked, verified correct patient position, special equipment/implants available, Radiology Safety Procedures followed,  medications/allergies/relevent history reviewed, required imaging and test results available.  Performed ? ?Medications: ? ?During this procedure the patient is administered Lidocaine 100 mg IV followed by Proporol drip 230 mg total for  sedation.   The patient's heart rate, blood pressure, and oxygen saturation are monitored continuously during the procedure. The period of conscious sedation is 30  minutes, of which I was present face-to-face 100% of this time. ? ?Left Ventrical:  normal LV function  ? ?Mitral Valve: trivial MR, no vegetation  ? ?Aortic Valve: normal 3 leaflet valve, no vegetation  ? ?Tricuspid Valve: normal valve , no vegetation  ? ?Pulmonic Valve: normal valve, no vegetation  ? ?Left Atrium/ Left atrial appendage: no thrombi  ? ?Atrial septum: intact,  no PFO or ASD by color duplex  ? ?Aorta: normal  ? ? ?Complications: No apparent complications ?Patient did tolerate procedure well. ? ? ?Vesta Mixer, Montez Hageman., MD, [facility] ?09/14/2021, 10:32 AM ? ? ? ?

## 2021-09-14 NOTE — Progress Notes (Signed)
Physical Therapy Treatment ?Patient Details ?Name: Cristian Grant ?MRN: [id number] ?DOB: [birth year] ?Today's Date: 09/14/2021 ? ? ?History of Present Illness Pt is a 36 y.o. male who presented 09/03/21 with chest and upper back pain after lifting a heavy amplifier 2 days prior. Pt also with lower extremity weakness and numbness. 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 S/p T5-7 laminectomy, resection of epidural lipomatosis, and resection of synovial cyst for decompression 3/31. PMH: HLD ? ?  ?PT Comments  ? ? Pt making steady improvements toward goals, still limited by "pockets of numbness" and incoordination.  Emphasis on reinforcing advised prec/education, transitions, sit to stands/safety and progression of gait stability/quality and stamina. ?  ?Recommendations for follow up therapy are one component of a multi-disciplinary discharge planning process, led by the attending physician.  Recommendations may be updated based on patient status, additional functional criteria and insurance authorization. ? ?Follow Up Recommendations ? Acute inpatient rehab (3hours/day) ?  ?  ?Assistance Recommended at Discharge Intermittent Supervision/Assistance  ?Patient can return home with the following A little help with walking and/or transfers;A lot of help with bathing/dressing/bathroom;Assist for transportation;Help with stairs or ramp for entrance;Assistance with cooking/housework ?  ?Equipment Recommendations ? Rolling walker (2 wheels);BSC/3in1  ?  ?Recommendations for Other Services   ? ? ?  ?Precautions / Restrictions Precautions ?Precautions: Back;Fall ?Precaution Comments: reviewed 3/3 back precautions ?Restrictions ?Other Position/Activity Restrictions: no brace needed  ?  ? ?Mobility ? Bed Mobility ?Overal bed mobility: Needs Assistance ?  ?Rolling: Min guard ?Sidelying to sit: Min assist ?  ?  ?  ?General bed mobility comments: rail with extra time,  plus stability until arm positioned to assist up. ?  ? ?Transfers ?Overall transfer level: Needs assistance ?Equipment used: Rolling walker (2 wheels) ?Transfers: Sit to/from Stand ?Sit to Stand: Min guard (very elevated surface) ?  ?  ?  ?  ?  ?General transfer comment: cues for hand placement/safety ?  ? ?Ambulation/Gait ?Ambulation/Gait assistance: Min assist ?Gait Distance (Feet): 250 Feet (1 stop over half way point to recover from degraded gait pattern and SOB.) ?Assistive device: Rolling walker (2 wheels) ?Gait Pattern/deviations: Step-through pattern ?Gait velocity: decreased ?Gait velocity interpretation: <1.8 ft/sec, indicate of risk for recurrent falls ?  ?General Gait Details: mildly unsteady with moderate use of the RW.  At best a weak heel/toe pattern with consistent soft hyperextension and mild knee "wobble" ant/post.  Degrading to foot slap and circumduction with fatigue or lowered focus. ? ? ?Stairs ?  ?  ?  ?  ?  ? ? ?Wheelchair Mobility ?  ? ?Modified Rankin (Stroke Patients Only) ?  ? ? ?  ?Balance   ?  ?Sitting balance-Leahy Scale: Good ?  ?  ?Standing balance support: During functional activity, Bilateral upper extremity supported, Reliant on assistive device for balance ?Standing balance-Leahy Scale: Poor ?Standing balance comment: reliant on AD ?  ?  ?  ?  ?  ?  ?  ?  ?  ?  ?  ?  ? ?  ?Cognition Arousal/Alertness: Awake/alert ?Behavior During Therapy: [facility] for tasks assessed/performed ?Overall Cognitive Status: Within Functional Limits for tasks assessed ?  ?  ?  ?  ?  ?  ?  ?  ?  ?  ?  ?  ?  ?  ?  ?  ?  ?  ?  ? ?  ?Exercises   ? ?  ?General Comments   ?  ?  ? ?  Pertinent Vitals/Pain Pain Assessment ?Pain Assessment: Faces ?Faces Pain Scale: Hurts little more ?Pain Location: back ?Pain Descriptors / Indicators: Discomfort ?Pain Intervention(s): Monitored during session  ? ? ?Home Living   ?  ?  ?  ?  ?  ?  ?  ?  ?  ?   ?  ?Prior Function    ?  ?  ?   ? ?PT Goals (current goals can now be found  in the care plan section) Acute Rehab PT Goals ?Patient Stated Goal: rehab then home ?PT Goal Formulation: With patient ?Time For Goal Achievement: 09/19/21 ?Potential to Achieve Goals: Good ?Progress towards PT goals: Progressing toward goals ? ?  ?Frequency ? ? ? Min 5X/week ? ? ? ?  ?PT Plan Current plan remains appropriate  ? ? ?Co-evaluation   ?  ?  ?  ?  ? ?  ?AM-PAC PT "6 Clicks" Mobility   ?Outcome Measure ? Help needed turning from your back to your side while in a flat bed without using bedrails?: A Little ?Help needed moving from lying on your back to sitting on the side of a flat bed without using bedrails?: A Little ?Help needed moving to and from a bed to a chair (including a wheelchair)?: A Little ?Help needed standing up from a chair using your arms (e.g., wheelchair or bedside chair)?: A Little ?Help needed to walk in hospital room?: A Little ?Help needed climbing 3-5 steps with a railing? : Total ?6 Click Score: 16 ? ?  ?End of Session   ?Activity Tolerance: Patient tolerated treatment well ?Patient left: in chair;with call bell/phone within reach ?Nurse Communication: Mobility status ?PT Visit Diagnosis: Other abnormalities of gait and mobility (R26.89);Pain ?Pain - part of body:  (back) ?  ? ? ?Time: 2595-6387 ?PT Time Calculation (min) (ACUTE ONLY): 21 min ? ?Charges:  $Gait Training: 8-22 mins          ?          ? ?09/14/2021 ? ?Jacinto Halim., PT ?Acute Rehabilitation Services ?[phone]  (pager) ?[phone]  (office) ? ? ?Cristian Grant ?09/14/2021, 5:28 PM ? ?

## 2021-09-14 NOTE — Anesthesia Procedure Notes (Signed)
Procedure Name: [place] ?Date/Time: 09/14/2021 10:10 AM ?Performed by: Lowella Dell, CRNA ?Pre-anesthesia Checklist: Patient identified, Emergency Drugs available, Suction available, Patient being monitored and Timeout performed ?Patient Re-evaluated:Patient Re-evaluated prior to induction ?Oxygen Delivery Method: Nasal cannula ?Placement Confirmation: positive ETCO2 ?Dental Injury: Teeth and Oropharynx as per pre-operative assessment  ? ? ? ? ?

## 2021-09-14 NOTE — Interval H&P Note (Signed)
History and Physical Interval Note: ? ?09/14/2021 ?9:57 AM ? ?Cristian Grant  has presented today for surgery, with the diagnosis of BACTEREMIA.  The various methods of treatment have been discussed with the patient and family. After consideration of risks, benefits and other options for treatment, the patient has consented to  Procedure(s): ?TRANSESOPHAGEAL ECHOCARDIOGRAM (TEE) (N/A) as a surgical intervention.  The patient's history has been reviewed, patient examined, no change in status, stable for surgery.  I have reviewed the patient's chart and labs.  Questions were answered to the patient's satisfaction.   ? ? ?Kristeen Miss ? ? ?

## 2021-09-14 NOTE — Anesthesia Postprocedure Evaluation (Signed)
Anesthesia Post Note ? ?Patient: Cristian Grant ? ?Procedure(s) Performed: TRANSESOPHAGEAL ECHOCARDIOGRAM (TEE) ? ?  ? ?Patient location during evaluation: PACU ?Anesthesia Type: MAC ?Level of consciousness: awake and alert ?Pain management: pain level controlled ?Vital Signs Assessment: post-procedure vital signs reviewed and stable ?Respiratory status: spontaneous breathing, nonlabored ventilation and respiratory function stable ?Cardiovascular status: stable and blood pressure returned to baseline ?Anesthetic complications: no ? ? ?No notable events documented. ? ?Last Vitals:  ?Vitals:  ? 09/14/21 1049 09/14/21 1210  ?BP: 122/60 112/69  ?Pulse: 91 86  ?Resp: (!) 23 20  ?Temp: 36.9 ?C 37.1 ?C  ?SpO2: 96% 97%  ?  ?Last Pain:  ?Vitals:  ? 09/14/21 1210  ?TempSrc: Oral  ?PainSc:   ? ? ?  ?  ?  ?  ?  ?  ? ?Audry Pili ? ? ? ? ?

## 2021-09-14 NOTE — Progress Notes (Signed)
?   ? ?[facility] ? ?Date of Admission:  09/04/2021    ?       ?Reason for visit: Follow up on epidural abscess ?  ?Current antibiotics: ?Cefazolin 4/3--present ?  ?Previous antibiotics: ?Ceftriaxone 3/31-- 4/2 ?Vancomycin 3/31 -- 4/3 ? ? ? ?ASSESSMENT:   ? ?36 y.o. male admitted with: ? 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  No hardware placed at time of surgery.  Repeat MRI with contrast 4/2 noted possible epidural phlegmon at T7 and T8 causing mild thecal sac narrowing at T7 in conjunction with a small postoperative fluid collection as well as a redemonstrated epidural lipomatosis from T4-T10 with the exception of the operative levels.  No indications for repeat debridement at this time.  Cultures from the OR grew MSSA.  Blood cultures 4/2 negative.  However, these were obtained after several days of antibiotics which may influence the ability for bacterial growth.  TEE planned for today. ? ?RECOMMENDATIONS:   ? ?Continue cefazolin ?TEE today ?Will follow ? ? ?Principal Problem: ?  Epidural abscess ?Active Problems: ?  Thoracic myelopathy ?  Morbid obesity (HCC) ?  Pure hypercholesterolemia ?  Staph aureus infection ?  Cord compression [facility]) ? ? ? ?MEDICATIONS:   ? ?Scheduled Meds: ? Chlorhexidine Gluconate Cloth  6 each Topical Daily  ? guaiFENesin  1,200 mg Oral BID  ? heparin injection (subcutaneous)  5,000 Units Subcutaneous Q8H  ? polyethylene glycol  17 g Oral Daily  ? rosuvastatin  10 mg Oral Daily  ? senna-docusate  1 tablet Oral QHS  ? sodium chloride flush  10-40 mL Intracatheter Q12H  ? tamsulosin  0.4 mg Oral Daily  ? ?Continuous Infusions: ? sodium chloride Stopped (09/05/21 1630)  ?  ceFAZolin (ANCEF) IV 2 g (09/14/21 3300)  ? ?PRN Meds:.acetaminophen **OR** acetaminophen,  albuterol, menthol-cetylpyridinium **OR** phenol, methocarbamol **OR** [DISCONTINUED] methocarbamol (ROBAXIN) IV, morphine injection, ondansetron **OR** ondansetron (ZOFRAN) IV, oxyCODONE, oxyCODONE, polyvinyl alcohol ? ?SUBJECTIVE:  ? ?24 hour events:  ?No acute events noted over the weekend other than possible PICC line occlusion ?Stable labs ?Blood cultures finalized no growth ?Surgical cultures finalized as MSSA ?Awaiting rehab and TEE ? ? ?No acute complaints ?Reports that his Foley catheter was removed over the weekend and he has been urinating on his own.  He has also been up and walking on his own.  He states he is going for TEE at 1030 this morning. ? ?Review of Systems  ?All other systems reviewed and are negative. ? ?  ?OBJECTIVE:  ? ?Blood pressure 112/72, pulse 80, temperature 98.4 ?F (36.9 ?C), temperature source Oral, resp. rate 20, height 6\' 2"  (1.88 m), weight 136.1 kg, SpO2 96 %. ?Body mass index is 38.52 kg/m?. ? ?Physical Exam ?Constitutional:   ?   General: He is not in acute distress. ?   Appearance: Normal appearance.  ?HENT:  ?   Head: Normocephalic and atraumatic.  ?Eyes:  ?   Extraocular Movements: Extraocular movements intact.  ?   Conjunctiva/sclera: Conjunctivae normal.  ?Pulmonary:  ?   Effort: Pulmonary effort is normal. No respiratory distress.  ?Abdominal:  ?   General: There is no distension.  ?   Palpations: Abdomen is soft.  ?Musculoskeletal:  ?   Cervical back: Normal range of motion and neck supple.  ?   Comments: He  is moving his lower extremities.  ?Skin: ?   General: Skin is warm and dry.  ?Neurological:  ?   General: No focal deficit present.  ?   Mental Status: He is alert and oriented to person, place, and time.  ?Psychiatric:     ?   Mood and Affect: Mood normal.     ?   Behavior: Behavior normal.  ? ? ? ?Lab Results: ?Lab Results  ?Component Value Date  ? WBC 8.3 09/14/2021  ? HGB 12.2 (L) 09/14/2021  ? HCT 37.9 (L) 09/14/2021  ? MCV 93.8 09/14/2021  ? PLT 419 (H)  09/14/2021  ?  ?Lab Results  ?Component Value Date  ? NA 135 09/10/2021  ? K 3.9 09/10/2021  ? CO2 24 09/10/2021  ? GLUCOSE 124 (H) 09/10/2021  ? BUN 8 09/10/2021  ? CREATININE 0.76 09/10/2021  ? CALCIUM 8.6 (L) 09/10/2021  ? GFRNONAA >60 09/10/2021  ? No results found for: ALT, AST, GGT, ALKPHOS, BILITOT ? ?   ?Component Value Date/Time  ? CRP 13.6 (H) 09/04/2021 2004  ? ? ?   ?Component Value Date/Time  ? ESRSEDRATE 44 (H) 09/04/2021 2004  ? ?  ?I have reviewed the micro and lab results in Epic. ? ?Imaging: ?DG CHEST PORT 1 VIEW ? ?Result Date: 09/13/2021 ?CLINICAL DATA:  PICC line placement. EXAM: PORTABLE CHEST 1 VIEW COMPARISON:  September 09, 2021 FINDINGS: A right-sided PICC line is seen. Its distal tip is noted within the distal aspect of the superior vena cava, approximately 1.5 cm proximal to the junction of the superior vena cava and right atrium. Very mild, stable atelectatic changes are seen within the bilateral lung bases. There is no evidence of acute infiltrate, pleural effusion or pneumothorax. The heart size and mediastinal contours are within normal limits. The visualized skeletal structures are unremarkable. IMPRESSION: PICC line positioning, as described above, without evidence of acute or active cardiopulmonary disease. Electronically Signed   By: Aram Candela M.D.   On: 09/13/2021 19:26    ? ?Imaging independently reviewed in Epic.  ? ? ?Kathlynn Grate ?[facility] ?[facility] ?[phone] pager ?09/14/2021, 8:15 AM ? ? ? ?

## 2021-09-14 NOTE — Progress Notes (Signed)
Subjective: ?Patient reports that he is doing well. Intermittent areas of paraesthesia in his lower extremities that continues to improve. No acute events overnight.  ? ?Objective: ?Vital signs in last 24 hours: ?Temp:  [97.7 ?F (36.5 ?C)-99 ?F (37.2 ?C)] 99 ?F (37.2 ?C) (04/09 2337) ?Pulse Rate:  [90-95] 93 (04/09 2337) ?Resp:  [17-20] 20 (04/09 2337) ?BP: (95-127)/(56-80) 95/61 (04/09 2337) ?SpO2:  [92 %-96 %] 94 % (04/09 2337) ? ?Intake/Output from previous day: ?04/09 0701 - 04/10 0700 ?In: -  ?Out: 2200 [Urine:2200] ?Intake/Output this shift: ?No intake/output data recorded. ? ?Physical Exam: Patient is awake, A/O X 4, and conversant. He is in NAD and VSS. Speech is fluent and appropriate. PERLA, EOMI. CNs grossly intact. ?LLE proximally 4-/5, distally dorsiflexion/plantarflexion 4/5 ?RLE proximally 3+/5, dorsiflexion 4-/5, plantarflexion 4-/5 ?Sensory to light touch, currently decreased sensation from ankle and inferiorly ? ?Lab Results: ?No results for input(s): WBC, HGB, HCT, PLT in the last 72 hours. ?BMET ?No results for input(s): NA, K, CL, CO2, GLUCOSE, BUN, CREATININE, CALCIUM in the last 72 hours. ? ?Studies/Results: ?DG CHEST PORT 1 VIEW ? ?Result Date: 09/13/2021 ?CLINICAL DATA:  PICC line placement. EXAM: PORTABLE CHEST 1 VIEW COMPARISON:  September 09, 2021 FINDINGS: A right-sided PICC line is seen. Its distal tip is noted within the distal aspect of the superior vena cava, approximately 1.5 cm proximal to the junction of the superior vena cava and right atrium. Very mild, stable atelectatic changes are seen within the bilateral lung bases. There is no evidence of acute infiltrate, pleural effusion or pneumothorax. The heart size and mediastinal contours are within normal limits. The visualized skeletal structures are unremarkable. IMPRESSION: PICC line positioning, as described above, without evidence of acute or active cardiopulmonary disease. Electronically Signed   By: Aram Candela M.D.   On:  09/13/2021 19:26   ? ?Assessment/Plan: ?36 y.o. 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 OR cultures grew MSSA. ID on board. He is continuing to make improvement in his BLE symptoms. Paresthesia improving, now with small patches of numbness in his lower extremities. His strength is also continuing to improve. PT/OT recommending CIR. Continue working with PT/OT. Continue working on pain control, mobility, and ambulating patient.  ?  ?  ?-Continue working with therapies ?-TEE planned for next week, cards to arrange  ?-Heparin DVT prophylaxis ?-Pain control ?-ABX per ID ?-Continue foley due to neurogenic bladder ? LOS: 10 days  ? ? ? ?Council Mechanic, DNP, NP-C ?09/14/2021, 7:38 AM ? ? ? ? ?

## 2021-09-14 NOTE — Progress Notes (Signed)
?  Echocardiogram ?Echocardiogram Transesophageal has been performed. ? Jarome Matin ?09/14/2021, 10:58 AM ?

## 2021-09-14 NOTE — Anesthesia Preprocedure Evaluation (Addendum)
Anesthesia Evaluation  ?Patient identified by MRN, date of birth, ID band ?Patient awake ? ? ? ?Reviewed: ?Allergy & Precautions, NPO status , Patient's Chart, lab work & pertinent test results ? ?History of Anesthesia Complications ?Negative for: history of anesthetic complications ? ?Airway ?Mallampati: II ? ?TM Distance: >3 FB ?Neck ROM: Full ? ? ? Dental ? ?(+) Dental Advisory Given, Teeth Intact ?  ?Pulmonary ?neg pulmonary ROS,  ?  ?Pulmonary exam normal ? ? ? ? ? ? ? Cardiovascular ?negative cardio ROS ?Normal cardiovascular exam ? ? ?  ?Neuro/Psych ? ?Epidural abscess ? ?negative psych ROS  ? GI/Hepatic ?negative GI ROS, Neg liver ROS,   ?Endo/Other  ? ?Obesity ? ? Renal/GU ?negative Renal ROS  ? ?  ?Musculoskeletal ?negative musculoskeletal ROS ?(+)  ? Abdominal ?  ?Peds ? Hematology ? ?(+) Blood dyscrasia, anemia ,   ?Anesthesia Other Findings ?Bacteremia ? ? Reproductive/Obstetrics ? ?  ? ? ? ? ? ? ? ? ? ? ? ? ? ?  ?  ? ? ? ? ? ? ? ?Anesthesia Physical ?Anesthesia Plan ? ?ASA: 3 ? ?Anesthesia Plan: MAC  ? ?Post-op Pain Management:   ? ?Induction:  ? ?PONV Risk Score and Plan: 1 and Propofol infusion and Treatment may vary due to age or medical condition ? ?Airway Management Planned: Nasal Cannula and Natural Airway ? ?Additional Equipment: None ? ?Intra-op Plan:  ? ?Post-operative Plan:  ? ?Informed Consent: I have reviewed the patients History and Physical, chart, labs and discussed the procedure including the risks, benefits and alternatives for the proposed anesthesia with the patient or authorized representative who has indicated his/her understanding and acceptance.  ? ? ? ? ? ?Plan Discussed with: CRNA and Anesthesiologist ? ?Anesthesia Plan Comments:   ? ? ? ? ? ?Anesthesia Quick Evaluation ? ?

## 2021-09-14 NOTE — H&P (Shared)
? ? ?Physical Medicine and Rehabilitation Admission H&P ? ?  ?Chief Complaint  ?Patient presents with  ? Functional deficits due to cord compression/epidural abscess  ? ? ?HPI: Cristian Grant. Cristian Grant is a 36 year old male in relatively good health who started developing progressive back pain 24 hours after lifting an amplifier work on 09/01/21 with onset of progressive back pain followed by fall and had with progressive BLE weakness. He was admitted via ED on 09/04/21 with chest pain/tightness, numbness from abdomen down and inability to walk. 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 He was taken to OR for bilateral T5-T7 laminectomy with T6/T7 medial facetectomy by Dr. [name] on the same day. Small amount of pus encountered concerning for epidural abscess which was sent for culture and patient started on IV Vanc.  ? ?Post op had improvement in sensory/motor return and ID consulted for input. Cultures without WBC and showed rare Staph aureus. TEE done was negative for endocarditis and Dr. [name] recommends IV cefazolin X 6 weeks with end date of 10/16/21 and recs to check Sed rate/CRP every 14 days.  Foley was placed for neurogenic bladder d/c  04/07 but reports difficulty with voiding as well as issues with constipation. He continues to be limited by LE weakness with sensory deficits affecting mobility and ADLs. CIR recommended due to functional decline.   ? ? ?Review of Systems  ?Constitutional:  Negative for chills and fever.  ?HENT:  Negative for hearing loss.   ?Eyes:  Negative for blurred vision and double vision.  ?Respiratory:  Negative for shortness of breath and stridor.   ?Cardiovascular:  Negative for chest pain and palpitations.  ?Gastrointestinal:  Positive for  constipation (has had BM twice since admission--usual is daily). Negative for heartburn and nausea.  ?Genitourinary:   ?     Has to focus to urinate  ?Musculoskeletal:  Positive for back pain and myalgias.  ?Skin:  Negative for rash.  ?Neurological:  Positive for sensory change and weakness. Negative for dizziness.  ?Psychiatric/Behavioral:  The patient has insomnia (too many sounds around).   ? ? ?Past Medical History:  ?Diagnosis Date  ? Hyperlipidemia   ? ? ?Past Surgical History:  ?Procedure Laterality Date  ? LUMBAR LAMINECTOMY/DECOMPRESSION MICRODISCECTOMY N/A 09/04/2021  ? Procedure: THORACIC FIVE, THORACIC SIX, THORACIC SEVEN LAMINECTOMY FOR DECOMPRESSION AND  RESECTION OF SYNOVIAL CYST;  Surgeon: Dawley, Theodoro Doing, DO;  Location: [facility];  Service: Neurosurgery;  Laterality: N/A;  ? TEE WITHOUT CARDIOVERSION N/A 09/14/2021  ? Procedure: TRANSESOPHAGEAL ECHOCARDIOGRAM (TEE);  Surgeon: Acie Fredrickson Wonda Cheng, MD;  Location: [facility];  Service: Cardiovascular;  Laterality: N/A;  ? ? ?Family History  ?Problem Relation Age of Onset  ? Diabetes Mother   ? Crohn's disease Mother   ? Crohn's disease Sister   ? ? ?Social History: Married. Works as Water quality scientist for [employer] and reports that he has never smoked. He has never used smokeless tobacco. He reports current alcohol use---glass of wine/week.  He reports that he does not currently use drugs. ? ? ?Allergies: No Known Allergies ? ? ?Medications Prior to Admission  ?Medication Sig Dispense Refill  ? hydrOXYzine (ATARAX) 25 MG tablet Take 1 tablet (25 mg total) by mouth every 6 (six) hours. 21 tablet 0  ? methocarbamol (ROBAXIN)  500 MG tablet Take 1 tablet (500 mg total) by mouth 2 (two) times daily. 20 tablet 0  ? naproxen (NAPROSYN) 500 MG tablet Take 1 tablet (500 mg total) by mouth 2 (two) times daily. 30 tablet 0  ? rosuvastatin (CRESTOR) 10 MG tablet Take 10 mg by mouth daily.    ? ? ? ? ?Home: ?Home Living ?Family/patient expects to be discharged to:: Private  residence ?Living Arrangements: Spouse/significant other, Children ?Available Help at Discharge: Family, Available 24 hours/day ?Type of Home: House ?Home Access: Level entry ?Home Layout: Two level, Able to live on main level with bedroom/bathroom (1/2 bath on main level) ?Alternate Level Stairs-Number of Steps: flight ?Bathroom Shower/Tub: Walk-in shower ?Bathroom Toilet: Standard ?Bathroom Accessibility: Yes ?Home Equipment: None ?  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 ? ?Functional Status:  ?Mobility: ?Bed Mobility ?Overal bed mobility: Needs Assistance ?Bed Mobility: Rolling, Sidelying to Sit, Sit to Sidelying ?Rolling: Min guard ?Sidelying to sit: Min assist ?Sit to supine: Mod assist, +2 for physical assistance ?Sit to sidelying: Min assist ?General bed mobility comments: OOB on arrival ?Transfers ?Overall transfer level: Needs assistance ?Equipment used: Rolling walker (2 wheels) ?Transfers: Sit to/from Stand ?Sit to Stand: Min guard ?Bed to/from chair/wheelchair/BSC transfer type:: Via Lift equipment ?Transfer via Lift Equipment: Stedy ?General transfer comment: min guard to stand and with mobility for safety with RW ?Ambulation/Gait ?Ambulation/Gait assistance: Min assist ?Gait Distance (Feet): 230 Feet ?Assistive device: Rolling walker (2 wheels) ?Gait Pattern/deviations: Step-through pattern ?General Gait Details: mild unsteadiness continues with light moderate use of the RW.  generally a weak heel/toe pattern with consistent soft hyperextension and mild knee "wobble" ant/post R LE > .L LE.  With cues, pt brought gait under control quickly when he started to fatigue or lose focus. ?Gait velocity: decreased ?Gait velocity interpretation: 1.31 - 2.62 ft/sec, indicative of limited community ambulator ?Stairs: Yes ?Stairs assistance: Min  assist ?Stair Management: One rail Right, Step to pattern, Sideways ?Number of Stairs: 4 ?General stair comments: step-to pattern, forward ascending, sideways descending, one instance of R knee buckle when descending step ?  ? ?ADL: ?ADL ?Overall ADL's : Needs assistance/impaired ?Eating/Feeding: Independent ?Grooming: Wash/dry hands, Wash/dry face, Oral care, Set up, Sitting ?Grooming Details (indicate cue type and reason): seated at sink performing grooming upon entry ?Upper Body Bathing: Set up, Sitting ?Upper Body Bathing Details (indicate cue type and reason): at sink ?Lower Body Bathing: Total assistance, Bed level ?Upper Body Dressing : Supervision/safety, Sitting ?Upper Body Dressing Details (indicate cue type and reason): in recliner ?Lower Body Dressing: Moderate assistance, Sit to/from stand, With adaptive equipment ?Lower Body Dressing Details (indicate cue type and reason): education on reacher, dressing stick, and sock aide use for LB dressing with patient requiring mod assist ?Toilet Transfer: Min guard, Ambulation, Regular Toilet, BSC/3in1, Rolling walker (2 wheels) ?Toilet Transfer Details (indicate cue type and reason): 3n1 over toilet ?Toileting- Clothing Manipulation and Hygiene: Total assistance, Sit to/from stand, Bed level ?Functional mobility during ADLs: Minimal assistance, Rolling walker (2 wheels) ?General ADL Comments: AE training performed for LB dressing. Education on use of 3n1 over toilet and transfer performed ? ?Cognition: ?Cognition ?Overall Cognitive Status: Within Functional Limits for tasks assessed ?Orientation Level: Oriented X4 ?Cognition ?Arousal/Alertness: Awake/alert ?Behavior During Therapy: [facility] for tasks assessed/performed ?Overall Cognitive Status: Within Functional Limits for tasks assessed ?General Comments: aware of back precautions, ? ? ?Blood pressure  109/74, pulse 85, temperature 98.3 ?F (36.8 ?C), temperature source Oral, resp. rate 20, height 6\' 2"  (1.88 m),  weight (!) 136.1 kg, SpO2 92 %. ?Physical Exam ?Vitals and nursing note reviewed.  ?Constitutional:   ?   Appearance: Normal appearance.  ?Abdominal:  ?   General: There is no distension.  ?   Tenderness: There is no abdo

## 2021-09-14 NOTE — Transfer of Care (Signed)
Immediate Anesthesia Transfer of Care Note ? ?Patient: Cristian Grant ? ?Procedure(s) Performed: TRANSESOPHAGEAL ECHOCARDIOGRAM (TEE) ? ?Patient Location: PACU ? ?Anesthesia Type:MAC ? ?Level of Consciousness: awake and patient cooperative ? ?Airway & Oxygen Therapy: Patient Spontanous Breathing and Patient connected to nasal cannula oxygen ? ?Post-op Assessment: Report given to RN and Post -op Vital signs reviewed and stable ? ?Post vital signs: Reviewed and stable ? ?Last Vitals:  ?Vitals Value Taken Time  ?BP 119/64 09/14/21 1034  ?Temp    ?Pulse 99 09/14/21 1035  ?Resp 14 09/14/21 1035  ?SpO2 94 % 09/14/21 1035  ?Vitals shown include unvalidated device data. ? ?Last Pain:  ?Vitals:  ? 09/14/21 0943  ?TempSrc: Temporal  ?PainSc: 0-No pain  ?   ? ?Patients Stated Pain Goal: 1 (09/13/21 2205) ? ?Complications: No notable events documented. ?

## 2021-09-14 NOTE — H&P (View-Only) (Signed)
Subjective: ?Patient reports that he is doing well. Intermittent areas of paraesthesia in his lower extremities that continues to improve. No acute events overnight.  ? ?Objective: ?Vital signs in last 24 hours: ?Temp:  [97.7 ?F (36.5 ?C)-99 ?F (37.2 ?C)] 99 ?F (37.2 ?C) (04/09 2337) ?Pulse Rate:  [90-95] 93 (04/09 2337) ?Resp:  [17-20] 20 (04/09 2337) ?BP: (95-127)/(56-80) 95/61 (04/09 2337) ?SpO2:  [92 %-96 %] 94 % (04/09 2337) ? ?Intake/Output from previous day: ?04/09 0701 - 04/10 0700 ?In: -  ?Out: 2200 [Urine:2200] ?Intake/Output this shift: ?No intake/output data recorded. ? ?Physical Exam: Patient is awake, A/O X 4, and conversant. He is in NAD and VSS. Speech is fluent and appropriate. PERLA, EOMI. CNs grossly intact. ?LLE proximally 4-/5, distally dorsiflexion/plantarflexion 4/5 ?RLE proximally 3+/5, dorsiflexion 4-/5, plantarflexion 4-/5 ?Sensory to light touch, currently decreased sensation from ankle and inferiorly ? ?Lab Results: ?No results for input(s): WBC, HGB, HCT, PLT in the last 72 hours. ?BMET ?No results for input(s): NA, K, CL, CO2, GLUCOSE, BUN, CREATININE, CALCIUM in the last 72 hours. ? ?Studies/Results: ?DG CHEST PORT 1 VIEW ? ?Result Date: 09/13/2021 ?CLINICAL DATA:  PICC line placement. EXAM: PORTABLE CHEST 1 VIEW COMPARISON:  September 09, 2021 FINDINGS: A right-sided PICC line is seen. Its distal tip is noted within the distal aspect of the superior vena cava, approximately 1.5 cm proximal to the junction of the superior vena cava and right atrium. Very mild, stable atelectatic changes are seen within the bilateral lung bases. There is no evidence of acute infiltrate, pleural effusion or pneumothorax. The heart size and mediastinal contours are within normal limits. The visualized skeletal structures are unremarkable. IMPRESSION: PICC line positioning, as described above, without evidence of acute or active cardiopulmonary disease. Electronically Signed   By: Thaddeus  Houston M.D.   On:  09/13/2021 19:26   ? ?Assessment/Plan: ?36 y.o. 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 OR cultures grew MSSA. ID on board. He is continuing to make improvement in his BLE symptoms. Paresthesia improving, now with small patches of numbness in his lower extremities. His strength is also continuing to improve. PT/OT recommending CIR. Continue working with PT/OT. Continue working on pain control, mobility, and ambulating patient.  ?  ?  ?-Continue working with therapies ?-TEE planned for next week, cards to arrange  ?-Heparin DVT prophylaxis ?-Pain control ?-ABX per ID ?-Continue foley due to neurogenic bladder ? LOS: 10 days  ? ? ? ?Om Lizotte L Tyshon Fanning, DNP, NP-C ?09/14/2021, 7:38 AM ? ? ? ? ?

## 2021-09-14 NOTE — Progress Notes (Addendum)
IP rehab admissions - I have received approval for acute inpatient rehab admission.  Noted TEE today.  I spoke with attending MD and will wait for final TEE results and recommendations until tomorrow.  If medically ready, can plan admit to CIR tomorrow.  Call for questions.  [phone] ?

## 2021-09-14 NOTE — Progress Notes (Signed)
Patient refused CPAP.

## 2021-09-15 ENCOUNTER — Encounter (HOSPITAL_COMMUNITY): Payer: Self-pay | Admitting: Cardiovascular Disease

## 2021-09-15 ENCOUNTER — Encounter (HOSPITAL_COMMUNITY): Payer: Self-pay | Admitting: Neurological Surgery

## 2021-09-15 ENCOUNTER — Inpatient Hospital Stay (HOSPITAL_COMMUNITY)
Admission: RE | Admit: 2021-09-15 | Discharge: 2021-09-20 | DRG: 945 | Disposition: A | Discharge status: Home Health | Payer: BC Managed Care – PPO | Source: Intra-hospital | Attending: Physical Medicine and Rehabilitation | Admitting: Physical Medicine and Rehabilitation

## 2021-09-15 ENCOUNTER — Other Ambulatory Visit: Payer: Self-pay

## 2021-09-15 DIAGNOSIS — Z79899 Other long term (current) drug therapy: Secondary | ICD-10-CM

## 2021-09-15 DIAGNOSIS — Z833 Family history of diabetes mellitus: Secondary | ICD-10-CM | POA: Diagnosis not present

## 2021-09-15 DIAGNOSIS — D62 Acute posthemorrhagic anemia: Secondary | ICD-10-CM | POA: Diagnosis present

## 2021-09-15 DIAGNOSIS — M4804 Spinal stenosis, thoracic region: Secondary | ICD-10-CM | POA: Diagnosis present

## 2021-09-15 DIAGNOSIS — R262 Difficulty in walking, not elsewhere classified: Secondary | ICD-10-CM | POA: Diagnosis present

## 2021-09-15 DIAGNOSIS — G4700 Insomnia, unspecified: Secondary | ICD-10-CM | POA: Diagnosis present

## 2021-09-15 DIAGNOSIS — R7989 Other specified abnormal findings of blood chemistry: Secondary | ICD-10-CM | POA: Diagnosis present

## 2021-09-15 DIAGNOSIS — W19XXXD Unspecified fall, subsequent encounter: Secondary | ICD-10-CM | POA: Diagnosis present

## 2021-09-15 DIAGNOSIS — G062 Extradural and subdural abscess, unspecified: Secondary | ICD-10-CM | POA: Diagnosis present

## 2021-09-15 DIAGNOSIS — N319 Neuromuscular dysfunction of bladder, unspecified: Secondary | ICD-10-CM | POA: Diagnosis present

## 2021-09-15 DIAGNOSIS — K5900 Constipation, unspecified: Secondary | ICD-10-CM | POA: Diagnosis present

## 2021-09-15 DIAGNOSIS — E785 Hyperlipidemia, unspecified: Secondary | ICD-10-CM | POA: Diagnosis present

## 2021-09-15 DIAGNOSIS — E8809 Other disorders of plasma-protein metabolism, not elsewhere classified: Secondary | ICD-10-CM | POA: Diagnosis present

## 2021-09-15 DIAGNOSIS — X500XXD Overexertion from strenuous movement or load, subsequent encounter: Secondary | ICD-10-CM

## 2021-09-15 DIAGNOSIS — S24103D Unspecified injury at T7-T10 level of thoracic spinal cord, subsequent encounter: Principal | ICD-10-CM

## 2021-09-15 DIAGNOSIS — M1288 Other specific arthropathies, not elsewhere classified, other specified site: Secondary | ICD-10-CM | POA: Diagnosis present

## 2021-09-15 DIAGNOSIS — E871 Hypo-osmolality and hyponatremia: Secondary | ICD-10-CM | POA: Diagnosis present

## 2021-09-15 DIAGNOSIS — R7401 Elevation of levels of liver transaminase levels: Secondary | ICD-10-CM | POA: Diagnosis present

## 2021-09-15 DIAGNOSIS — E882 Lipomatosis, not elsewhere classified: Secondary | ICD-10-CM | POA: Diagnosis present

## 2021-09-15 DIAGNOSIS — M4714 Other spondylosis with myelopathy, thoracic region: Secondary | ICD-10-CM | POA: Diagnosis present

## 2021-09-15 DIAGNOSIS — Z6839 Body mass index (BMI) 39.0-39.9, adult: Secondary | ICD-10-CM

## 2021-09-15 DIAGNOSIS — G8918 Other acute postprocedural pain: Secondary | ICD-10-CM | POA: Diagnosis present

## 2021-09-15 DIAGNOSIS — E559 Vitamin D deficiency, unspecified: Secondary | ICD-10-CM | POA: Diagnosis present

## 2021-09-15 DIAGNOSIS — D72829 Elevated white blood cell count, unspecified: Secondary | ICD-10-CM | POA: Diagnosis present

## 2021-09-15 MED ORDER — ACETAMINOPHEN 325 MG PO TABS
650.0000 mg | ORAL_TABLET | Freq: Three times a day (TID) | ORAL | Status: DC
  Administered 2021-09-15 – 2021-09-20 (×14): 650 mg via ORAL
  Filled 2021-09-15 (×15): qty 2

## 2021-09-15 MED ORDER — TAMSULOSIN HCL 0.4 MG PO CAPS: 0.4000 mg | ORAL_CAPSULE | Freq: Every day | ORAL | Status: DC

## 2021-09-15 MED ORDER — POLYVINYL ALCOHOL 1.4 % OP SOLN
2.0000 [drp] | OPHTHALMIC | Status: DC | PRN
  Filled 2021-09-15: qty 15

## 2021-09-15 MED ORDER — ROSUVASTATIN CALCIUM 5 MG PO TABS
10.0000 mg | ORAL_TABLET | Freq: Every day | ORAL | Status: DC
  Administered 2021-09-16 – 2021-09-20 (×5): 10 mg via ORAL
  Filled 2021-09-15 (×5): qty 2

## 2021-09-15 MED ORDER — PROCHLORPERAZINE 25 MG RE SUPP: 12.5000 mg | Freq: Four times a day (QID) | RECTAL | Status: DC | PRN

## 2021-09-15 MED ORDER — METHOCARBAMOL 500 MG PO TABS
500.0000 mg | ORAL_TABLET | Freq: Four times a day (QID) | ORAL | Status: DC | PRN
  Administered 2021-09-15 – 2021-09-19 (×8): 500 mg via ORAL
  Filled 2021-09-15 (×10): qty 1

## 2021-09-15 MED ORDER — MENTHOL 3 MG MT LOZG: 1.0000 | LOZENGE | OROMUCOSAL | Status: DC | PRN

## 2021-09-15 MED ORDER — SODIUM CHLORIDE 0.9 % IV SOLN
INTRAVENOUS | Status: DC | PRN
Start: 2021-09-15 — End: 2021-09-20

## 2021-09-15 MED ORDER — ADULT MULTIVITAMIN W/MINERALS CH
1.0000 | ORAL_TABLET | Freq: Every day | ORAL | Status: DC
  Administered 2021-09-15 – 2021-09-20 (×6): 1 via ORAL
  Filled 2021-09-15 (×6): qty 1

## 2021-09-15 MED ORDER — CHLORHEXIDINE GLUCONATE CLOTH 2 % EX PADS
6.0000 | MEDICATED_PAD | Freq: Every day | CUTANEOUS | Status: DC
  Administered 2021-09-16 – 2021-09-19 (×4): 6 via TOPICAL

## 2021-09-15 MED ORDER — POLYETHYLENE GLYCOL 3350 17 G PO PACK
17.0000 g | PACK | Freq: Every day | ORAL | Status: DC | PRN
Start: 2021-09-15 — End: 2021-09-20

## 2021-09-15 MED ORDER — FLEET ENEMA 7-19 GM/118ML RE ENEM
1.0000 | ENEMA | Freq: Once | RECTAL | Status: DC | PRN
Start: 2021-09-15 — End: 2021-09-20

## 2021-09-15 MED ORDER — GUAIFENESIN-DM 100-10 MG/5ML PO SYRP: 5.0000 mL | ORAL_SOLUTION | Freq: Four times a day (QID) | ORAL | Status: DC | PRN

## 2021-09-15 MED ORDER — TAMSULOSIN HCL 0.4 MG PO CAPS
0.4000 mg | ORAL_CAPSULE | Freq: Every day | ORAL | Status: DC
Start: 2021-09-16 — End: 2021-09-15

## 2021-09-15 MED ORDER — CEFAZOLIN SODIUM-DEXTROSE 2-4 GM/100ML-% IV SOLN
2.0000 g | Freq: Three times a day (TID) | INTRAVENOUS | Status: DC
  Administered 2021-09-15 – 2021-09-20 (×14): 2 g via INTRAVENOUS
  Filled 2021-09-15 (×11): qty 100

## 2021-09-15 MED ORDER — TAMSULOSIN HCL 0.4 MG PO CAPS
0.8000 mg | ORAL_CAPSULE | Freq: Every day | ORAL | Status: DC
  Administered 2021-09-16 – 2021-09-19 (×4): 0.8 mg via ORAL
  Filled 2021-09-15 (×4): qty 2

## 2021-09-15 MED ORDER — TAMSULOSIN HCL 0.4 MG PO CAPS
0.4000 mg | ORAL_CAPSULE | Freq: Once | ORAL | Status: AC
  Administered 2021-09-15: 0.4 mg via ORAL
  Filled 2021-09-15 (×2): qty 1

## 2021-09-15 MED ORDER — PHENOL 1.4 % MT LIQD
1.0000 | OROMUCOSAL | Status: DC | PRN
Start: 2021-09-15 — End: 2021-09-20
  Filled 2021-09-15: qty 177

## 2021-09-15 MED ORDER — MAGNESIUM HYDROXIDE 400 MG/5ML PO SUSP
45.0000 mL | Freq: Once | ORAL | Status: AC
  Administered 2021-09-15: 45 mL via ORAL
  Filled 2021-09-15: qty 60

## 2021-09-15 MED ORDER — PROCHLORPERAZINE EDISYLATE 10 MG/2ML IJ SOLN: 5.0000 mg | Freq: Four times a day (QID) | INTRAMUSCULAR | Status: DC | PRN

## 2021-09-15 MED ORDER — OXYCODONE HCL 5 MG PO TABS
5.0000 mg | ORAL_TABLET | ORAL | Status: DC | PRN
  Administered 2021-09-15: 5 mg via ORAL
  Administered 2021-09-16 – 2021-09-17 (×3): 10 mg via ORAL
  Administered 2021-09-17 (×2): 5 mg via ORAL
  Administered 2021-09-17: 10 mg via ORAL
  Administered 2021-09-18 – 2021-09-20 (×7): 5 mg via ORAL
  Filled 2021-09-15: qty 1
  Filled 2021-09-15 (×5): qty 2
  Filled 2021-09-15: qty 1
  Filled 2021-09-15: qty 2
  Filled 2021-09-15 (×6): qty 1
  Filled 2021-09-15: qty 2
  Filled 2021-09-15: qty 1

## 2021-09-15 MED ORDER — SENNOSIDES-DOCUSATE SODIUM 8.6-50 MG PO TABS
2.0000 | ORAL_TABLET | Freq: Every day | ORAL | Status: DC
  Administered 2021-09-15 – 2021-09-19 (×5): 2 via ORAL
  Filled 2021-09-15 (×5): qty 2

## 2021-09-15 MED ORDER — JUVEN PO PACK
1.0000 | PACK | Freq: Two times a day (BID) | ORAL | Status: DC
  Administered 2021-09-16 – 2021-09-19 (×8): 1 via ORAL
  Filled 2021-09-15 (×6): qty 1

## 2021-09-15 MED ORDER — SODIUM CHLORIDE 0.9% FLUSH: 10.0000 mL | INTRAVENOUS | Status: DC | PRN

## 2021-09-15 MED ORDER — ALBUTEROL SULFATE (2.5 MG/3ML) 0.083% IN NEBU: 2.5000 mg | INHALATION_SOLUTION | RESPIRATORY_TRACT | Status: DC | PRN

## 2021-09-15 MED ORDER — SODIUM CHLORIDE 0.9% FLUSH
10.0000 mL | Freq: Two times a day (BID) | INTRAVENOUS | Status: DC
  Administered 2021-09-15 – 2021-09-20 (×10): 10 mL

## 2021-09-15 MED ORDER — GUAIFENESIN ER 600 MG PO TB12
1200.0000 mg | ORAL_TABLET | Freq: Two times a day (BID) | ORAL | Status: DC
  Administered 2021-09-15 – 2021-09-18 (×6): 1200 mg via ORAL
  Filled 2021-09-15 (×8): qty 2

## 2021-09-15 MED ORDER — PROCHLORPERAZINE MALEATE 5 MG PO TABS: 5.0000 mg | ORAL_TABLET | Freq: Four times a day (QID) | ORAL | Status: DC | PRN

## 2021-09-15 MED ORDER — MELATONIN 5 MG PO TABS: 5.0000 mg | ORAL_TABLET | Freq: Every evening | ORAL | Status: DC | PRN

## 2021-09-15 MED ORDER — MAGNESIUM HYDROXIDE 400 MG/5ML PO SUSP
30.0000 mL | Freq: Every day | ORAL | Status: DC | PRN
  Filled 2021-09-15: qty 30

## 2021-09-15 MED ORDER — BISACODYL 10 MG RE SUPP: 10.0000 mg | Freq: Every day | RECTAL | Status: DC | PRN

## 2021-09-15 MED ORDER — ACETAMINOPHEN 325 MG PO TABS: 325.0000 mg | ORAL_TABLET | ORAL | Status: DC | PRN

## 2021-09-15 MED ORDER — ENOXAPARIN SODIUM 40 MG/0.4ML IJ SOSY
40.0000 mg | PREFILLED_SYRINGE | INTRAMUSCULAR | Status: DC
  Administered 2021-09-15 – 2021-09-19 (×5): 40 mg via SUBCUTANEOUS
  Filled 2021-09-15 (×5): qty 0.4

## 2021-09-15 MED ORDER — ALUM & MAG HYDROXIDE-SIMETH 200-200-20 MG/5ML PO SUSP: 30.0000 mL | ORAL | Status: DC | PRN

## 2021-09-15 MED ORDER — TRAZODONE HCL 50 MG PO TABS: 25.0000 mg | ORAL_TABLET | Freq: Every evening | ORAL | Status: DC | PRN

## 2021-09-15 MED ORDER — DIPHENHYDRAMINE HCL 12.5 MG/5ML PO ELIX: 12.5000 mg | ORAL_SOLUTION | Freq: Four times a day (QID) | ORAL | Status: DC | PRN

## 2021-09-15 NOTE — TOC Transition Note (Signed)
Transition of Care (TOC) - CM/SW Discharge Note ? ? ?Patient Details  ?Name: Cristian Grant ?MRN: [id number] ?Date of Birth: 1986/04/05 ? ?Transition of Care (TOC) CM/SW Contact:  ?Pollie Friar, RN ?Phone Number: ?09/15/2021, 10:39 AM ? ? ?Clinical Narrative:    ?Patient discharging to CIR today. CM signing off.  ? ? ?Final next level of care: [facility] ?Barriers to Discharge: No Barriers Identified ? ? ?Patient Goals and CMS Choice ?  ?  ?Choice offered to / list presented to : Patient ? ?Discharge Placement ?  ?           ?  ?  ?  ?  ? ?Discharge Plan and Services ?  ?Discharge Planning Services: CM Consult ?           ?  ?  ?  ?  ?  ?  ?  ?  ?  ?  ? ?Social Determinants of Health (SDOH) Interventions ?  ? ? ?Readmission Risk Interventions ?   ? View : No data to display.  ?  ?  ?  ? ? ? ? ? ?

## 2021-09-15 NOTE — Progress Notes (Signed)
Physical Therapy Treatment ?Patient Details ?Name: Cristian Grant ?MRN: [id number] ?DOB: [birth year] ?Today's Date: 09/15/2021 ? ? ?History of Present Illness Pt is a 36 y.o. male who presented 09/03/21 with chest and upper back pain after lifting a heavy amplifier 2 days prior. Pt also with lower extremity weakness and numbness. 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 S/p T5-7 laminectomy, resection of epidural lipomatosis, and resection of synovial cyst for decompression 3/31. PMH: HLD ? ?  ?PT Comments  ? ? Ready for short intense AIR stay.  Gait pattern improving, but degrades with musche fatigue or loss of focus.   ?Recommendations for follow up therapy are one component of a multi-disciplinary discharge planning process, led by the attending physician.  Recommendations may be updated based on patient status, additional functional criteria and insurance authorization. ? ?Follow Up Recommendations ? Acute inpatient rehab (3hours/day) ?  ?  ?Assistance Recommended at Discharge Intermittent Supervision/Assistance  ?Patient can return home with the following A little help with walking and/or transfers;A lot of help with bathing/dressing/bathroom;Assist for transportation;Help with stairs or ramp for entrance;Assistance with cooking/housework ?  ?Equipment Recommendations ? Rolling walker (2 wheels);BSC/3in1  ?  ?Recommendations for Other Services   ? ? ?  ?Precautions / Restrictions Precautions ?Precautions: Back;Fall ?Precaution Booklet Issued: No ?Precaution Comments: reviewed 3/3 back precautions ?Restrictions ?Weight Bearing Restrictions: No ?Other Position/Activity Restrictions: no brace needed  ?  ? ?Mobility ? Bed Mobility ?Overal bed mobility: Needs Assistance ?  ?  ?  ?  ?  ?  ?General bed mobility comments: OOB on arrival ?  ? ?Transfers ?Overall transfer level: Needs assistance ?Equipment used: Rolling walker (2 wheels) ?Transfers: Sit  to/from Stand ?Sit to Stand: Min guard ?  ?  ?  ?  ?  ?  ?  ? ?Ambulation/Gait ?Ambulation/Gait assistance: Min assist ?Gait Distance (Feet): 230 Feet ?Assistive device: Rolling walker (2 wheels) ?Gait Pattern/deviations: Step-through pattern ?  ?Gait velocity interpretation: 1.31 - 2.62 ft/sec, indicative of limited community ambulator ?  ?General Gait Details: mild unsteadiness continues with light moderate use of the RW.  generally a weak heel/toe pattern with consistent soft hyperextension and mild knee "wobble" ant/post R LE > .L LE.  With cues, pt brought gait under control quickly when he started to fatigue or lose focus. ? ? ?Stairs ?  ?  ?  ?  ?  ? ? ?Wheelchair Mobility ?  ? ?Modified Rankin (Stroke Patients Only) ?  ? ? ?  ?Balance   ?  ?Sitting balance-Leahy Scale: Good ?  ?  ?  ?Standing balance-Leahy Scale: Fair ?  ?  ?  ?  ?  ?  ?  ?  ?  ?  ?  ?  ?  ? ?  ?Cognition Arousal/Alertness: Awake/alert ?Behavior During Therapy: [facility] for tasks assessed/performed ?Overall Cognitive Status: Within Functional Limits for tasks assessed ?  ?  ?  ?  ?  ?  ?  ?  ?  ?  ?  ?  ?  ?  ?  ?  ?General Comments: aware of back precautions, ?  ?  ? ?  ?Exercises   ? ?  ?General Comments General comments (skin integrity, edema, etc.): Reinforced back education ?  ?  ? ?Pertinent Vitals/Pain Pain Assessment ?Pain Assessment: Faces ?Faces Pain Scale: Hurts a little bit ?Pain Location: back ?Pain Descriptors / Indicators: Discomfort ?Pain Intervention(s): Monitored during session  ? ? ?Home  Living   ?  ?  ?  ?  ?  ?  ?  ?  ?  ?   ?  ?Prior Function    ?  ?  ?   ? ?PT Goals (current goals can now be found in the care plan section) Acute Rehab PT Goals ?PT Goal Formulation: With patient ?Time For Goal Achievement: 09/19/21 ?Potential to Achieve Goals: Good ?Progress towards PT goals: Progressing toward goals ? ?  ?Frequency ? ? ? Min 5X/week ? ? ? ?  ?PT Plan Current plan remains appropriate  ? ? ?Co-evaluation   ?  ?  ?  ?  ? ?   ?AM-PAC PT "6 Clicks" Mobility   ?Outcome Measure ? Help needed turning from your back to your side while in a flat bed without using bedrails?: A Little ?Help needed moving from lying on your back to sitting on the side of a flat bed without using bedrails?: A Little ?Help needed moving to and from a bed to a chair (including a wheelchair)?: A Little ?Help needed standing up from a chair using your arms (e.g., wheelchair or bedside chair)?: A Little ?Help needed to walk in hospital room?: A Little ?Help needed climbing 3-5 steps with a railing? : A Lot ?6 Click Score: 17 ? ?  ?End of Session   ?Activity Tolerance: Patient tolerated treatment well ?Patient left: in chair;with call bell/phone within reach ?Nurse Communication: Mobility status ?PT Visit Diagnosis: Other abnormalities of gait and mobility (R26.89);Pain ?Pain - Right/Left: Right ?Pain - part of body:  (back) ?  ? ? ?Time: [id number] ?PT Time Calculation (min) (ACUTE ONLY): 15 min ? ?Charges:  $Gait Training: 8-22 mins          ?          ? ?09/15/2021 ? ?Ginger Carne., PT ?Acute Rehabilitation Services ?[phone]  (pager) ?[phone]  (office) ? ? ?Tessie Fass Tkeyah Burkman ?09/15/2021, 12:00 PM ? ?

## 2021-09-15 NOTE — Progress Notes (Signed)
Inpatient Rehabilitation Discharge Medication Review by a Pharmacist ? ?A complete drug regimen review was completed for this patient to identify any potential clinically significant medication issues. ? ?High Risk Drug Classes Is patient taking? Indication by Medication  ?Antipsychotic No   ?Anticoagulant No   ?Antibiotic No   ?Opioid No   ?Antiplatelet No   ?Hypoglycemics/insulin No   ?Vasoactive Medication No   ?Chemotherapy No   ?Other Yes Crestor- HLD  ? ? ? ?Type of Medication Issue Identified Description of Issue Recommendation(s)  ?Drug Interaction(s) (clinically significant) ?    ?Duplicate Therapy ?    ?Allergy ?    ?No Medication Administration End Date ?    ?Incorrect Dose ?    ?Additional Drug Therapy Needed ?    ?Significant med changes from prior encounter (inform family/care partners about these prior to discharge).    ?Other ?  ?   ? ? ?Clinically significant medication issues were identified that warrant physician communication and completion of prescribed/recommended actions by midnight of the next day:  No ? ?Time spent performing this drug regimen review (minutes):  30 ? ? ?Rayleigh Gillyard BS, PharmD, BCPS ?Clinical Pharmacist ?09/17/2021 10:35 AM ? ?Contact: [phone] after 3 PM ? ?"Be curious, not judgmental..." -Debbora Dus ?

## 2021-09-15 NOTE — Progress Notes (Signed)
PT on RA. PT refusing CPAP. No resp distress. Will continue to monitor.  ?

## 2021-09-15 NOTE — Progress Notes (Addendum)
PMR Admission Coordinator Pre-Admission Assessment ?  ?Patient: Cristian Grant is an 36 y.o., male ?MRN: [id number] ?DOB: 26-Aug-1985 ?Height: 6\' 2"  (188 cm) ?Weight: (!) 136.1 kg ?  ?Insurance Information ?HMO:     PPO: yes     PCP:      IPA:      80/20:      OTHER:  ?PRIMARY: BCBS      Policy#      Subscriber: patient ?CM Name: : [id number]      Phone#: [phone]     Fax#: [phone] ?Pre-Cert#: [id number]  from 4/10 to 10/05/21 with update due on 10/05/21    Employer:  ?Benefits:  Phone #: [phone]     Name: 4/7 ?Eff. Date: 06/07/21     Deduct: $1250      Out of Pocket Max: $4890      Life Max: none ?CIR: 80%      SNF: 80% 100 days ?Outpatient: $10 to $52 per visit     Co-Pay: visits per medical neccesity ?Home Health: 80%      Co-Pay: visits per medical neccesity ?DME: 80%     Co-Pay: 20% ?Providers: in-network ? ?SECONDARY:       Policy#:      Phone#:  ?  ?Financial Counselor:       Phone#:  ?  ?The ?Data Collection Information Summary? for patients in Inpatient Rehabilitation Facilities with attached ?Privacy Act Statement-Health Care Records? was provided and verbally reviewed with: N/A ?  ?Emergency Contact Information ?Contact Information   ?  ?  Name Relation Home Work Mobile  ?  [name],[name] Spouse [phone]   [phone]  ?  ?   ?  ?Current Medical History  ?Patient Admitting Diagnosis: epidural abscess ?  ?History of Present Illness:  36 year old male with no significant medical hx. Pt presented on 09/03/21 due to pleuritic R upper back and lateral chest pain. Pain gradually worsened over several hours day prior; worse with deep breath and cough. Pt reported lifted something on Tuesday and developed mid/right back pain on Wednesday. CT negative for PE or PNA. Chest x-ray showed no obvious source of pain. Pt sent home. Pt returned to ED with chest tightness, some palpitations, bilateral flank pain, some difficulty breathing, and feeling lightheaded and fatigued. Pt reported  symptoms began ~1 hour after taking muscle relaxer prescribed at previous ED visit on an empty stomach. After diagnostic results reviewed, pt sent home. ?  ? On 09/04/21, pt presented to [facility] ED with c/o tingling in bilateral legs. Tingling goes to waist. MRI showed acute T6-7 compression. Neurosurgery consulted. 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 Pt had T5-7 laminectomy and evacuation of lipomatosis and resection of synovial cyst at T6-7. Concern for epidural abscess. ID consulted with plans for long term treatment with Cefazolin for MSSA epidural abscess.  Plan for TEE pending. Neurogenic bladder.  ?  ?  ?Patient's medical record from [facility] has been reviewed by the rehabilitation admission coordinator and physician. ?  ?Past Medical History  ?    ?Past Medical History:  ?Diagnosis Date  ? Hyperlipidemia    ?  ?  ?Has the patient had major surgery during 100 days prior to admission? Yes ?  ?Family History   ?family history is not on file. ?  ?Current Medications ?  ?Current Facility-Administered Medications:  ?  0.9 %  sodium chloride infusion, 250 mL, Intravenous, Continuous,  Larinda ButteryYap, Vanessa, MD, Last Rate: 0 mL/hr at 09/05/21 1630, New Bag at 09/14/21 1007 ?  acetaminophen (TYLENOL) tablet 650 mg, 650 mg, Oral, Q4H PRN, 650 mg at 09/09/21 0526 **OR** acetaminophen (TYLENOL) suppository 650 mg, 650 mg, Rectal, Q4H PRN, Dawley, Troy C, DO ?  albuterol (PROVENTIL) (2.5 MG/3ML) 0.083% nebulizer solution 2.5 mg, 2.5 mg, Nebulization, Q4H PRN, Coralyn HellingSood, Vineet, MD ?  ceFAZolin (ANCEF) IVPB 2g/100 mL premix, 2 g, Intravenous, Q8H, Kathlynn GrateWallace, Andrew N, DO, Last Rate: 200 mL/hr at 09/14/21 1121, 2 g at 09/14/21 1121 ?  Chlorhexidine Gluconate Cloth 2 % PADS 6 each, 6 each, Topical, Daily, Dawley, Troy C, DO, 6 each at 09/13/21 0826 ?  guaiFENesin (MUCINEX) 12 hr tablet 1,200 mg, 1,200 mg, Oral, BID, Sood, Vineet, MD, 1,200 mg at  09/14/21 1117 ?  heparin injection 5,000 Units, 5,000 Units, Subcutaneous, Q8H, Dawley, Troy C, DO, 5,000 Units at 09/13/21 2200 ?  menthol-cetylpyridinium (CEPACOL) lozenge 3 mg, 1 lozenge, Oral, PRN **OR** phenol (CHLORASEPTIC) mouth spray 1 spray, 1 spray, Mouth/Throat, PRN, Dawley, Troy C, DO ?  methocarbamol (ROBAXIN) tablet 500 mg, 500 mg, Oral, Q6H PRN, 500 mg at 09/13/21 2205 **OR** [DISCONTINUED] methocarbamol (ROBAXIN) 500 mg in dextrose 5 % 50 mL IVPB, 500 mg, Intravenous, Q6H PRN, Dawley, Troy C, DO ?  morphine (PF) 2 MG/ML injection 2 mg, 2 mg, Intravenous, Q3H PRN, Dawley, Troy C, DO ?  ondansetron (ZOFRAN) tablet 4 mg, 4 mg, Oral, Q6H PRN **OR** ondansetron (ZOFRAN) injection 4 mg, 4 mg, Intravenous, Q6H PRN, Dawley, Troy C, DO ?  oxyCODONE (Oxy IR/ROXICODONE) immediate release tablet 10 mg, 10 mg, Oral, Q3H PRN, Dawley, Troy C, DO, 10 mg at 09/13/21 2205 ?  oxyCODONE (Oxy IR/ROXICODONE) immediate release tablet 5 mg, 5 mg, Oral, Q3H PRN, Dawley, Troy C, DO, 5 mg at 09/14/21 1127 ?  polyethylene glycol (MIRALAX / GLYCOLAX) packet 17 g, 17 g, Oral, Daily, Coralyn HellingSood, Vineet, MD, 17 g at 09/14/21 1118 ?  polyvinyl alcohol (LIQUIFILM TEARS) 1.4 % ophthalmic solution 2 drop, 2 drop, Right Eye, PRN, Dawley, Troy C, DO ?  rosuvastatin (CRESTOR) tablet 10 mg, 10 mg, Oral, Daily, Dawley, Troy C, DO, 10 mg at 09/14/21 1118 ?  senna-docusate (Senokot-S) tablet 1 tablet, 1 tablet, Oral, QHS, Sood, Vineet, MD, 1 tablet at 09/13/21 2200 ?  sodium chloride flush (NS) 0.9 % injection 10-40 mL, 10-40 mL, Intracatheter, Q12H, Dawley, Troy C, DO, 10 mL at 09/13/21 2205 ?  tamsulosin (FLOMAX) capsule 0.4 mg, 0.4 mg, Oral, Daily, Tressie StalkerJenkins, Jeffrey, MD, 0.4 mg at 09/14/21 1118 ?  ?Patients Current Diet:  ?Diet Order   ?  ?         ?    Diet regular Room service appropriate? Yes; Fluid consistency: Thin  Diet effective now       ?  ?  ?   ?  ?  ?   ?  ?Precautions / Restrictions ?Precautions ?Precautions: Fall, Back ?Precaution  Booklet Issued: No ?Precaution Comments: reviewed 3/3 back precautions ?Restrictions ?Weight Bearing Restrictions: No ?Other Position/Activity Restrictions: no brace needed  ?  ?Has the patient had 2 or more falls or a fall with injury in the past year? No ?  ?Prior Activity Level ?Community (5-7x/wk): works, drives ?  ?Prior Functional Level ?Self Care: Did the patient need help bathing, dressing, using the toilet or eating? Independent ?  ?Indoor Mobility: Did the patient need assistance with walking from room to room (with or without device)? Independent ?  ?  Stairs: Did the patient need assistance with internal or external stairs (with or without device)? Independent ?  ?Functional Cognition: Did the patient need help planning regular tasks such as shopping or remembering to take medications? Independent ?  ?Patient Information ?Are you of Hispanic, Latino/a,or Spanish origin?: A. No, not of Hispanic, Latino/a, or Spanish origin ?What is your race?: B. Black or African American ?Do you need or want an interpreter to communicate with a doctor or health care staff?: 0. No ?  ?Patient's Response To:  ?Health Literacy and Transportation ?Is the patient able to respond to health literacy and transportation needs?: Yes ?Health Literacy - How often do you need to have someone help you when you read instructions, pamphlets, or other written material from your doctor or pharmacy?: Never ?In the past 12 months, has lack of transportation kept you from medical appointments or from getting medications?: No ?In the past 12 months, has lack of transportation kept you from meetings, work, or from getting things needed for daily living?: No ?  ?Home Assistive Devices / Equipment ?Home Assistive Devices/Equipment: None ?Home Equipment: None ?  ?Prior Device Use: Indicate devices/aids used by the patient prior to current illness, exacerbation or injury? None of the above ?  ?Current Functional Level ?Cognition ?  Overall Cognitive  Status: Within Functional Limits for tasks assessed ?Orientation Level: Oriented X4 ?General Comments: impaired recall of back precautions initially, is able to recall 3/3 at end of session ?   ?Extremity

## 2021-09-15 NOTE — Progress Notes (Signed)
Order to discharge pt to rehab - report called to [phone] RN.  All questions answered.   Will continue to monitor pt until transfer for 4W.   ?

## 2021-09-15 NOTE — Progress Notes (Signed)
Occupational Therapy Treatment ?Patient Details ?Name: Cristian Grant ?MRN: [id number] ?DOB: Mar 29, 1986 ?Today's Date: 09/15/2021 ? ? ?History of present illness Pt is a 36 y.o. male who presented 09/03/21 with chest and upper back pain after lifting a heavy amplifier 2 days prior. Pt also with lower extremity weakness and numbness. 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 S/p T5-7 laminectomy, resection of epidural lipomatosis, and resection of synovial cyst for decompression 3/31. PMH: HLD ?  ?OT comments ? Patient received sitting at sink performing grooming and completing UB bathing. Patient instructed on use of reacher, dressing stick, and sock aide for LB dressing. Demonstration provided and patient was able to return demonstration with mod assist to complete LB dressing. Patient instructed on use of 3n1 over toilet and performed transfer with min guard assists and verbal cues. Patient is expected to discharge to AIR and would benefit from further OT services to increase independence with self care.   ? ?Recommendations for follow up therapy are one component of a multi-disciplinary discharge planning process, led by the attending physician.  Recommendations may be updated based on patient status, additional functional criteria and insurance authorization. ?   ?Follow Up Recommendations ? Acute inpatient rehab (3hours/day)  ?  ?Assistance Recommended at Discharge Frequent or constant Supervision/Assistance  ?Patient can return home with the following ? Assistance with cooking/housework;Help with stairs or ramp for entrance;Assist for transportation;A little help with walking and/or transfers;A lot of help with bathing/dressing/bathroom ?  ?Equipment Recommendations ? Other (comment) (AE)  ?  ?Recommendations for Other Services   ? ?  ?Precautions / Restrictions Precautions ?Precautions: Back;Fall ?Precaution Booklet Issued: No ?Precaution  Comments: reviewed 3/3 back precautions ?Restrictions ?Weight Bearing Restrictions: No ?Other Position/Activity Restrictions: no brace needed  ? ? ?  ? ?Mobility Bed Mobility ?Overal bed mobility: Needs Assistance ?  ?  ?  ?  ?  ?  ?General bed mobility comments: seated in chair at sink upon arrival and left in recliner at end of session ?  ? ?Transfers ?Overall transfer level: Needs assistance ?Equipment used: Rolling walker (2 wheels) ?Transfers: Sit to/from Stand ?Sit to Stand: Min guard ?  ?  ?  ?  ?  ?General transfer comment: min guard to stand and with mobility for safety with RW ?  ?  ?Balance Overall balance assessment: Needs assistance ?Sitting-balance support: No upper extremity supported, Feet supported ?Sitting balance-Leahy Scale: Good ?  ?  ?Standing balance support: During functional activity, Bilateral upper extremity supported, Reliant on assistive device for balance, Single extremity supported ?Standing balance-Leahy Scale: Poor ?Standing balance comment: able to use one extremity to assist with pulling up pants ?  ?  ?  ?  ?  ?  ?  ?  ?  ?  ?  ?   ? ?ADL either performed or assessed with clinical judgement  ? ?ADL Overall ADL's : Needs assistance/impaired ?  ?  ?Grooming: Wash/dry hands;Wash/dry face;Oral care;Set up;Sitting ?Grooming Details (indicate cue type and reason): seated at sink performing grooming upon entry ?Upper Body Bathing: Set up;Sitting ?Upper Body Bathing Details (indicate cue type and reason): at sink ?  ?  ?Upper Body Dressing : Supervision/safety;Sitting ?Upper Body Dressing Details (indicate cue type and reason): in recliner ?Lower Body Dressing: Moderate assistance;Sit to/from stand;With adaptive equipment ?Lower Body Dressing Details (indicate cue type and reason): education on reacher, dressing stick, and sock aide use for LB dressing with patient requiring mod assist ?  Toilet Transfer: Min guard;Ambulation;Regular Toilet;BSC/3in1;Rolling walker (2 wheels) ?Toilet  Transfer Details (indicate cue type and reason): 3n1 over toilet ?  ?  ?  ?  ?  ?General ADL Comments: AE training performed for LB dressing. Education on use of 3n1 over toilet and transfer performed ?  ? ?Extremity/Trunk Assessment Upper Extremity Assessment ?RUE Deficits / Details: scapular pain with ROM and weighbearing ?  ?  ?  ?  ?  ? ?Vision   ?  ?  ?Perception   ?  ?Praxis   ?  ? ?Cognition Arousal/Alertness: Awake/alert ?Behavior During Therapy: [facility] for tasks assessed/performed ?Overall Cognitive Status: Within Functional Limits for tasks assessed ?  ?  ?  ?  ?  ?  ?  ?  ?  ?  ?  ?  ?  ?  ?  ?  ?General Comments: aware of back precautions, demonstrated good understanding of AE ?  ?  ?   ?Exercises   ? ?  ?Shoulder Instructions   ? ? ?  ?General Comments    ? ? ?Pertinent Vitals/ Pain       Pain Assessment ?Pain Assessment: Faces ?Faces Pain Scale: Hurts little more ?Pain Location: back ?Pain Descriptors / Indicators: Discomfort ?Pain Intervention(s): Limited activity within patient's tolerance, Monitored during session, Premedicated before session, Repositioned ? ?Home Living   ?  ?  ?  ?  ?  ?  ?  ?  ?  ?  ?  ?  ?  ?  ?  ?  ?  ?  ? ?  ?Prior Functioning/Environment    ?  ?  ?  ?   ? ?Frequency ? Min 2X/week  ? ? ? ? ?  ?Progress Toward Goals ? ?OT Goals(current goals can now be found in the care plan section) ? Progress towards OT goals: Progressing toward goals ? ?Acute Rehab OT Goals ?Patient Stated Goal: get stronger ?OT Goal Formulation: With patient ?Time For Goal Achievement: 09/19/21 ?Potential to Achieve Goals: Good ?ADL Goals ?Pt Will Perform Grooming: with supervision;sitting ?Pt Will Perform Upper Body Bathing: with set-up;sitting ?Pt Will Perform Lower Body Bathing: with mod assist;with adaptive equipment;sit to/from stand ?Pt Will Perform Upper Body Dressing: with supervision;sitting ?Pt Will Perform Lower Body Dressing: with mod assist;sit to/from stand;with adaptive equipment ?Pt Will  Transfer to Toilet: with mod assist;stand pivot transfer;bedside commode ?Pt Will Perform Toileting - Clothing Manipulation and hygiene: with mod assist;sit to/from stand  ?Plan Discharge plan remains appropriate   ? ?Co-evaluation ? ? ?   ?  ?  ?  ?  ? ?  ?AM-PAC OT "6 Clicks" Daily Activity     ?Outcome Measure ? ? Help from another person eating meals?: None ?Help from another person taking care of personal grooming?: A Little ?Help from another person toileting, which includes using toliet, bedpan, or urinal?: A Lot ?Help from another person bathing (including washing, rinsing, drying)?: A Lot ?Help from another person to put on and taking off regular upper body clothing?: A Little ?Help from another person to put on and taking off regular lower body clothing?: A Lot ?6 Click Score: 16 ? ?  ?End of Session Equipment Utilized During Treatment: Rolling walker (2 wheels) ? ?OT Visit Diagnosis: Unsteadiness on feet (R26.81);Pain;Muscle weakness (generalized) (M62.81) ?  ?Activity Tolerance Patient tolerated treatment well ?  ?Patient Left in chair;with call bell/phone within reach ?  ?Nurse Communication Mobility status ?  ? ?   ? ?Time: [id number] ?  OT Time Calculation (min): 32 min ? ?Charges: OT General Charges ?$OT Visit: 1 Visit ?OT Treatments ?$Self Care/Home Management : 23-37 mins ? ?Lodema Hong, OTA ?Acute Rehabilitation Services  ?Pager [phone] ?Office [phone] ? ? ?[place] ?09/15/2021, 11:06 AM ?

## 2021-09-15 NOTE — Progress Notes (Signed)
PHARMACY CONSULT NOTE FOR: ? ?OUTPATIENT  PARENTERAL ANTIBIOTIC THERAPY (OPAT) ? ?Indication: MSSA epidural abscess ?Regimen: Cefazolin 2g IV every 8 hours ?End date: 10/16/21 ? ?IV antibiotic discharge orders are pended. ?To discharging provider:  please sign these orders via discharge navigator,  ?Select New Orders & click on the button choice - Manage This Unsigned Work.  ? ?Thank you for allowing pharmacy to be a part of this patient?s care. ? ?Georgina Pillion, PharmD, BCPS ?Infectious Diseases Clinical Pharmacist ?09/15/2021 2:48 PM  ? ?**Pharmacist phone directory can now be found on [email or web address] ([email or web address]).  Listed under [facility].  ?

## 2021-09-15 NOTE — Progress Notes (Signed)
?   ? ?[facility] ? ?Date of Admission:  09/04/2021    ?       ?Reason for visit: Follow up on epidural abscess ?  ?Current antibiotics: ?Cefazolin 4/3--present ?  ?Previous antibiotics: ?Ceftriaxone 3/31-- 4/2 ?Vancomycin 3/31 -- 4/3 ? ? ?ASSESSMENT:   ? ?36 y.o. male admitted with: ? 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  No hardware placed at time of surgery.  Repeat MRI with contrast 4/2 noted possible epidural phlegmon at T7 and T8 causing mild thecal sac narrowing at T7 in conjunction with a small postoperative fluid collection as well as a redemonstrated epidural lipomatosis from T4-T10 with the exception of the operative levels.  No indications for repeat debridement at this time.  Cultures from the OR grew MSSA.  Blood cultures 4/2 negative.  However, these were obtained after several days of antibiotics which may influence the ability for bacterial growth.  TEE was negative for endocarditis on 09/14/21. ? ?RECOMMENDATIONS:   ? ?Continue cefazolin ?Will get updated ESR and CRP in the morning Please check every 2 weeks in rehab ?Discharge to CIR when bed available ?Will sign off, please call as needed ?See OPAT note below ? ? ?Diagnosis: ?Epidural abscess ? ?Culture Result: MSSA ? ?No Known Allergies ? ?OPAT Orders ?Discharge antibiotics to be given via PICC line ?Discharge antibiotics: ?Per pharmacy protocol cefazolin ? ?Duration: ?6 weeks ? ?End Date: ?10/16/21 ? ?[facility] Care Per Protocol: ? ?Home health RN for IV administration and teaching; PICC line care and labs.   ? ?Labs weekly while on IV antibiotics: ?_xx_ CBC with differential ?_xx_ BMP ?__ CMP ?_xx_ CRP ?_xx_ ESR ?__ Vancomycin trough ?__ CK ? ?__ Please pull PIC at completion of IV antibiotics ?__ Please leave PIC in place  until doctor has seen patient or been notified ? ?Fax weekly labs to [phone] ? ?Clinic Follow Up Appt: ?10/07/21 at 3:30 with Dr [name] ? ? ? ?Principal Problem: ?  Epidural abscess ?Active Problems: ?  Thoracic myelopathy ?  Morbid obesity ([place]) ?  Pure hypercholesterolemia ?  Staph aureus infection ?  Cord compression [facility]) ? ? ? ?MEDICATIONS:   ? ?Scheduled Meds: ? Chlorhexidine Gluconate Cloth  6 each Topical Daily  ? guaiFENesin  1,200 mg Oral BID  ? heparin injection (subcutaneous)  5,000 Units Subcutaneous Q8H  ? polyethylene glycol  17 g Oral Daily  ? rosuvastatin  10 mg Oral Daily  ? senna-docusate  1 tablet Oral QHS  ? sodium chloride flush  10-40 mL Intracatheter Q12H  ? tamsulosin  0.4 mg Oral Daily  ? ?Continuous Infusions: ? sodium chloride 0 mL/hr at 09/05/21 1630  ?  ceFAZolin (ANCEF) IV 2 g (09/15/21 0240)  ? ?PRN Meds:.acetaminophen **OR** acetaminophen, albuterol, menthol-cetylpyridinium **OR** phenol, methocarbamol **OR** [DISCONTINUED] methocarbamol (ROBAXIN) IV, morphine injection, ondansetron **OR** ondansetron (ZOFRAN) IV, oxyCODONE, oxyCODONE, polyvinyl alcohol ? ?SUBJECTIVE:  ? ?24 hour events:  ?No acute events appear to be of note overnight TEE was completed yesterday without vegetations ?Afebrile ?Plan for acute rehab placement remains ?No new labs this morning ?No new micro ?No other new imaging ? ?No new complaints.  Afebrile.  Reports that he was working with physical therapy earlier this morning and also took a bath. ? ?Review of Systems  ?All other systems reviewed and are negative. ? ?  ?  OBJECTIVE:  ? ?Blood pressure 103/65, pulse 93, temperature 98.5 ?F (36.9 ?C), temperature source Oral, resp. rate 16, height 6' 2"  (1.88 m), weight (!) 136.1 kg, SpO2 92 %. ?Body mass index is 38.52 kg/m?. ? ?Physical Exam ?Constitutional:   ?   General: He is not in acute distress. ?   Appearance: Normal appearance.  ?HENT:  ?   Head: Normocephalic and atraumatic.  ?Eyes:  ?   Extraocular  Movements: Extraocular movements intact.  ?   Conjunctiva/sclera: Conjunctivae normal.  ?Pulmonary:  ?   Effort: Pulmonary effort is normal. No respiratory distress.  ?Abdominal:  ?   General: There is no distension.  ?   Palpations: Abdomen is soft.  ?Musculoskeletal:     ?   General: Normal range of motion.  ?   Cervical back: Normal range of motion and neck supple.  ?   Comments: Sitting up in the chair ?PICC line in place  ?Neurological:  ?   General: No focal deficit present.  ?   Mental Status: He is alert and oriented to person, place, and time.  ?Psychiatric:     ?   Mood and Affect: Mood normal.     ?   Behavior: Behavior normal.  ? ? ? ?Lab Results: ?Lab Results  ?Component Value Date  ? WBC 8.3 09/14/2021  ? HGB 12.2 (L) 09/14/2021  ? HCT 37.9 (L) 09/14/2021  ? MCV 93.8 09/14/2021  ? PLT 419 (H) 09/14/2021  ?  ?Lab Results  ?Component Value Date  ? NA 135 09/14/2021  ? K 4.2 09/14/2021  ? CO2 24 09/14/2021  ? GLUCOSE 99 09/14/2021  ? BUN 12 09/14/2021  ? CREATININE 0.88 09/14/2021  ? CALCIUM 9.2 09/14/2021  ? GFRNONAA >60 09/14/2021  ? No results found for: ALT, AST, GGT, ALKPHOS, BILITOT ? ?   ?Component Value Date/Time  ? CRP 13.6 (H) 09/04/2021 2004  ? ? ?   ?Component Value Date/Time  ? ESRSEDRATE 44 (H) 09/04/2021 2004  ? ?  ?I have reviewed the micro and lab results in Epic. ? ?Imaging: ?DG CHEST PORT 1 VIEW ? ?Result Date: 09/13/2021 ?CLINICAL DATA:  PICC line placement. EXAM: PORTABLE CHEST 1 VIEW COMPARISON:  September 09, 2021 FINDINGS: A right-sided PICC line is seen. Its distal tip is noted within the distal aspect of the superior vena cava, approximately 1.5 cm proximal to the junction of the superior vena cava and right atrium. Very mild, stable atelectatic changes are seen within the bilateral lung bases. There is no evidence of acute infiltrate, pleural effusion or pneumothorax. The heart size and mediastinal contours are within normal limits. The visualized skeletal structures are unremarkable.  IMPRESSION: PICC line positioning, as described above, without evidence of acute or active cardiopulmonary disease. Electronically Signed   By: Virgina Norfolk M.D.   On: 09/13/2021 19:26  ? ?ECHO TEE ? ?Result Date: 09/14/2021 ?   TRANSESOPHOGEAL ECHO REPORT   Patient Name:   Cristian Grant Date of Exam: 09/14/2021 Medical Rec #:  [id number]               Height:       74.0 in Accession #:    [id number]              Weight:       300.0 lb Date of Birth:  September 17, 1985               BSA:  2.582 m? Patient Age:    58 years                BP:           119/68 mmHg Patient Gender: M                       HR:           95 bpm. Exam Location:  Inpatient Procedure: Transesophageal Echo, Cardiac Doppler and Color Doppler Indications:     Epidural abscess [[id number]]  History:         Patient has prior history of Echocardiogram examinations, most                  recent 09/07/2021. Risk Factors:Dyslipidemia. Staph aureus                  infection.  Sonographer:     Darlina Sicilian RDCS Referring Phys:  [id number] HAO MENG Diagnosing Phys: Mertie Moores MD PROCEDURE: After discussion of the risks and benefits of a TEE, an informed consent was obtained from the patient. TEE procedure time was 8 minutes. The transesophogeal probe was passed without difficulty through the esophogus of the patient. Imaged were  obtained with the patient in a left lateral decubitus position. Sedation performed by different physician. The patient was monitored while under deep sedation. Anesthestetic sedation was provided intravenously by Anesthesiology: 232.53m of Propofol, 1084mof Lidocaine. Image quality was good. The patient's vital signs; including heart rate, blood pressure, and oxygen saturation; remained stable throughout the procedure. The patient developed no complications during the procedure. IMPRESSIONS  1. Left ventricular ejection fraction, by estimation, is 65 to 70%. The left ventricle has normal function.  2. Right  ventricular systolic function is normal. The right ventricular size is normal.  3. No left atrial/left atrial appendage thrombus was detected.  4. There is mild prolapse of a scallop of the anterior leaflet of t

## 2021-09-15 NOTE — Discharge Summary (Signed)
Physician Discharge Summary  ?Patient ID: ?Cristian Grant ?MRN: [id number] ?DOB/AGE: 37-Feb-1987 37 y.o. ? ?Admit date: 09/04/2021 ?Discharge date: 09/15/2021 ? 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 ?Principal Problem: ?  Epidural abscess ?Active Problems: ?  Thoracic myelopathy ?  Morbid obesity ([facility]) ?  Pure hypercholesterolemia ?  Staph aureus infection ?  Cord compression [facility]) ? ? ?Discharged Condition: good ? ?Hospital Course: The patient was admitted on 09/04/2021 and taken to the operating room where the patient underwent T5-7 laminectomy. The patient tolerated the procedure well and was taken to the recovery room and then to the floor in stable condition. The hospital course was routine. There were no complications. The wound remained clean dry and intact. Pt had appropriate back soreness. No complaints of leg pain or new N/T/W. The patient remained afebrile with stable vital signs, and tolerated a regular diet. The patient continued to increase activities, and pain was well controlled with oral pain medications. He is now appropriate for discharge to CIR.  ? ? ?Consults: cardiology and pulmonary/intensive care ? ?Significant Diagnostic Studies: radiology: CXR: atelectasis bilaterally, X-Ray: intraoperative, MRI: lumbar, thoracic, and CT scan: CTA chest  and cardiac graphics:  TEE ? ?Treatments: surgery:  ?Bilateral T5, T6, T7 laminectomy for decompression of neural elements ?Right T6-7 medial facetectomy ?Intraoperative use of fluoroscopy ?Intraoperative use of microscope for microdissection ?  ? ?Discharge Exam: ?Blood pressure 103/65, pulse 93, temperature 98.5 ?F (36.9 ?C), temperature source Oral, resp. rate 16, height 6\' 2"  (1.88 m), weight (!) 136.1 kg, SpO2 92  %. ? ?Physical Exam: Patient is awake, A/O X 4, and conversant. He is in NAD and VSS. Speech is fluent and appropriate. PERLA, EOMI. CNs grossly intact. RUE PICC line. Incision is well approximated with no drainage, erythema, or edema. ?LLE proximally 4-/5, distally dorsiflexion/plantarflexion 4/5 ?RLE proximally 3+/5, dorsiflexion 4-/5, plantarflexion 4-/5 ?Sensory to light touch, currently decreased sensation from ankle and inferiorly ? ? ? ? ? ? ?Disposition: Discharge disposition: [facility] Not Defined ? ? ? ? ? ? ?Discharge Instructions   ? ? Incentive spirometry RT   Complete by: As directed ?  ? ?  ? ?Allergies as of 09/15/2021   ?No Known Allergies ?  ? ?  ?Medication List  ?  ? ?TAKE these medications   ? ?hydrOXYzine 25 MG tablet ?Commonly known as: ATARAX ?Take 1 tablet (25 mg total) by mouth every 6 (six) hours. ?  ?methocarbamol 500 MG tablet ?Commonly known as: ROBAXIN ?Take 1 tablet (500 mg total) by mouth 2 (two) times daily. ?  ?naproxen 500 MG tablet ?Commonly known as: NAPROSYN ?Take 1 tablet (500 mg total) by mouth 2 (two) times daily. ?  ?rosuvastatin 10 MG tablet ?Commonly known as: CRESTOR ?Take 10 mg by mouth daily. ?  ? ?  ? ? ? ?Signed: ?11/15/2021, DNP, NP-C ?09/15/2021, 10:43 AM ? ? ?

## 2021-09-15 NOTE — Progress Notes (Signed)
Inpatient Rehab Admissions Coordinator:   ? ?I have insurance approval and a bed available for pt to admit to CIR today. Dr. Jake Samples in agreement.  Will let pt/family and TOC team know.  ? ?Estill Dooms, PT, DPT ?Admissions Coordinator ?[phone] ?09/15/21  ?11:10 AM  ? ?

## 2021-09-15 NOTE — Progress Notes (Signed)
Rehab room changed to 4M09 - will continue to monitor pt until transfer.  ?

## 2021-09-15 NOTE — Progress Notes (Signed)
Inpatient Rehabilitation Admission Medication Review by a Pharmacist ? ?A complete drug regimen review was completed for this patient to identify any potential clinically significant medication issues. ? ?High Risk Drug Classes Is patient taking? Indication by Medication  ?Antipsychotic Yes Compazine- N/V  ?Anticoagulant Yes Lovenox- VTE prophylaxis  ?Antibiotic Yes Cefazolin- epidural abscess  ?Opioid Yes OxyIR- acute pain  ?Antiplatelet No   ?Hypoglycemics/insulin No   ?Vasoactive Medication Yes Flomax- BPH  ?Chemotherapy No   ?Other Yes Crestor- HLD ?Trazodone- sleep  ? ? ? ?Type of Medication Issue Identified Description of Issue Recommendation(s)  ?Drug Interaction(s) (clinically significant) ?    ?Duplicate Therapy ?    ?Allergy ?    ?No Medication Administration End Date ? Cefazolin As per ID note, end date is 10/16/2021 after last dose. I updated the end date on the order to reflect this  ?Incorrect Dose ?    ?Additional Drug Therapy Needed ?    ?Significant med changes from prior encounter (inform family/care partners about these prior to discharge).    ?Other ?  ?   ? ? ?Clinically significant medication issues were identified that warrant physician communication and completion of prescribed/recommended actions by midnight of the next day:  No ? ?Time spent performing this drug regimen review (minutes):  30 ? ? ?Raevin Wierenga BS, PharmD, BCPS ?Clinical Pharmacist ?09/15/2021 2:30 PM ? ?Contact: [phone] after 3 PM ? ?"Be curious, not judgmental..." -Debbora Dus ?

## 2021-09-15 NOTE — H&P (Addendum)
? ? ?Physical Medicine and Rehabilitation Admission H&P ? ?  ?Chief Complaint  ?Patient presents with  ? Functional deficits due to cord compression/epidural abscess  ? ? ?HPI: Cristian Grant. Gardy is a 36 year old male in relatively good health who started developing progressive back pain 24 hours after lifting an amplifier work on 09/01/21 with onset of progressive back pain followed by fall and had with progressive BLE weakness. He was admitted via ED on 09/04/21 with chest pain/tightness, numbness from abdomen down and inability to walk. 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 He was taken to OR for bilateral T5-T7 laminectomy with T6/T7 medial facetectomy by Dr. Jake Samples on the same day. Small amount of pus encountered concerning for epidural abscess which was sent for culture and patient started on IV Vanc.  ? ?Post op had improvement in sensory/motor return and ID consulted for input. Cultures without WBC and showed rare Staph aureus. TEE done was negative for endocarditis and Dr. Earlene Plater recommends IV cefazolin X 6 weeks with end date of 10/16/21 and recs to check Sed rate/CRP every 14 days.  Foley was placed for neurogenic bladder d/c  04/07 but reports difficulty with voiding as well as issues with constipation. He continues to be limited by LE weakness with sensory deficits affecting mobility and ADLs. CIR recommended due to functional decline.  He asks about exercises for weight loss.  ? ? ?Review of Systems  ?Constitutional:  Negative for chills and fever.  ?HENT:  Negative for hearing loss.   ?Eyes:  Negative for blurred vision and double vision.  ?Respiratory:  Negative for shortness of breath and stridor.   ?Cardiovascular:  Negative for chest pain and palpitations.   ?Gastrointestinal:  Positive for constipation (has had BM twice since admission--usual is daily). Negative for heartburn and nausea.  ?Genitourinary:   ?     Has to focus to urinate  ?Musculoskeletal:  Positive for back pain and myalgias.  ?Skin:  Negative for rash.  ?Neurological:  Positive for sensory change and weakness. Negative for dizziness.  ?Psychiatric/Behavioral:  The patient has insomnia (too many sounds around).   ? ? ?Past Medical History:  ?Diagnosis Date  ? Hyperlipidemia   ? ? ?Past Surgical History:  ?Procedure Laterality Date  ? LUMBAR LAMINECTOMY/DECOMPRESSION MICRODISCECTOMY N/A 09/04/2021  ? Procedure: THORACIC FIVE, THORACIC SIX, THORACIC SEVEN LAMINECTOMY FOR DECOMPRESSION AND  RESECTION OF SYNOVIAL CYST;  Surgeon: Dawley, Alan Mulder, DO;  Location: [facility] OR;  Service: Neurosurgery;  Laterality: N/A;  ? TEE WITHOUT CARDIOVERSION N/A 09/14/2021  ? Procedure: TRANSESOPHAGEAL ECHOCARDIOGRAM (TEE);  Surgeon: Elease Hashimoto Deloris Ping, MD;  Location: [facility] ENDOSCOPY;  Service: Cardiovascular;  Laterality: N/A;  ? ? ?Family History  ?Problem Relation Age of Onset  ? Diabetes Mother   ? Crohn's disease Mother   ? Crohn's disease Sister   ? ? ?Social History: Married. Works as Financial planner for [employer] and reports that he has never smoked. He has never used smokeless tobacco. He reports current alcohol use---glass of wine/week.  He reports that he does not currently use drugs. ? ? ?Allergies: No Known Allergies ? ? ?Medications Prior to Admission  ?Medication Sig Dispense Refill  ? hydrOXYzine (ATARAX) 25 MG tablet Take 1 tablet (25 mg total) by mouth every 6 (six) hours.  21 tablet 0  ? methocarbamol (ROBAXIN) 500 MG tablet Take 1 tablet (500 mg total) by mouth 2 (two) times daily. 20 tablet 0  ? naproxen (NAPROSYN) 500 MG tablet Take 1 tablet (500 mg total) by mouth 2 (two) times daily. 30 tablet 0  ? rosuvastatin (CRESTOR) 10 MG tablet Take 10 mg by mouth daily.    ? ?Home: ?Home Living ?Family/patient expects to  be discharged to:: Private residence ?Living Arrangements: Spouse/significant other, Children ?Available Help at Discharge: Family, Available 24 hours/day ?Type of Home: House ?Home Access: Level entry ?Home Layout: Two level, Able to live on main level with bedroom/bathroom (1/2 bath on main level) ?Alternate Level Stairs-Number of Steps: flight ?Bathroom Shower/Tub: Walk-in shower ?Bathroom Toilet: Standard ?Bathroom Accessibility: Yes ?Home Equipment: None ?  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 ?  ?Functional Status:  ?Mobility: ?Bed Mobility ?Overal bed mobility: Needs Assistance ?Bed Mobility: Rolling, Sidelying to Sit, Sit to Sidelying ?Rolling: Min guard ?Sidelying to sit: Min assist ?Sit to supine: Mod assist, +2 for physical assistance ?Sit to sidelying: Min assist ?General bed mobility comments: OOB on arrival ?Transfers ?Overall transfer level: Needs assistance ?Equipment used: Rolling walker (2 wheels) ?Transfers: Sit to/from Stand ?Sit to Stand: Min guard ?Bed to/from chair/wheelchair/BSC transfer type:: Via Lift equipment ?Transfer via Lift Equipment: Stedy ?General transfer comment: min guard to stand and with mobility for safety with RW ?Ambulation/Gait ?Ambulation/Gait assistance: Min assist ?Gait Distance (Feet): 230 Feet ?Assistive device: Rolling walker (2 wheels) ?Gait Pattern/deviations: Step-through pattern ?General Gait Details: mild unsteadiness continues with light moderate use of the RW.  generally a weak heel/toe pattern with consistent soft hyperextension and mild knee "wobble" ant/post R LE > .L LE.  With cues, pt brought gait under control quickly when he started to fatigue or lose focus. ?Gait velocity: decreased ?Gait velocity interpretation: 1.31 - 2.62 ft/sec, indicative of limited community ambulator ?Stairs:  Yes ?Stairs assistance: Min assist ?Stair Management: One rail Right, Step to pattern, Sideways ?Number of Stairs: 4 ?General stair comments: step-to pattern, forward ascending, sideways descending, one instance of R knee buckle when descending step ?  ?ADL: ?ADL ?Overall ADL's : Needs assistance/impaired ?Eating/Feeding: Independent ?Grooming: Wash/dry hands, Wash/dry face, Oral care, Set up, Sitting ?Grooming Details (indicate cue type and reason): seated at sink performing grooming upon entry ?Upper Body Bathing: Set up, Sitting ?Upper Body Bathing Details (indicate cue type and reason): at sink ?Lower Body Bathing: Total assistance, Bed level ?Upper Body Dressing : Supervision/safety, Sitting ?Upper Body Dressing Details (indicate cue type and reason): in recliner ?Lower Body Dressing: Moderate assistance, Sit to/from stand, With adaptive equipment ?Lower Body Dressing Details (indicate cue type and reason): education on reacher, dressing stick, and sock aide use for LB dressing with patient requiring mod assist ?Toilet Transfer: Min guard, Ambulation, Regular Toilet, BSC/3in1, Rolling walker (2 wheels) ?Toilet Transfer Details (indicate cue type and reason): 3n1 over toilet ?Toileting- Clothing Manipulation and Hygiene: Total assistance, Sit to/from stand, Bed level ?Functional mobility during ADLs: Minimal assistance, Rolling walker (2 wheels) ?General ADL Comments: AE training performed for LB dressing. Education on use of 3n1 over toilet and transfer performed ?  ?Cognition: ?Cognition ?Overall Cognitive Status: Within Functional Limits for tasks assessed ?Orientation Level: Oriented X4 ?Cognition ?Arousal/Alertness: Awake/alert ?Behavior During Therapy: [facility] for tasks assessed/performed ?Overall Cognitive Status: Within Functional Limits for tasks assessed ?General Comments: aware of back  precautions, ?  ? ?Blood pressure 105/73, pulse 96, temperature 98.3 ?F (36.8 ?C), temperature source Oral, resp. rate  18, height 6\' 2"  (1.88 m), weight (!) 140.2 kg, SpO2 97 %. ?Physical Exam ?Vitals and nursing note reviewed.  ?Constitutional:   ?   Appearance: Normal appearance. BMI 39.68 ?Abdominal:  ?   General: There is no

## 2021-09-16 DIAGNOSIS — M4714 Other spondylosis with myelopathy, thoracic region: Secondary | ICD-10-CM | POA: Diagnosis not present

## 2021-09-16 LAB — COMPREHENSIVE METABOLIC PANEL
ALT: 46 U/L — ABNORMAL HIGH (ref 0–44)
AST: 29 U/L (ref 15–41)
Albumin: 3.2 g/dL — ABNORMAL LOW (ref 3.5–5.0)
Alkaline Phosphatase: 70 U/L (ref 38–126)
Anion gap: 7 (ref 5–15)
BUN: 13 mg/dL (ref 6–20)
CO2: 24 mmol/L (ref 22–32)
Calcium: 9 mg/dL (ref 8.9–10.3)
Chloride: 104 mmol/L (ref 98–111)
Creatinine, Ser: 0.91 mg/dL (ref 0.61–1.24)
GFR, Estimated: >60 mL/min (ref >60)
Glucose, Bld: 98 mg/dL (ref 70–99)
Potassium: 4.4 mmol/L (ref 3.5–5.1)
Sodium: 135 mmol/L (ref 135–145)
Total Bilirubin: 0.7 mg/dL (ref 0.3–1.2)
Total Protein: 7.6 g/dL (ref 6.5–8.1)

## 2021-09-16 LAB — CBC WITH DIFFERENTIAL/PLATELET
Abs Immature Granulocytes: 0.05 10*3/uL (ref 0.00–0.07)
Basophils Absolute: 0.1 10*3/uL (ref 0.0–0.1)
Basophils Relative: 1 %
Eosinophils Absolute: 0.1 10*3/uL (ref 0.0–0.5)
Eosinophils Relative: 1 %
HCT: 36 % — ABNORMAL LOW (ref 39.0–52.0)
Hemoglobin: 11.8 g/dL — ABNORMAL LOW (ref 13.0–17.0)
Immature Granulocytes: 1 %
Lymphocytes Relative: 28 %
Lymphs Abs: 2.4 10*3/uL (ref 0.7–4.0)
MCH: 30.9 pg (ref 26.0–34.0)
MCHC: 32.8 g/dL (ref 30.0–36.0)
MCV: 94.2 fL (ref 80.0–100.0)
Monocytes Absolute: 1 10*3/uL (ref 0.1–1.0)
Monocytes Relative: 11 %
Neutro Abs: 5 10*3/uL (ref 1.7–7.7)
Neutrophils Relative %: 58 %
Platelets: 406 10*3/uL — ABNORMAL HIGH (ref 150–400)
RBC: 3.82 MIL/uL — ABNORMAL LOW (ref 4.22–5.81)
RDW: 13 % (ref 11.5–15.5)
WBC: 8.6 10*3/uL (ref 4.0–10.5)
nRBC: 0 % (ref 0.0–0.2)

## 2021-09-16 LAB — SEDIMENTATION RATE: Sed Rate: 58 mm/hr — ABNORMAL HIGH (ref 0–16)

## 2021-09-16 LAB — MAGNESIUM: Magnesium: 2.2 mg/dL (ref 1.7–2.4)

## 2021-09-16 LAB — VITAMIN D 25 HYDROXY (VIT D DEFICIENCY, FRACTURES): Vit D, 25-Hydroxy: 12.62 ng/mL — ABNORMAL LOW (ref 30–100)

## 2021-09-16 LAB — C-REACTIVE PROTEIN: CRP: 3.3 mg/dL — ABNORMAL HIGH (ref <1.0)

## 2021-09-16 MED ORDER — VITAMIN D (ERGOCALCIFEROL) 1.25 MG (50000 UNIT) PO CAPS
50000.0000 [IU] | ORAL_CAPSULE | ORAL | Status: DC
  Administered 2021-09-16: 50000 [IU] via ORAL
  Filled 2021-09-16: qty 1

## 2021-09-16 NOTE — Progress Notes (Signed)
Physical Therapy Session Note ? ?Patient Details  ?Name: Jerard Bays ?MRN: [id number] ?Date of Birth: 1985-12-31 ? ?Today's Date: 09/16/2021 ?PT Individual Time: 6720-9470 ?PT Individual Time Calculation (min): 68 min  ? ?Short Term Goals: ?Week 1:  PT Short Term Goal 1 (Week 1): STG=LTG due to LOS ? ?Skilled Therapeutic Interventions/Progress Updates:  ? Received pt sitting in WC playing scrabble with mother present at bedside. Pt agreeable to PT treatment and reported sharp pain surrounding incision (unrated) but reported already receiving pain medication and motivated to participate in session. Session with emphasis on functional mobility/transfers, generalized strengthening and endurance, dynamic standing balance/coordination, and gait training. Provided pt with SPC to try and pt performed all transfers with [facility] and CGA throughout session. Pt ambulated >269ft x 2 trials with SPC and CGA to/from dayroom demonstrating improvements in stride length and cadence with overall less unsteadiness. Pt performed seated BLE strengthening on Nustep at workload 5 for 8 minutes for a total of 421 steps keeping steps per minute >45 - emphasis on cardiovascular endurance and reciprocal movement training. Pt ambulated 66ft with SPC and CGA to mat and requested to work on core strengthening. Pt performed the following exercises with emphasis on UE/LE strength/ROM and core stability: ?-standing lateral flexion with 7lbs 2x10 bilaterally ?-standing mini squats with 4.5lb medicine ball 3x10 (2 reps with elbows extended to target rectus abdominus) ?-seated "driving the car' with 5lb dowel 2x10 with emphasis on pec and abdominal strength ?-seated horizontal chest press at 90 degrees with 5lb dowel 3x10 ?-standing bicep curls with 7lb dumbbell with CGA ?-standing hip abduction with 2.5lb ankle weight 2x12 bilaterally with BUE support and CGA ?-standing hip flexion 2x10 bilaterally with BUE support and CGA ?Returned to room and  reached out to MD requesting grounds pass. Concluded session with pt sitting in WC continuing scrabble game with mother with all needs within reach. Provided pt with fresh ice water.  ? ?Therapy Documentation ?Precautions:  ?Precautions ?Precautions: Fall ?Restrictions ?Weight Bearing Restrictions: No ?Other Position/Activity Restrictions: no brace needed ? ?Therapy/Group: Individual Therapy ?[name] ?Raechel Chute PT, DPT  ?09/16/2021, 7:16 AM  ?

## 2021-09-16 NOTE — Progress Notes (Signed)
?                                                       PROGRESS NOTE ? ? ?Subjective/Complaints: ?No new complaints this morning ?Reviewed labs with him ?Recommended low added sugar/ high protein diet ?Grounds pass ordered ? ?ROS: +spinal pain ? ? ?Objective: ?  ?No results found. ?Recent Labs  ?  09/14/21 ?0735 09/16/21 ?0436  ?WBC 8.3 8.6  ?HGB 12.2* 11.8*  ?HCT 37.9* 36.0*  ?PLT 419* 406*  ? ?Recent Labs  ?  09/14/21 ?0735 09/16/21 ?0436  ?NA 135 135  ?K 4.2 4.4  ?CL 103 104  ?CO2 24 24  ?GLUCOSE 99 98  ?BUN 12 13  ?CREATININE 0.88 0.91  ?CALCIUM 9.2 9.0  ? ? ?Intake/Output Summary (Last 24 hours) at 09/16/2021 1731 ?Last data filed at 09/16/2021 1345 ?Gross per 24 hour  ?Intake 120 ml  ?Output 1000 ml  ?Net -880 ml  ?  ? ?  ? ?Physical Exam: ?Vital Signs ?Blood pressure 105/61, pulse 85, temperature 97.7 ?F (36.5 ?C), temperature source Oral, resp. rate 18, height 6\' 2"  (1.88 m), weight (!) 140.2 kg, SpO2 97 %. ? ?Gen: no distress, normal appearing ?HEENT: oral mucosa pink and moist, NCAT ?Cardio: Reg rate ?Chest: normal effort, normal rate of breathing ?Abd: soft, non-distended ?Ext: no edema ?Psych: pleasant, normal affect ?Skin: intact ?Neurological:  ?   Mental Status: He is alert and oriented to person, place, and time. Good historian.  ?   Comments: RLE>LLE weakness with patchy areas of decreased sensation from umbilicus <L2- S2   ? ?Assessment/Plan: ?1. Functional deficits which require 3+ hours per day of interdisciplinary therapy in a comprehensive inpatient rehab setting. ?Physiatrist is providing close team supervision and 24 hour management of active medical problems listed below. ?Physiatrist and rehab team continue to assess barriers to discharge/monitor patient progress toward functional and medical goals ? ?Care Tool: ? ?Bathing ?   ?Body parts bathed by patient: Right arm, Left arm, Chest, Abdomen, Front perineal area, Buttocks, Right upper leg, Face, Left upper leg  ? Body parts bathed by  helper: Right lower leg, Left lower leg ?  ?  ?Bathing assist Assist Level: Minimal Assistance - Patient > 75% ?  ?  ?Upper Body Dressing/Undressing ?Upper body dressing   ?What is the patient wearing?: Pull over shirt ?   ?Upper body assist Assist Level: Independent ?   ?Lower Body Dressing/Undressing ?Lower body dressing ? ? ?   ?What is the patient wearing?: Pants ? ?  ? ?Lower body assist Assist for lower body dressing: Contact Guard/Touching assist ?   ? ?Toileting ?Toileting    ?Toileting assist Assist for toileting: Moderate Assistance - Patient 50 - 74% ?  ?  ?Transfers ?Chair/bed transfer ? ?Transfers assist ?   ? ?Chair/bed transfer assist level: Minimal Assistance - Patient > 75% ?  ?  ?Locomotion ?Ambulation ? ? ?Ambulation assist ? ?   ? ?Assist level: Minimal Assistance - Patient > 75% ?Assistive device: No Device ?Max distance: 227ft  ? ?Walk 10 feet activity ? ? ?Assist ?   ? ?Assist level: Minimal Assistance - Patient > 75% ?Assistive device: No Device  ? ?Walk 50 feet activity ? ? ?Assist   ? ?Assist level: Minimal Assistance - Patient > 75% ?Assistive  device: No Device  ? ? ?Walk 150 feet activity ? ? ?Assist   ? ?Assist level: Minimal Assistance - Patient > 75% ?Assistive device: No Device ?  ? ?Walk 10 feet on uneven surface  ?activity ? ? ?Assist   ? ? ?Assist level: Minimal Assistance - Patient > 75% ?Assistive device: Other (comment) (none)  ? ?Wheelchair ? ? ? ? ?Assist Is the patient using a wheelchair?: Yes ?Type of Wheelchair: Manual ?  ? ?Wheelchair assist level: Dependent - Patient 0% ?Max wheelchair distance: >151ft  ? ? ?Wheelchair 50 feet with 2 turns activity ? ? ? ?Assist ? ?  ?  ? ? ?Assist Level: Dependent - Patient 0%  ? ?Wheelchair 150 feet activity  ? ? ? ?Assist ?   ? ? ?Assist Level: Dependent - Patient 0%  ? ?Blood pressure 105/61, pulse 85, temperature 97.7 ?F (36.5 ?C), temperature source Oral, resp. rate 18, height 6\' 2"  (1.88 m), weight (!) 140.2 kg, SpO2 97  %. ? ?Medical Problem List and Plan: ?1. 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            -patient may shower but incision must be covered.  ?            -ELOS/Goals: 5 days ?            -HFU scheduled ?2.  Antithrombotics: ?-DVT/anticoagulation:  Pharmaceutical: Lovenox ?            -antiplatelet therapy: N/A ?3. Spinal pain: Schedule Tylenol 650mg  TID. Oxycodone prn effective. ?4. Mood: LCSW to follow for evaluation and support.  ?            -antipsychotic agents: N/A ?5. Neuropsych: This patient is capable of making decisions on his own behalf. ?6. Skin/Wound Care: Monitor incision for healing. Routine pressure relief measures ?7. Fluids/Electrolytes/Nutrition: Monitor I/O. Check CMET in am.  ?8. Epidural abscess: Continue Cefazolin  for 6 weeks--end date 10/16/21 ?            --Sed rate/CRP tomorrow then per protocol.  ?9. Constipation: Will order MOM today. Continue Senna S 2 tabs at bedtime. Check magnesium level tomorrow.  ?--May need Senna S increased to bid ?10. Neurogenic bladder: Additional dose today and then increase Flomax to 0.8 mg/hs.  ?            --will monitor voiding with bladder scan/PVR checks.  ?11. Acute blood loss anemia: Recheck CBC in am.   ?12. Vitamin D deficiency: start ergocalciferol 50,000U once per week for 7 weeks. ?13. Hypoalbuminemia: educated regarding importance of low added sugar/high protein diet ?14. Transaminitis: discussed repeating LFTs prior to discharge.  ?  ? ?LOS: ?1 days ?A FACE TO FACE EVALUATION WAS PERFORMED ? ? P Justise Ehmann ?09/16/2021, 5:31 PM  ? ?  ?

## 2021-09-16 NOTE — Progress Notes (Signed)
Inpatient Rehabilitation Center ?Individual Statement of Services ? ?Patient Name:  Cristian Grant  ?Date:  09/16/2021 ? ?Welcome to the [facility].  Our goal is to provide you with an individualized program based on your diagnosis and situation, designed to meet your specific needs.  With this comprehensive rehabilitation program, you will be expected to participate in at least 3 hours of rehabilitation therapies Monday-Friday, with modified therapy programming on the weekends. ? ?Your rehabilitation program will include the following services:  Physical Therapy (PT), Occupational Therapy (OT), Speech Therapy (ST), 24 hour per day rehabilitation nursing, Therapeutic Recreaction (TR), Neuropsychology, Care Coordinator, Rehabilitation Medicine, Nutrition Services, Pharmacy Services, and Other ? ?Weekly team conferences will be held on Wednesdays to discuss your progress.  Your Inpatient Rehabilitation Care Coordinator will talk with you frequently to get your input and to update you on team discussions.  Team conferences with you and your family in attendance may also be held. ? ?Expected length of stay:  10-12 Days  Overall anticipated outcome: Mod I to Supervision  ? ?Depending on your progress and recovery, your program may change. Your Inpatient Rehabilitation Care Coordinator will coordinate services and will keep you informed of any changes. Your Inpatient Rehabilitation Care Coordinator's name and contact numbers are listed  below. ? ?The following services may also be recommended but are not provided by the [facility]:  ? ?Home Health Rehabiltiation Services ?Outpatient Rehabilitation Services ? ?Arrangements will be made to provide these services after discharge if needed.  Arrangements include referral to agencies that provide these services. ? ?Your insurance has been verified to be:   [employer]  ?Your primary doctor is:  NO PCP ? ?Pertinent information will be  shared with your doctor and your insurance company. ? ?Inpatient Rehabilitation Care Coordinator:  Erlene Quan, [place] or ([phone] ? ?Information discussed with and copy given to patient by: Dyanne Iha, 09/16/2021, 3:35 PM    ?

## 2021-09-16 NOTE — Progress Notes (Signed)
Patient ID: Cristian Grant, male   DOB: 1986/06/06, 36 y.o.   MRN: [id number] ?Team Conference Report to Patient/Family ? ?Team Conference discussion was reviewed with the patient and caregiver, including goals, any changes in plan of care and target discharge date.  Patient and caregiver express understanding and are in agreement.  The patient has a target discharge date of 09/22/21. ? ?Sw met with patient introduced self and explained role. The patient plans to discharge home with spouse to assist. Spouse currently still works. Patient anticipated to d/c on Tuesday. Patient would like him and spouse to be educated on IV ABX tomorrow between 3-5 PM. No additional questions or concerns, SW will continue to follow up as recommendations are added. ? ?Dyanne Iha ?09/16/2021, 12:56 PM  ?

## 2021-09-16 NOTE — Progress Notes (Signed)
Inpatient Rehabilitation Care Coordinator ?Assessment and Plan ?Patient Details  ?Name: Cristian Grant ?MRN: [id number] ?Date of Birth: 03-Jun-1986 ? ?Today's Date: 09/16/2021 ? ?Hospital Problems: Principal Problem: ?  Thoracic myelopathy ? ?Past Medical History:  ?Past Medical History:  ?Diagnosis Date  ? Hyperlipidemia   ? ?Past Surgical History:  ?Past Surgical History:  ?Procedure Laterality Date  ? LUMBAR LAMINECTOMY/DECOMPRESSION MICRODISCECTOMY N/A 09/04/2021  ? Procedure: THORACIC FIVE, THORACIC SIX, THORACIC SEVEN LAMINECTOMY FOR DECOMPRESSION AND  RESECTION OF SYNOVIAL CYST;  Surgeon: Dawley, Theodoro Doing, DO;  Location: [place];  Service: Neurosurgery;  Laterality: N/A;  ? TEE WITHOUT CARDIOVERSION N/A 09/14/2021  ? Procedure: TRANSESOPHAGEAL ECHOCARDIOGRAM (TEE);  Surgeon: Acie Fredrickson Wonda Cheng, MD;  Location: [place];  Service: Cardiovascular;  Laterality: N/A;  ? ?Social History:  reports that he has never smoked. He has never used smokeless tobacco. He reports current alcohol use. He reports that he does not currently use drugs. ? ?Family / Support Systems ?Spouse/Significant Other: [name] ?Other Supports: mother ?Anticipated Caregiver: Ubaldo Glassing ?Ability/Limitations of Caregiver: Min A ?Caregiver Availability: 24/7 ?Family Dynamics: support from spouse and mother ? ?Social History ?Preferred language: English ?Religion:  ?Health Literacy - How often do you need to have someone help you when you read instructions, pamphlets, or other written material from your doctor or pharmacy?: Never ?Writes: Yes ?Employment Status: Employed ?Legal History/Current Legal Issues: n/a ?Guardian/Conservator: n/a  ? ?Abuse/Neglect ?Abuse/Neglect Assessment Can Be Completed: Yes ?Physical Abuse: Denies ?Verbal Abuse: Denies ?Sexual Abuse: Denies ?Exploitation of patient/patient's resources: Denies ?Self-Neglect: Denies ? ?Patient response to: ?Social Isolation - How often do you feel lonely or isolated from those around you?:  Never ? ?Emotional Status ?Recent Psychosocial Issues: coping ?Psychiatric History: n/a ?Substance Abuse History: n/a ? ?Patient / Family Perceptions, Expectations & Goals ?Pt/Family understanding of illness & functional limitations: yes ?Premorbid pt/family roles/activities: independent ?Anticipated changes in roles/activities/participation: spouse able to assist at d/c ?Pt/family expectations/goals: supervision ? ?Community Resources ?Community Agencies: None ?Premorbid Home Care/DME Agencies: None ?Transportation available at discharge: spouse or mother able to transport ?Is the patient able to respond to transportation needs?: Yes ?In the past 12 months, has lack of transportation kept you from medical appointments or from getting medications?: No ?In the past 12 months, has lack of transportation kept you from meetings, work, or from getting things needed for daily living?: No ? ?Discharge Planning ?Living Arrangements: Spouse/significant other ?Support Systems: Spouse/significant other ?Type of Residence: Private residence ?Insurance Resources: Multimedia programmer (specify) Nurse, mental health) ?Financial Resources: Family Support ?Financial Screen Referred: No ?Living Expenses: Lives with family ?Does the patient have any problems obtaining your medications?: No ?Care Coordinator Barriers to Discharge: Insurance for SNF coverage, Decreased caregiver support, IV antibiotics ?Care Coordinator Anticipated Follow Up Needs: HH/OP ? ?Clinical Impression ?Sw met with patient introduced self and explained role. Patient anticipated to d/c this upcoming Tuesday. Family teaching for IV ABX has been scheduled. Sw will continue to follow up.  ? ?Dyanne Iha ?09/16/2021, 3:35 PM ? ?  ?

## 2021-09-16 NOTE — Evaluation (Signed)
Occupational Therapy Assessment and Plan ? ?Patient Details  ?Name: Cristian Grant ?MRN: [id number] ?Date of Birth: 15-Jul-1985 ? ?OT Diagnosis: acute pain and muscle weakness (generalized) ?Rehab Potential: Rehab Potential (ACUTE ONLY): Good ?ELOS: 5-7 days  ? ?Today's Date: 09/16/2021 ?OT Individual Time: 7893-8101 ?OT Individual Time Calculation (min): 71 min    ? ?Hospital Problem: Principal Problem: ?  Thoracic myelopathy ? ? ?Past Medical History:  ?Past Medical History:  ?Diagnosis Date  ? Hyperlipidemia   ? ?Past Surgical History:  ?Past Surgical History:  ?Procedure Laterality Date  ? LUMBAR LAMINECTOMY/DECOMPRESSION MICRODISCECTOMY N/A 09/04/2021  ? Procedure: THORACIC FIVE, THORACIC SIX, THORACIC SEVEN LAMINECTOMY FOR DECOMPRESSION AND  RESECTION OF SYNOVIAL CYST;  Surgeon: Dawley, Alan Mulder, DO;  Location: [facility];  Service: Neurosurgery;  Laterality: N/A;  ? TEE WITHOUT CARDIOVERSION N/A 09/14/2021  ? Procedure: TRANSESOPHAGEAL ECHOCARDIOGRAM (TEE);  Surgeon: Elease Hashimoto Deloris Ping, MD;  Location: [facility];  Service: Cardiovascular;  Laterality: N/A;  ? ? ?Assessment & Plan ?Clinical Impression: Cristian Peerson. Grant is a 36 year old male in relatively good health who started developing progressive back pain 24 hours after lifting an amplifier work on 09/01/21 with onset of progressive back pain followed by fall and had with progressive BLE weakness. He was admitted via ED on 09/04/21 with chest pain/tightness, numbness from abdomen down and inability to walk. 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 He was taken to OR for bilateral T5-T7 laminectomy with T6/T7 medial facetectomy by Dr. Jake Samples on the same day. Small amount of pus encountered concerning for epidural abscess  which was sent for culture and patient started on IV Vanc.  ?  ?Post op had improvement in sensory/motor return and ID consulted for input. Cultures without WBC and showed rare Staph aureus. TEE done was negative for endocarditis and Dr. Earlene Plater recommends IV cefazolin X 6 weeks with end date of 10/16/21 and recs to check Sed rate/CRP every 14 days.  Foley was placed for neurogenic bladder d/c  04/07 but reports difficulty with voiding as well as issues with constipation. He continues to be limited by LE weakness with sensory deficits affecting mobility and ADLs. CIR recommended due to functional decline.  He asks about exercises for weight loss. Patient transferred to CIR on 09/15/2021 .   ? ?Patient currently requires min with basic self-care skills secondary to muscle weakness, decreased cardiorespiratoy endurance, and decreased standing balance, decreased postural control, and decreased balance strategies.  Prior to hospitalization, patient could complete ADLs with independent . ? ?Patient will benefit from skilled intervention to decrease level of assist with basic self-care skills and increase level of independence with iADL prior to discharge home with care partner.  Anticipate patient will require 24 hour supervision and follow up outpatient. ? ?OT - End of Session ?Activity Tolerance: Tolerates 30+ min activity without fatigue ?Endurance Deficit: Yes ?Endurance Deficit Description: generalized weakness ?OT Assessment ?Rehab Potential (ACUTE ONLY): Good ?OT Patient demonstrates impairments in the following area(s): Balance;Sensory;Skin Integrity;Endurance;Motor;Pain ?OT Basic ADL's Functional Problem(s): Bathing;Dressing;Toileting ?OT Advanced ADL's Functional Problem(s): Simple Meal Preparation ?OT Transfers Functional Problem(s): Toilet;Tub/Shower ?OT Additional Impairment(s): None ?OT Plan ?OT Intensity: Minimum of 1-2 x/day, 45 to 90 minutes ?OT Frequency: 5 out of 7 days ?OT Duration/Estimated Length of  Stay: 5-7 days ?OT Treatment/Interventions: Metallurgist  training;Discharge planning;Pain management;Self Care/advanced ADL retraining;Therapeutic Activities;Therapeutic Exercise;Skin care/wound managment;Patient/family education;Functional mobility training;Community reintegration;DME/adaptive equipment instruction;Psychosocial support ?OT Self Feeding Anticipated Outcome(s): no goal set ?OT Basic Self-Care Anticipated Outcome(s): mod I ?OT Toileting Anticipated Outcome(s): mod I ?OT Bathroom Transfers Anticipated Outcome(s): (S) ?OT Recommendation ?Patient destination: Home ?Follow Up Recommendations: Outpatient OT ?Equipment Recommended: To be determined ? ? ?OT Evaluation ?Precautions/Restrictions  ?Precautions ?Precautions: Back;Fall ?Restrictions ?Weight Bearing Restrictions: No ?Other Position/Activity Restrictions: no brace needed ?General ?Chart Reviewed: Yes ?Family/Caregiver Present: No ?Vital Signs ?Therapy Vitals ?Temp: 98.6 ?F (37 ?C) ?Pulse Rate: 86 ?Resp: 15 ?BP: 107/70 ?Patient Position (if appropriate): Lying ?Oxygen Therapy ?SpO2: 95 % ?O2 Device: Room Air ?Pain ?Pain Assessment ?Pain Scale: 0-10 ?Pain Score: 2  ?Pain Type: Acute pain;Surgical pain ?Pain Location: Back ?Pain Orientation: Mid ?Pain Descriptors / Indicators: Aching ?Pain Onset: On-going ?Pain Intervention(s): Repositioned;Ambulation/increased activity ?Home Living/Prior Functioning ?Home Living ?Family/patient expects to be discharged to:: Private residence ?Living Arrangements: Spouse/significant other ?Available Help at Discharge: Family, Available 24 hours/day ?Type of Home: House ?Home Access: Level entry ?Home Layout: Two level, Bed/bath upstairs ?Alternate Level Stairs-Number of Steps: flight ?Alternate Level Stairs-Rails: Right ?Bathroom Shower/Tub: Walk-in shower ?Bathroom Toilet: Standard ?Bathroom Accessibility: Yes ? Lives With: Spouse, Daughter, Son ?IADL History ?Homemaking Responsibilities: Yes ?Meal Prep  Responsibility: Primary ?Laundry Responsibility: Primary ?Cleaning Responsibility: Secondary ?Bill Paying/Finance Responsibility: Primary ?Shopping Responsibility: Secondary ?Child Care Responsibility: Secondary ?Current License: Yes ?Mode of Transportation: Car ?Occupation: Full time employment ?Type of Occupation: Financial planner at [employer] ?Leisure and Hobbies: Plays the trombone ?Prior Function ?Level of Independence: Independent with basic ADLs, Independent with transfers, Independent with gait ?Driving: Yes ?Vocation: Full time employment ?Vision ?Baseline Vision/History: 0 No visual deficits ?Ability to See in Adequate Light: 0 Adequate ?Patient Visual Report: No change from baseline ?Vision Assessment?: No apparent visual deficits ?Perception  ?Perception: Within Functional Limits ?Praxis ?Praxis: Intact ?Cognition ?Cognition ?Overall Cognitive Status: Within Functional Limits for tasks assessed ?Arousal/Alertness: Awake/alert ?Orientation Level: Person;Place;Situation ?Person: Oriented ?Place: Oriented ?Situation: Oriented ?Memory: Appears intact ?Attention: Alternating ?Awareness: Appears intact ?Problem Solving: Appears intact ?Safety/Judgment: Appears intact ?Brief Interview for Mental Status (BIMS) ?Repetition of Three Words (First Attempt): 3 ?Temporal Orientation: Year: Correct ?Temporal Orientation: Month: Accurate within 5 days ?Temporal Orientation: Day: Correct ?Recall: "Sock": Yes, no cue required ?Recall: "Blue": Yes, no cue required ?Recall: "Bed": Yes, after cueing ("a piece of furniture") ?BIMS Summary Score: 14 ?Sensation ?Sensation ?Light Touch: Impaired Detail ?Central sensation comments: Reports of patchy numbness throughout the BLE, calf, thigh, feet, but overall intact to testing ?Coordination ?Gross Motor Movements are Fluid and Coordinated: No ?Fine Motor Movements are Fluid and Coordinated: Yes ?Coordination and Movement Description: Pain and ataxic gait ?Motor  ?Motor ?Motor:  Ataxia ?Motor - Skilled Clinical Observations: Ataxic gait with pain  ?Trunk/Postural Assessment  ?Cervical Assessment ?Cervical Assessment: Within Functional Limits ?Thoracic Assessment ?Thoracic Assessment: Excep

## 2021-09-16 NOTE — Progress Notes (Signed)
Inpatient Rehabilitation  Patient information reviewed and entered into eRehab system by Yon Schiffman M. Sache Sane, M.A., CCC/SLP, PPS Coordinator.  Information including medical coding, functional ability and quality indicators will be reviewed and updated through discharge.    

## 2021-09-16 NOTE — Plan of Care (Signed)
Nutrition Education Note ? ?RD consulted for nutrition diet education. ? ?RD provided "General, Healthful Nutrition therapy" handout from the [employer]. Reviewed patient's dietary recall. Pt reports eating fast food/restaurant food every other day prior to admission and now motivated to follow a more healthy diet currently and when discharge home. Encouraged fresh fruits and vegetables as well as whole grain sources of carbohydrates to maximize fiber intake. Discussed lean protein intake at meals. Discussed healthy snack and drink options. Teach back method used. ? ?Current diet order is regular diet with thin liquids, patient is consuming approximately 100% of meals at this time. Labs and medications reviewed. Pt currently has juven ordered and has been consuming them. Will continue with current orders.  No further nutrition interventions warranted at this time. RD contact information provided. If additional nutrition issues arise, please re-consult RD. ? ?Cristian Smiling, MS, RD, LDN ?RD pager number/after hours weekend pager number on Amion. ?  ?

## 2021-09-16 NOTE — Progress Notes (Signed)
Patient refused CPAP for the night  

## 2021-09-16 NOTE — Evaluation (Signed)
Physical Therapy Assessment and Plan ? ?Patient Details  ?Name: Cristian Grant ?MRN: [id number] ?Date of Birth: 1986/03/12 ? ?PT Diagnosis: Abnormality of gait, Ataxia, Ataxic gait, Coordination disorder, Difficulty walking, Impaired sensation, Low back pain, Muscle weakness, and Pain in surgical incision in back ?Rehab Potential: Good ?ELOS: about 5  ? ?Today's Date: 09/16/2021 ?PT Individual Time: [id number] ?PT Individual Time Calculation (min): 72 min   ? ?Hospital Problem: Principal Problem: ?  Thoracic myelopathy ? ? ?Past Medical History:  ?Past Medical History:  ?Diagnosis Date  ? Hyperlipidemia   ? ?Past Surgical History:  ?Past Surgical History:  ?Procedure Laterality Date  ? LUMBAR LAMINECTOMY/DECOMPRESSION MICRODISCECTOMY N/A 09/04/2021  ? Procedure: THORACIC FIVE, THORACIC SIX, THORACIC SEVEN LAMINECTOMY FOR DECOMPRESSION AND  RESECTION OF SYNOVIAL CYST;  Surgeon: Dawley, Theodoro Doing, DO;  Location: [facility];  Service: Neurosurgery;  Laterality: N/A;  ? TEE WITHOUT CARDIOVERSION N/A 09/14/2021  ? Procedure: TRANSESOPHAGEAL ECHOCARDIOGRAM (TEE);  Surgeon: Acie Fredrickson Wonda Cheng, MD;  Location: [facility];  Service: Cardiovascular;  Laterality: N/A;  ? ? ?Assessment & Plan ?Clinical Impression: Patient is a 36 y.o. year old male who started developing progressive back pain 24 hours after lifting an amplifier work on 09/01/21 with onset of progressive back pain followed by fall and had with progressive BLE weakness. He was admitted via ED on 09/04/21 with chest pain/tightness, numbness from abdomen down and inability to walk. 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 He was taken to OR for bilateral T5-T7 laminectomy with T6/T7 medial facetectomy by Dr. [name] on the same day. Small  amount of pus encountered concerning for epidural abscess which was sent for culture and patient started on IV Vanc.  ?  ?Post op had improvement in sensory/motor return and ID consulted for input. Cultures without WBC and showed rare Staph aureus. TEE done was negative for endocarditis and Dr. [name] recommends IV cefazolin X 6 weeks with end date of 10/16/21 and recs to check Sed rate/CRP every 14 days.  Foley was placed for neurogenic bladder d/c  04/07 but reports difficulty with voiding as well as issues with constipation. He continues to be limited by LE weakness with sensory deficits affecting mobility and ADLs. CIR recommended due to functional decline.  He asks about exercises for weight loss.  ? ?Patient currently requires min with mobility secondary to muscle weakness and muscle paralysis, decreased cardiorespiratoy endurance, unbalanced muscle activation and decreased coordination, and decreased standing balance, decreased postural control, and decreased balance strategies.  Prior to hospitalization, patient was independent  with mobility and lived with Spouse, Daughter, Son in a House home.  Home access is  Level entry. ? ?Patient will benefit from skilled PT intervention to maximize safe functional mobility, minimize fall risk, and decrease caregiver burden for planned discharge home with 24 hour supervision.  Anticipate patient will benefit from follow up OP at discharge. ? ?PT - End of Session ?Activity Tolerance: Tolerates 30+ min activity with multiple rests ?Endurance Deficit: Yes ?Endurance Deficit Description: reqiured rest breaks ?PT Assessment ?Rehab Potential (ACUTE/IP ONLY): Good ?PT Barriers to Discharge: Home environment access/layout;Neurogenic Bowel & Bladder;Wound Care ?PT Barriers to Discharge Comments: flight of steps to get to bedroom, altered sensations regarding bowel/bladder management, and surgical incision to back ?PT Patient demonstrates impairments in the following area(s):  Balance;Endurance;Motor;Pain;Perception;Sensory;Skin Integrity ?PT Transfers Functional Problem(s): Bed Mobility;Bed to Chair;Car;Furniture ?PT Locomotion Functional Problem(s): Ambulation;Wheelchair Mobility;Stairs ?PT Plan ?PT Intensity: Minimum of 1-2 x/day ,45 to 90 minutes ?PT Frequency: 5 out of 7 days ?PT Duration Estimated Length of Stay: about 5 ?PT Treatment/Interventions: Ambulation/gait training;Discharge planning;Functional mobility training;Psychosocial support;Therapeutic Activities;Balance/vestibular training;Disease management/prevention;Neuromuscular re-education;Skin care/wound management;Therapeutic Exercise;Wheelchair propulsion/positioning;DME/adaptive equipment instruction;Pain management;Splinting/orthotics;UE/LE Strength taining/ROM;Community reintegration;Patient/family education;Stair training;UE/LE Coordination activities ?PT Transfers Anticipated Outcome(s): Mod I ?PT Locomotion Anticipated Outcome(s): Mod I ?PT Recommendation ?Follow Up Recommendations: Outpatient PT ?Patient destination: Home ?Equipment Recommended: To be determined ?Equipment Details: has none ? ?PT Evaluation ?Precautions/Restrictions ?Precautions ?Precautions: Fall ?Restrictions ?Weight Bearing Restrictions: No ?Other Position/Activity Restrictions: no brace needed ?Pain Interference ?Pain Interference ?Pain Effect on Sleep: 2. Occasionally ?Pain Interference with Therapy Activities: 1. Rarely or not at all ?Pain Interference with Day-to-Day Activities: 1. Rarely or not at all ?Home Living/Prior Functioning ?Home Living ?Available Help at Discharge: Family;Available 24 hours/day ?Type of Home: House ?Home Access: Level entry ?Home Layout: Two level;Bed/bath upstairs ?Alternate Level Stairs-Number of Steps: flight (about 15) ?Alternate Level Stairs-Rails: Right ?Bathroom Shower/Tub: Walk-in shower ?Bathroom Toilet: Standard ?Bathroom Accessibility: Yes ?Additional Comments: pt reports his wife does virtual school  from home ? Lives With: Spouse;Daughter;Son ?Prior Function ?Level of Independence: Independent with basic ADLs;Independent with transfers;Independent with gait;Independent with homemaking with ambulation ? 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 ?Vision/Perception  ?Vision - History ?Ability to See in Adequate Light: 0 Adequate ?Perception ?Perception: Within Functional Limits ?Praxis ?Praxis: Intact  ?Cognition ?Overall Cognitive Status: Within Functional Limits for tasks assessed ?Arousal/Alertness: Awake/alert ?Orientation Level: Oriented X4 ?Attention: Alternating ?Memory: Appears intact ?Awareness: Appears intact ?Problem Solving: Appears intact ?Safety/Judgment: Appears intact ?Sensation ?Sensation ?Light Touch: Impaired Detail ?Proprioception: Impaired by gross assessment ?Additional Comments: Pt reports more numbness on RLE and more tingling/hypersensitivity on LLE. Pt reports he can feel the urge to void/have BM but is unable to feel when he's done and also reports slight hesistancy. ?Coordination ?Gross Motor Movements are Fluid and Coordinated: No ?Fine Motor Movements are Fluid and Coordinated: Yes ?Coordination and Movement Description: grossly uncoordinated due to weakness, decreased balance, impaired motor coordination/sequencing, slight ataxia, and impaired proprioception. ?Finger Nose Finger Test: [facility] bilaterally ?Heel Shin Test: [facility] bilaterally ?Motor  ?Motor ?Motor: Ataxia ?Motor - Skilled Clinical Observations: grossly uncoordinated due to weakness, decreased balance, impaired motor coordination/sequencing, slight ataxia, and impaired proprioception.  ?Trunk/Postural Assessment  ?Cervical Assessment ?Cervical Assessment: Within Functional Limits ?Thoracic Assessment ?Thoracic Assessment: Exceptions to [facility] (decreased ROM due to surgical incision) ?Lumbar Assessment ?Lumbar Assessment: Within Functional  Limits ?Postural Control ?Postural Control: Deficits on evaluation ?Righting Reactions: slightly delayed with dynamic standing balance  ?Balance ?Balance ?Balance Assessed: Yes ?Static Sitting Balance ?Static Sitting

## 2021-09-16 NOTE — Progress Notes (Signed)
Well settled in after admission to the unit last night. Pain controlled with prescribed medications. No shortness of breath noted while asleep. Refused CPAP [place]. CHG bath completed. IV antibiotics therapy continues. PICC line on Right arm working well. Safety maintained at all times. ?

## 2021-09-17 ENCOUNTER — Inpatient Hospital Stay: Payer: Self-pay

## 2021-09-17 DIAGNOSIS — M4714 Other spondylosis with myelopathy, thoracic region: Secondary | ICD-10-CM | POA: Diagnosis not present

## 2021-09-17 MED ORDER — ALTEPLASE 2 MG IJ SOLR
2.0000 mg | Freq: Once | INTRAMUSCULAR | Status: DC
  Filled 2021-09-17: qty 2

## 2021-09-17 MED ORDER — CEFAZOLIN IV (FOR PTA / DISCHARGE USE ONLY): 2.0000 g | Freq: Three times a day (TID) | INTRAVENOUS | 0 refills | Status: AC

## 2021-09-17 NOTE — Progress Notes (Signed)
?                                                       PROGRESS NOTE ? ? ?Subjective/Complaints: ?Ambulating really well in hallway with Lovena Le ?He asks about what kind of nuts are healthy and I discussed with him benefits of different types of nuts ?Provided list of foods that can help with weight loss as well.   ? ?ROS: +spinal pain, denies insomnia ? ? ?Objective: ?  ?No results found. ?Recent Labs  ?  09/14/21 ?0735 09/16/21 ?0436  ?WBC 8.3 8.6  ?HGB 12.2* 11.8*  ?HCT 37.9* 36.0*  ?PLT 419* 406*  ? ?Recent Labs  ?  09/14/21 ?0735 09/16/21 ?0436  ?NA 135 135  ?K 4.2 4.4  ?CL 103 104  ?CO2 24 24  ?GLUCOSE 99 98  ?BUN 12 13  ?CREATININE 0.88 0.91  ?CALCIUM 9.2 9.0  ? ? ?Intake/Output Summary (Last 24 hours) at 09/17/2021 0709 ?Last data filed at 09/17/2021 0132 ?Gross per 24 hour  ?Intake 639.01 ml  ?Output 600 ml  ?Net 39.01 ml  ?  ? ?  ? ?Physical Exam: ?Vital Signs ?Blood pressure 94/60, pulse 92, temperature 98.6 ?F (37 ?C), resp. rate 14, height 6\' 2"  (1.88 m), weight (!) 140.2 kg, SpO2 95 %. ? ?Gen: no distress, normal appearing, BMI 39.68 ?HEENT: oral mucosa pink and moist, NCAT ?Cardio: Reg rate ?Chest: normal effort, normal rate of breathing ?Abd: soft, non-distended ?Ext: no edema ?Psych: pleasant, normal affect ?Skin: intact ?Neurological:  ?   Mental Status: He is alert and oriented to person, place, and time. Good historian.  ?   Comments: RLE>LLE weakness with patchy areas of decreased sensation from umbilicus <L2- S2   ?Ambulating S with cane ? ?Assessment/Plan: ?1. Functional deficits which require 3+ hours per day of interdisciplinary therapy in a comprehensive inpatient rehab setting. ?Physiatrist is providing close team supervision and 24 hour management of active medical problems listed below. ?Physiatrist and rehab team continue to assess barriers to discharge/monitor patient progress toward functional and medical goals ? ?Care Tool: ? ?Bathing ?   ?Body parts bathed by patient: Right arm, Left  arm, Chest, Abdomen, Front perineal area, Buttocks, Right upper leg, Face, Left upper leg  ? Body parts bathed by helper: Right lower leg, Left lower leg ?  ?  ?Bathing assist Assist Level: Minimal Assistance - Patient > 75% ?  ?  ?Upper Body Dressing/Undressing ?Upper body dressing   ?What is the patient wearing?: Pull over shirt ?   ?Upper body assist Assist Level: Independent ?   ?Lower Body Dressing/Undressing ?Lower body dressing ? ? ?   ?What is the patient wearing?: Pants ? ?  ? ?Lower body assist Assist for lower body dressing: Contact Guard/Touching assist ?   ? ?Toileting ?Toileting    ?Toileting assist Assist for toileting: Moderate Assistance - Patient 50 - 74% ?  ?  ?Transfers ?Chair/bed transfer ? ?Transfers assist ?   ? ?Chair/bed transfer assist level: Minimal Assistance - Patient > 75% ?  ?  ?Locomotion ?Ambulation ? ? ?Ambulation assist ? ?   ? ?Assist level: Minimal Assistance - Patient > 75% ?Assistive device: No Device ?Max distance: 215ft  ? ?Walk 10 feet activity ? ? ?Assist ?   ? ?Assist level: Minimal Assistance - Patient >  75% ?Assistive device: No Device  ? ?Walk 50 feet activity ? ? ?Assist   ? ?Assist level: Minimal Assistance - Patient > 75% ?Assistive device: No Device  ? ? ?Walk 150 feet activity ? ? ?Assist   ? ?Assist level: Minimal Assistance - Patient > 75% ?Assistive device: No Device ?  ? ?Walk 10 feet on uneven surface  ?activity ? ? ?Assist   ? ? ?Assist level: Minimal Assistance - Patient > 75% ?Assistive device: Other (comment) (none)  ? ?Wheelchair ? ? ? ? ?Assist Is the patient using a wheelchair?: Yes ?Type of Wheelchair: Manual ?  ? ?Wheelchair assist level: Dependent - Patient 0% ?Max wheelchair distance: >122ft  ? ? ?Wheelchair 50 feet with 2 turns activity ? ? ? ?Assist ? ?  ?  ? ? ?Assist Level: Dependent - Patient 0%  ? ?Wheelchair 150 feet activity  ? ? ? ?Assist ?   ? ? ?Assist Level: Dependent - Patient 0%  ? ?Blood pressure 94/60, pulse 92, temperature 98.6 ?F  (37 ?C), resp. rate 14, height 6\' 2"  (1.88 m), weight (!) 140.2 kg, SpO2 95 %. ? ?Medical Problem List and Plan: ?1. 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            -patient may shower but incision must be covered.  ?            -ELOS/Goals: 5 days ?            -HFU scheduled ? Continue CIR ?2.  Antithrombotics: ?-DVT/anticoagulation:  Pharmaceutical: Lovenox ?            -antiplatelet therapy: N/A ?3. Spinal pain: Schedule Tylenol 650mg  TID. Oxycodone prn effective. Repeat LFTs tomorrow. ?4. Mood: LCSW to follow for evaluation and support.  ?            -antipsychotic agents: N/A ?5. Neuropsych: This patient is capable of making decisions on his own behalf. ?6. Skin/Wound Care: Monitor incision for healing. Routine pressure relief measures ?7. Fluids/Electrolytes/Nutrition: Monitor I/O. Check CMET in am.  ?8. Epidural abscess: Continue Cefazolin  for 6 weeks--end date 10/16/21 ?            --Sed rate/CRP tomorrow then per protocol.  ?9. Constipation: Will order MOM today. Continue Senna S 2 tabs at bedtime. Check magnesium level tomorrow.  ?--May need Senna S increased to bid ?10. Neurogenic bladder: Additional dose today and then increase Flomax to 0.8 mg/hs.  ?            --will monitor voiding with bladder scan/PVR checks.  ?11. Acute blood loss anemia: Recheck CBC in am.   ?12. Vitamin D deficiency: start ergocalciferol 50,000U once per week for 7 weeks. ?13. Hypoalbuminemia: educated regarding importance of low added sugar/high protein diet. Discussed that nuts are a great source of protein ?14. Transaminitis: discussed repeating LFTs prior to discharge.  ?15. Obesity BMI 39.68: provided list of foods that can assist with weight loss ?  ? ?LOS: ?2 days ?A FACE TO FACE EVALUATION WAS PERFORMED ? ?Cristian Grant ?09/17/2021, 7:09 AM  ? ?  ?

## 2021-09-17 NOTE — Progress Notes (Signed)
Physical Therapy Session Note ? ?Patient Details  ?Name: Cristian Grant ?MRN: [id number] ?Date of Birth: 10-29-1985 ? ?Today's Date: 09/17/2021 ?PT Individual Time: [id number] and [id number] ?PT Individual Time Calculation (min): 55 min and 55 min ? ?Short Term Goals: ?Week 1:  PT Short Term Goal 1 (Week 1): STG=LTG due to LOS ? ?Skilled Therapeutic Interventions/Progress Updates:  ? Treatment Session 1 ?Received pt semi-reclined in bed, pt agreeable to PT treatment, and denied any back pain at rest. Session with emphasis on functional mobility/transfers, generalized strengthening and endurance, dynamic standing balance/coordination, NMR, and gait training. Pt transferred supine<>sitting EOB from flat bed without bedrails and mod I using logroll technique. Donned shoes with max A to adhere to back precautions. Pt performed all transfers with [facility] and CGA throughout session. Pt ambulated to/from sink with SPC and CGA and stood and brushed teeth/washed face with supervision with 1 UE support on sink and no LOB noted. IV team present to inspect line - transferred sit<>supine mod I for RN to access line. Returned to sitting EOB and pt ambulated 142ft x 2 trials with SPC and CGA to/from main therapy gym - noted improvements in stride length and gait speed. Pt performed the following activities on Biodex with emphasis on weight shifting, LE sensory awareness, proprioception, and dynamic standing balance: ?-weight shift training on level static for 1 minute with no UE support ?-weight shift training on level 10 for 1 minute and 15 seconds with occasional light BUE support for stability ?-limits of stability training on level static for 1 minute with no UE support ?-limits of stability training on level 10 for 1 minute with BUE support for stability ?Worked on lateral step ups onto 6in step 2x15 bilaterally with 1-2 UE support with emphasis on hip abductor strengthening - noted mild R>L hyperextension but pt able to  control when using mirror for biofeedback. Returned to room and concluded session with pt sitting in [facility] with all needs within reach - updated safety plan and RN on pt's current mobility status.  ? ?Treatment Session 2 ?Received pt sitting in WC playing scrabble with mother. Pt agreeable to PT treatment and denied any pain at rest but reported increased sharp pain up to 4/10 along incision with certain exercises. Session with emphasis on functional mobility/transfers, generalized strengthening and endurance, dynamic standing balance/coordination, NMR, and gait training. Pt performed all transfers with [facility] and CGA throughout session. Pt ambulated 177ft with SPC and supervision to main therapy gym. Pt performed the following activities inside // bars with emphasis on dynamic standing balance/coordination: ?-Static standing balance on Rockerboard without UE support for 1 minute ?-Lateral taps on Rockerboard without UE support but hovering above bars 2x20 ?-Mini squats on Rockerboard without UE support 2x15  ?-Lunges on Bosu 2x10 reps with min A for balance initially without UE support transitioning to BUE support ?-Lateral stepping with grn TB x8 laps with emphasis on hip abductor strengthening ?Attempted sit<>stands on Airex x4 reps starting with 6.6lb medicine ball transitioning to no weight - stopped due to increased back pain. Pt then performed standing lateral flexion 2x12 bilaterally with 9lb dumbbell with emphasis on oblique strengthening - then performed seated hamstring stretch 2x20 seconds bilaterally. Pt ambulated >243ft without AD and close supervision back to room with 1 mild LOB when turning but pt able to self-correct. Pt ambulated into bathroom without AD and supervision. Pt left sitting on commode and agreed to pull cord for nursing when done - notified primary RN and NT of  pt's current status.  ? ?Therapy Documentation ?Precautions:  ?Precautions ?Precautions: Fall ?Restrictions ?Weight Bearing  Restrictions: No ?Other Position/Activity Restrictions: no brace needed ? ?Therapy/Group: Individual Therapy ?Blenda Nicely ?Becky Sax PT, DPT  ?09/17/2021, 7:23 AM  ?

## 2021-09-17 NOTE — Progress Notes (Signed)
While getting 1800 dose of ancef pt called stating his IV line began to leak, and pt felt burning sensation. IV site leaking fluid, IV removed from left FARM. Upon assessment after removal there was no swelling, or redness. Site is clean dry and intact. Spoke with Pharmacy regarding dose of Ancef per pharmacy ok to continue with regularly scheduled dosing at 0200. Pt received half dose of medication for 1800.  ? ?Dayna Ramus ? ? ?

## 2021-09-17 NOTE — Progress Notes (Signed)
Peripherally Inserted Central Catheter Placement ? ?The IV Nurse has discussed with the patient and/or persons authorized to consent for the patient, the purpose of this procedure and the potential benefits and risks involved with this procedure.  The benefits include less needle sticks, lab draws from the catheter, and the patient may be discharged home with the catheter. Risks include, but not limited to, infection, bleeding, blood clot (thrombus formation), and puncture of an artery; nerve damage and irregular heartbeat and possibility to perform a PICC exchange if needed/ordered by physician.  Alternatives to this procedure were also discussed.  Bard Power PICC patient education guide, fact sheet on infection prevention and patient information card has been provided to patient /or left at bedside.   ? ?PICC Placement Documentation  ?PICC Single Lumen 09/17/21 Right Cephalic 43 cm 0 cm (Active)  ?Indication for Insertion or Continuance of Line Prolonged intravenous therapies 09/17/21 1842  ?Exposed Catheter (cm) 0 cm 09/17/21 1842  ?Site Assessment Clean, Dry, Intact 09/17/21 1842  ?Line Status Flushed;Blood return noted;Saline locked 09/17/21 1842  ?Dressing Type Securing device 09/17/21 1842  ?Dressing Status Antimicrobial disc in place 09/17/21 1842  ?Dressing Intervention New dressing 09/17/21 1842  ?Dressing Change Due 09/24/21 09/17/21 1842  ? ? ? ? ? ?Conrad Horicon Ramos ?09/17/2021, 6:46 PM ? ?

## 2021-09-17 NOTE — Progress Notes (Signed)
Physical Therapy Session Note ? ?Patient Details  ?Name: Cristian Grant ?MRN: [id number] ?Date of Birth: 1986-03-15 ? ?Today's Date: 09/17/2021 ?PT Individual Time: 0935-1000 ?PT Individual Time Calculation (min): 25 min  ? ?Short Term Goals: ?Week 1:  PT Short Term Goal 1 (Week 1): STG=LTG due to LOS ? ?Skilled Therapeutic Interventions/Progress Updates:  ?  Pt received seated in w/c in room, agreeable to PT session. No complaints of pain. Pt performs transfers and gait at Supervision level throughout session with use of SPC. with SPC and Supervision x 925 ft with no rest breaks. Educated pt on purpose of test for endurance and that he will be tested again prior to d/c. Pt reports ongoing numbness in BLE, R>L. Pt exhibits overall good gait mechanics with no hyperextension of knees and good LE clearance. Pt does report some "twisting" in his R hip during gait and points to gluteal region. Demonstrated how to perform seated piriformis stretch, pt able to perform stretch 3 x 60 sec B and reports feeling the stretch targeting region of tightness especially on R side. Pt left seated in w/c in room with needs in reach at end of session. ? ?Therapy Documentation ?Precautions:  ?Precautions ?Precautions: Fall ?Restrictions ?Weight Bearing Restrictions: No ?Other Position/Activity Restrictions: no brace needed ? ? ? ? ? ? ?Therapy/Group: Individual Therapy ? ? ?Peter Congo, PT, DPT, CSRS ? ?09/17/2021, 11:57 AM  ?

## 2021-09-17 NOTE — Discharge Instructions (Addendum)
Inpatient Rehab Discharge Instructions ? ?Cristian Grant ?Discharge date and time:  09/18/21 ? ?Activities/Precautions/ Functional Status: ?Activity: no lifting, driving, or strenuous exercise till cleared by MD ?Diet: regular diet ?Wound Care: keep wound clean and dry. Contact Dr. Jake Samples if you develop any problems with your incision/wound--redness, swelling, increase in pain, drainage or if you develop fever or chills.   ? ? ?Functional status:  ?___ No restrictions     ___ Walk up steps independently ?___ 24/7 supervision/assistance   ___ Walk up steps with assistance ?_X__ Intermittent supervision/assistance  ___ Bathe/dress independently ?_X__ Walk with walker    _X__ Bathe/dress with assistance ?___ Walk Independently    ___ Shower independently ?___ Walk with assistance    ___ Shower with assistance ?_X__ No alcohol     ___ Return to work/school ________ ? ?COMMUNITY REFERRALS UPON DISCHARGE:   ? ?Home Health:   PT     OT     RN      ?               Agency: [facility]  Phone: [phone]  ? ? ?Medical Equipment/Items Ordered: Information systems manager ?                                                Agency/Supplier: [employer]  [phone] ? ?Special Instructions: ? ? ? ?My questions have been answered and I understand these instructions. I will adhere to these goals and the provided educational materials after my discharge from the hospital. ? ?Patient/Caregiver Signature _______________________________ Date __________ ? ?Clinician Signature _______________________________________ Date __________ ? ?Please bring this form and your medication list with you to all your follow-up doctor's appointments.   ?

## 2021-09-17 NOTE — Progress Notes (Signed)
Occupational Therapy Session Note ? ?Patient Details  ?Name: Cristian Grant ?MRN: [id number] ?Date of Birth: 1985-06-22 ? ?Today's Date: 09/17/2021 ?OT Individual Time: 4920-1007 ?OT Individual Time Calculation (min): 30 min  and Today's Date: 09/17/2021 ?OT Missed Time: 15 Minutes ?Missed Time Reason: Unavailable (comment);Other (comment) (pt had just started eating and needed  to finish) ? ? ?Short Term Goals: ?Week 1:  OT Short Term Goal 1 (Week 1): STG= LTG d/t ELOS ? ?Skilled Therapeutic Interventions/Progress Updates:  ?  At start of therapy session, pt had just started eating food his mother had brought him.  Offered to come back later.   ? ?Returned to room to and discussed with pt what his biggest concerns were about going home. He stated getting in and out of shower and the stairs due to limited sensation in feet.  ? ?Brought in shower stall block for pt to practice stepping over.  Pt able to step over but had difficulty with full foot clearance so suggested he take a step over ledge but then slide other foot closer to ledge, to step foot in shower wider before stepping other foot in. This worked very well for him and he was able to repeat 8x with supervision.   ? ?Discussed toileting, pt stated he is not having any difficulty sitting on regular toilet or cleansing but would benefit from some wipes to feel he is well cleansed.     ? ?Provided pt with green level 3 theraband for UE exercises. Pt practiced pull downs, rows. Tricep ext, bicep curls.  He also did some additional hip abduction exercises.   ? ? ?Pt's wife and children had arrived and wife able to observe how well he was stepping over ledge of shower.   ? ?Pt also provided with long sponge. Pt in room with all needs met.  ? ?Therapy Documentation ?Precautions:  ?Precautions ?Precautions: Fall ?Restrictions ?Weight Bearing Restrictions: No ?Other Position/Activity Restrictions: no brace needed ? ?  ?Vital Signs: ?Therapy Vitals ?Temp: 98.6 ?F  (37 ?C) ?Pulse Rate: 92 ?Resp: 14 ?BP: 94/60 ?Patient Position (if appropriate): Lying ?Oxygen Therapy ?SpO2: 95 % ?O2 Device: Room Air ?Pain: ?Pain Assessment ?Pain Scale: 0-10 ?Pain Score: 1  ?Pain Intervention(s): Medication (See eMAR) ?ADL: ?ADL ?Eating: Independent ?Grooming: Supervision/safety ?Where Assessed-Grooming: Standing at sink ?Upper Body Bathing: Supervision/safety ?Where Assessed-Upper Body Bathing: Shower ?Lower Body Bathing: Minimal assistance ?Where Assessed-Lower Body Bathing: Shower ?Upper Body Dressing: Setup ?Where Assessed-Upper Body Dressing: Edge of bed ?Lower Body Dressing: Contact guard ?Where Assessed-Lower Body Dressing: Edge of bed ?Toileting: Moderate assistance ?Where Assessed-Toileting: Toilet ?Toilet Transfer: Minimal assistance ?Toilet Transfer Method: Ambulating ?Walk-In Shower Transfer: Minimal assistance ?Walk-In Shower Transfer Method: Ambulating ?Walk-In Shower Equipment: Radio broadcast assistant ? ? ?Therapy/Group: Individual Therapy ? ?[name] ?09/17/2021, 8:45 AM ?

## 2021-09-17 NOTE — Progress Notes (Signed)
Slept well last night into this morning. Refused CPAP per RT. Pain medications (prn) for breakthrough pain. Independent with mobility. CHG bath done. IV antibiotic therapy continued. Safety maintained. ?

## 2021-09-17 NOTE — Patient Care Conference (Signed)
Inpatient RehabilitationTeam Conference and Plan of Care Update ?Date: 09/16/2021   Time: 11:44 AM  ? ? ?Patient Name: Cristian Grant      ?Medical Record Number: [id number]  ?Date of Birth: 30-Nov-1985 ?Sex: Male         ?Room/Bed: 4M05C/4M05C-01 ?Payor Info: Payor: Pharmacist, hospital / Plan: [employer] PPO / Product Type: *No Product type* /   ? ?Admit Date/Time:  09/15/2021  2:18 PM ? ?Primary Diagnosis:  Thoracic myelopathy ? ?Hospital Problems: Principal Problem: ?  Thoracic myelopathy ? ? ? ?Expected Discharge Date: Expected Discharge Date: 09/22/21 ? ?Team Members Present: ?Physician leading conference: Dr. Sula Soda ?Social Worker Present: Lavera Guise, BSW ?Nurse Present: Chana Bode, RN ?PT Present: Raechel Chute, PT ?OT Present: Other (comment) [facility] Cheyenne Adas, OT) ?SLP Present: Other (comment) Sarita Bottom, SLP) ?PPS Coordinator present : Fae Pippin, SLP ? ?   Current Status/Progress Goal Weekly Team Focus  ?Bowel/Bladder ? ? Continent of B/B. LBM 09/15/21  Maintain Continence.  Assess and Help toilet as needed.   ?Swallow/Nutrition/ Hydration ? ?           ?ADL's ? ? (S) UB ADLs, min A LB ADLs, needs assist with socks/shoes. CGA-min A ADL transfers, very motivated and hard worker  supervision to mod I  Dynamic balance, LB ADLs, d/c planning ,family edu   ?Mobility ? ? transfers without AD min A, gait 26ft without AD min A, 12 steps with R handrail min A  Mod I  functional mobility/transfers, generalized strengthening, dynamic standing balance/coordination, gait training, D/C planning, and proprioception.   ?Communication ? ?           ?Safety/Cognition/ Behavioral Observations ?           ?Pain ? ? Compains of back pain. Managed with Prescribed Medications.  Pain goal <3/10. Assess Q4hrs and prn      ?Skin ? ? Posterior surgical incision to spine.  Keep free of infection. Promote wound healing.  No new skin breakdown. Assess Q shift and prn.   ? ? ?Discharge Planning:  ?     ?Team Discussion: ?Patient post epidural abscess with lack of sensation in bladder; feels urge to void but cannot sense when he is finished. Interested in weight management tips/dietary modification/exercises within back limitations.  ?Patient on target to meet rehab goals: ?yes, currently needs supervision for upper body care and min assist for lower body care. Completes CGA - min assist for transfers. Goals for discharge set for supervision - Mod I assist. ? ?*See Care Plan and progress notes for long and short-term goals.  ? ?Revisions to Treatment Plan:  ?N/A ?  ?Teaching Needs: ?Safety, back precautions,skin care, medications, transfers, etc  ?Current Barriers to Discharge: ?Decreased caregiver support and IV abx through mid May, 2023 ? ?Possible Resolutions to Barriers: ?Family education ?  ? ? Medical Summary ?Current Status: epidural abscess, decreased sensation, morbid obesity, neurogenic bowel and bladder ? Barriers to Discharge: Medical stability;Neurogenic Bowel & Bladder;Weight ? Barriers to Discharge Comments: epidural abscess, decreased sensation, morbid obesity, neurogenic bowel and bladder ?Possible Resolutions to [employer]: discussed exercises he can do at home, provided dietary counseling, provide education regarding bowel and bladder ? ? ?Continued Need for Acute Rehabilitation Level of Care: The patient requires daily medical management by a physician with specialized training in physical medicine and rehabilitation for the following reasons: ?Direction of a multidisciplinary physical rehabilitation program to maximize functional independence : Yes ?Medical management of patient  stability for increased activity during participation in an intensive rehabilitation regime.: Yes ?Analysis of laboratory values and/or radiology reports with any subsequent need for medication adjustment and/or medical intervention. : Yes ? ? ?I attest that I was present, lead the team conference, and  concur with the assessment and plan of the team. ? ? ?Chana Bode B ?09/17/2021, 8:54 AM  ? ? ? ? ? ? ?

## 2021-09-17 NOTE — Progress Notes (Signed)
Inpatient Rehabilitation  Patient information reviewed and entered into eRehab system by Noella Kipnis M. Sumedha Munnerlyn, M.A., CCC/SLP, PPS Coordinator.  Information including medical coding, functional ability and quality indicators will be reviewed and updated through discharge.    

## 2021-09-18 DIAGNOSIS — D62 Acute posthemorrhagic anemia: Secondary | ICD-10-CM

## 2021-09-18 DIAGNOSIS — E871 Hypo-osmolality and hyponatremia: Secondary | ICD-10-CM

## 2021-09-18 LAB — COMPREHENSIVE METABOLIC PANEL
ALT: 35 U/L (ref 0–44)
AST: 27 U/L (ref 15–41)
Albumin: 3 g/dL — ABNORMAL LOW (ref 3.5–5.0)
Alkaline Phosphatase: 72 U/L (ref 38–126)
Anion gap: 5 (ref 5–15)
BUN: 11 mg/dL (ref 6–20)
CO2: 25 mmol/L (ref 22–32)
Calcium: 8.5 mg/dL — ABNORMAL LOW (ref 8.9–10.3)
Chloride: 103 mmol/L (ref 98–111)
Creatinine, Ser: 0.84 mg/dL (ref 0.61–1.24)
GFR, Estimated: >60 mL/min (ref >60)
Glucose, Bld: 126 mg/dL — ABNORMAL HIGH (ref 70–99)
Potassium: 3.9 mmol/L (ref 3.5–5.1)
Sodium: 133 mmol/L — ABNORMAL LOW (ref 135–145)
Total Bilirubin: 0.5 mg/dL (ref 0.3–1.2)
Total Protein: 7.2 g/dL (ref 6.5–8.1)

## 2021-09-18 MED ORDER — VITAMIN D (ERGOCALCIFEROL) 1.25 MG (50000 UNIT) PO CAPS: 50000.0000 [IU] | ORAL_CAPSULE | ORAL | 0 refills | Status: AC

## 2021-09-18 MED ORDER — ADULT MULTIVITAMIN W/MINERALS CH: 1.0000 | ORAL_TABLET | Freq: Every day | ORAL | 0 refills | Status: AC

## 2021-09-18 MED ORDER — MELATONIN 5 MG PO TABS: 5.0000 mg | ORAL_TABLET | Freq: Every evening | ORAL | 0 refills | Status: DC | PRN

## 2021-09-18 MED ORDER — METHOCARBAMOL 500 MG PO TABS: 500.0000 mg | ORAL_TABLET | Freq: Four times a day (QID) | ORAL | 0 refills | Status: DC | PRN

## 2021-09-18 MED ORDER — OXYCODONE HCL 10 MG PO TABS: 5.0000 mg | ORAL_TABLET | Freq: Four times a day (QID) | ORAL | 0 refills | Status: DC | PRN

## 2021-09-18 MED ORDER — GUAIFENESIN ER 600 MG PO TB12: 1200.0000 mg | ORAL_TABLET | Freq: Two times a day (BID) | ORAL | 0 refills | Status: DC | PRN

## 2021-09-18 MED ORDER — SENNOSIDES-DOCUSATE SODIUM 8.6-50 MG PO TABS: 2.0000 | ORAL_TABLET | Freq: Every day | ORAL | 0 refills | Status: DC

## 2021-09-18 MED ORDER — ROSUVASTATIN CALCIUM 10 MG PO TABS
10.0000 mg | ORAL_TABLET | Freq: Every day | ORAL | 0 refills | Status: AC
Start: 2021-09-18 — End: ?

## 2021-09-18 MED ORDER — TAMSULOSIN HCL 0.4 MG PO CAPS: 0.8000 mg | ORAL_CAPSULE | Freq: Every day | ORAL | 0 refills | Status: DC

## 2021-09-18 MED ORDER — ACETAMINOPHEN 325 MG PO TABS
650.0000 mg | ORAL_TABLET | Freq: Three times a day (TID) | ORAL | 0 refills | Status: AC
Start: 2021-09-18 — End: ?

## 2021-09-18 NOTE — Progress Notes (Signed)
Patient ID: Cristian Grant, male   DOB: 25-Sep-1985, 36 y.o.   MRN: [id number] ? ?Patient RN IV ABV referral sent to [facility].  ?

## 2021-09-18 NOTE — Progress Notes (Signed)
Occupational Therapy Session Note ? ?Patient Details  ?Name: Cristian Grant ?MRN: [id number] ?Date of Birth: [birth year] ? ?Today's Date: 09/18/2021 ?OT Individual Time: 5053-9767 ?OT Individual Time Calculation (min): 56 min  ? ? ?Short Term Goals: ?Week 1:  OT Short Term Goal 1 (Week 1): STG= LTG d/t ELOS ? ?Skilled Therapeutic Interventions/Progress Updates:  ?Skilled OT intervention completed with focus on ADL retraining, functional endurance, d/c planning. Pt received seated in w/c, agreeable to session. Completed all sit > stand and ambulatory transfers with supervision without AD. Ambulated to tub bench in bathroom, with pt doffing shirt in standing, then side step into shower with use of grab bar, doffed pants in stance all with supervision. Therapist covered PICC line and thoracic incision prior to getting wet. Educated pt on how to cover PICC line at home 2/2 to plan for going home with routine antibiotic via peripheral line for 6 weeks. Seated on TTB pt completed all bathing with supervision with use of LH sponge for LB. Donned shirt independently, dried buttocks in standing and pt preferring to walk towards EOB vs tub bench to donn pants and was able with supervision, however educated pt on increased safety risk of falling if feet wet and suggested drying prior to ambulating. Donned pants with supervision at the sit <> stand level, donned socks with supervision via figure 4 position. Discussed with pt d/c plans regarding no OT f/u, and shower chair with adapt delivering to room during discussion and therapist educating pt about assembly, as well as safety set up precautions for height and shower use when setting up at home as well as shower entry and exiting suggestions. Pt was left seated in w/c, with chair alarm on and all needs in reach at end of session. ? ?Therapy Documentation ?Precautions:  ?Precautions ?Precautions: Fall ?Precaution Comments: back precautions ?Restrictions ?Weight Bearing  Restrictions: No ?Other Position/Activity Restrictions: no brace needed ? ?Pain: ?Slight discomfort in upper back, declined intervention, improved with warmth of shower ? ? ?Therapy/Group: Individual Therapy ? ?Kestrel Mis E Tymika Grilli ?09/18/2021, 7:50 AM ?

## 2021-09-18 NOTE — Progress Notes (Addendum)
Inpatient Rehabilitation Discharge Medication Review by a Pharmacist ? ?A complete drug regimen review was completed for this patient to identify any potential clinically significant medication issues. ? ?High Risk Drug Classes Is patient taking? Indication by Medication  ?Antipsychotic No   ?Anticoagulant No   ?Antibiotic Yes Ancef - epidural abscess (end date 10/16/21)  ?Opioid Yes OxyIR - spinal pain  ?Antiplatelet No   ?Hypoglycemics/insulin No   ?Vasoactive Medication No   ?Chemotherapy No   ?Other Yes Crestor - HLD ?Flomax - neurogenic bladder ?Melatonin - sleep ?Robaxin - muscle spasms ?Drisdol - vit D deficiency  ? ? ? ?Type of Medication Issue Identified Description of Issue Recommendation(s)  ?Drug Interaction(s) (clinically significant) ?    ?Duplicate Therapy ?    ?Allergy ?    ?No Medication Administration End Date ?    ?Incorrect Dose ?    ?Additional Drug Therapy Needed ?    ?Significant med changes from prior encounter (inform family/care partners about these prior to discharge).    ?Other ?    ? ? ?Clinically significant medication issues were identified that warrant physician communication and completion of prescribed/recommended actions by midnight of the next day:  No ? ?Time spent performing this drug regimen review (minutes):  30 ? ? ?Dani Anastasov ?09/18/2021 1:25 PM ? ?Agree with the contents of this note ? ?Lacey Dotson BS, PharmD, BCPS ?Clinical Pharmacist ?09/18/2021 1:34 PM ? ?Contact: [phone] after 3 PM ? ?"Be curious, not judgmental..." -Debbora Dus ?

## 2021-09-18 NOTE — IPOC Note (Signed)
Overall Plan of Care (IPOC) ?Patient Details ?Name: Cristian Grant ?MRN: [id number] ?DOB: 23-Jan-1986 ? ?Admitting Diagnosis: Thoracic myelopathy ? ?Hospital Problems: Principal Problem: ?  Thoracic myelopathy ?Active Problems: ?  Morbid obesity (HCC) ?  Epidural abscess ?  Acute blood loss anemia ?  Hyponatremia ? ? ? ? Functional Problem List: ?Nursing Safety, Bladder, Bowel, Pain, Endurance, Skin Integrity, Medication Management  ?PT Balance, Endurance, Motor, Pain, Perception, Sensory, Skin Integrity  ?OT Balance, Sensory, Skin Integrity, Endurance, Motor, Pain  ?SLP    ?TR    ?    ? Basic ADL?s: ?OT Bathing, Dressing, Toileting  ? ?  Advanced  ADL?s: ?OT Simple Meal Preparation  ?   ?Transfers: ?PT Bed Mobility, Bed to Chair, Car, Furniture  ?OT Toilet, Tub/Shower  ? ?  Locomotion: ?PT Ambulation, Wheelchair Mobility, Stairs  ? ?  Additional Impairments: ?OT None  ?SLP   ?  ?   ?TR    ? ? ?Anticipated Outcomes ?Item Anticipated Outcome  ?Self Feeding no goal set  ?Swallowing ?   ?  ?Basic self-care ? mod I  ?Toileting ? mod I ?  ?Bathroom Transfers (S)  ?Bowel/Bladder ? manage bowel w min assist and bladder min assist  ?Transfers ? Mod I  ?Locomotion ? Mod I  ?Communication ?    ?Cognition ?    ?Pain ? pain at or below level 4 with prns  ?Safety/Judgment ? maintain safety with cues  ? ?Therapy Plan: ?PT Intensity: Minimum of 1-2 x/day ,45 to 90 minutes ?PT Frequency: 5 out of 7 days ?PT Duration Estimated Length of Stay: about 5 ?OT Intensity: Minimum of 1-2 x/day, 45 to 90 minutes ?OT Frequency: 5 out of 7 days ?OT Duration/Estimated Length of Stay: 5-7 days ?   ? ?Due to the current state of emergency, patients may not be receiving their 3-hours of Medicare-mandated therapy. ? ? Team Interventions: ?Nursing Interventions Disease Management/Prevention, Bladder Management, Medication Management, Discharge Planning, Pain Management, Bowel Management, Patient/Family Education, Skin Care/Wound Management   ?PT interventions Ambulation/gait training, Discharge planning, Functional mobility training, Psychosocial support, Therapeutic Activities, Balance/vestibular training, Disease management/prevention, Neuromuscular re-education, Skin care/wound management, Therapeutic Exercise, Wheelchair propulsion/positioning, DME/adaptive equipment instruction, Pain management, Splinting/orthotics, UE/LE Strength taining/ROM, Community reintegration, Equities trader education, Museum/gallery curator, UE/LE Coordination activities  ?OT Interventions Balance/vestibular training, Discharge planning, Pain management, Self Care/advanced ADL retraining, Therapeutic Activities, Therapeutic Exercise, Skin care/wound managment, Patient/family education, Functional mobility training, Community reintegration, DME/adaptive equipment instruction, Psychosocial support  ?SLP Interventions    ?TR Interventions    ?SW/CM Interventions Discharge Planning, Psychosocial Support, Patient/Family Education, Disease Management/Prevention  ? ?Barriers to Discharge ?MD  Medical stability  ?Nursing Decreased caregiver support, Home environment access/layout ?2 level main B/B w spouse; worked PTA as band Secondary school teacher  ?PT Home environment access/layout, Neurogenic Bowel & Bladder, Wound Care ?flight of steps to get to bedroom, altered sensations regarding bowel/bladder management, and surgical incision to back  ?OT   ?   ?SLP   ?   ?SW Insurance for SNF coverage, Decreased caregiver support, IV antibiotics ?   ? ?Team Discharge Planning: ?Destination: PT-Home ,OT- Home , SLP-  ?Projected Follow-up: PT-Outpatient PT, OT-  Outpatient OT, SLP-  ?Projected Equipment Needs: PT-To be determined, OT- To be determined, SLP-  ?Equipment Details: PT-has none, OT-  ?Patient/family involved in discharge planning: PT- Patient,  OT-Patient, SLP-  ? ?MD ELOS: 5 days ?Medical Rehab Prognosis:  Excellent ?Assessment: The patient has been admitted for CIR therapies with the diagnosis  of thoracic myelopathy. The team will be addressing functional mobility, strength, stamina, balance, safety, adaptive techniques and equipment, self-care, bowel and bladder mgt, patient and caregiver education. Goals have been set at modI. Anticipated discharge destination is home. ? ?See Team Conference Notes for weekly updates to the plan of care  ?

## 2021-09-18 NOTE — Discharge Summary (Signed)
Physician Discharge Summary  ?Patient ID: ?Rickard Rhymes ?MRN: [id number] ?DOB/AGE: 1986/05/02 36 y.o. ? ?Admit date: 09/15/2021 ?Discharge date: 09/20/2021 ? ?Discharge Diagnoses:  ?Principal Problem: ?  Thoracic myelopathy ?Active Problems: ?  Morbid obesity ([place]) ?  Epidural abscess ?  Acute blood loss anemia ?  Hyponatremia ?  Postoperative pain ? ? ?Discharged Condition: stable ? ?Significant Diagnostic Studies: ? ?[place] EKG Site Rite ? ?Result Date: 09/17/2021 ?If [employer] not attached, placement could not be confirmed due to current cardiac rhythm. ? ? ?Labs:  ?Basic Metabolic Panel: ?Recent Labs  ?Lab 09/14/21 ?0735 09/16/21 ?0436 09/18/21 ?0406  ?NA 135 135 133*  ?K 4.2 4.4 3.9  ?CL 103 104 103  ?CO2 _0 ?GLUCOSE 99 98 126*  ?BUN _1 ?CREATININE 0.88 0.91 0.84  ?CALCIUM 9.2 9.0 8.5*  ?MG  --  2.2  --   ? ? ?CBC: ? ?  Latest Ref Rng & Units 09/16/2021  ?  4:36 AM 09/14/2021  ?  7:35 AM 09/09/2021  ?  5:23 AM  ?CBC  ?WBC 4.0 - 10.5 K/uL 8.6   8.3   12.4    ?Hemoglobin 13.0 - 17.0 g/dL 11.8   12.2   11.9    ?Hematocrit 39.0 - 52.0 % 36.0   37.9   37.8    ?Platelets 150 - 400 K/uL 406   419   456    ?  ? ?CBG: ?No results for input(s): GLUCAP in the last 168 hours. ? ?Brief HPI:   Goerge Mohr is a 36 y.o. male in relatively good health who started developing progressive back pain 24 hours after lifting heavy item at work.  This was followed by a fall with progressive BLE weakness and he was admitted via ED on 03/31 with chest tightness/pain, numbness from abdomen down and inability to walk.  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  He was taken to the OR for bilateral T5-T7 laminectomy of T6/T7 medial facetectomy by Dr. [name] on the same day.  Small  amount of pus was encountered concerning for epidural abscess and culture was positive for rare Staph aureus. ? ?Postop, he had improvement in sensorimotor return and ID was consulted for input.  TEE done was negative for endocarditis and Dr. [name] recommended IV cefazolin x6 weeks with end date of 10/16/2021 as well as recommendations to check elevated 58./CRP every 14 days.  Foley was placed for neurogenic bladder but DC'd on 04/07 with patient reporting difficulty in voiding as well as issues with constipation.  He continues to be limited by lower extremity weakness with sensory deficits affecting mobility and ADLs.  CIR was recommended for follow-up therapy. ? ? ?Hospital Course: Ramzey Petrovic was admitted to rehab 09/15/2021 for inpatient therapies to consist of PT and OT at least three hours five days a week. Past admission physiatrist, therapy team and rehab RN have worked together to provide customized collaborative inpatient rehab.  He was maintained on subcu Lovenox for DVT prophylaxis and was started on aggressive bowel program to help manage constipation.  Flomax was increased to 0.8 mg at bedtime and bladder functioning was monitored with PVR checks.  With resolution of constipation, hesitancy has resolved and he is voiding without difficulty.Blood pressures were monitored on  TID basis and have been stable. Pain is relatively controlled with as needed use of oxycodone.  He was advised on importance of tapering narcotics after discharge.  ? ?He was noted to have vitamin D deficiency and was started on ergocalciferol 50,000 units weekly.  He was educated on importance of high-protein diet to help promote wound healing.  He was noted to have abnormal LFTs with elevation in ALT but repeat labs showed resolution.  Check of electrolytes shows recurrent mild hyponatremia but renal status within normal limits.  Follow-up CBC shows H&H to be relatively stable and leukocytosis has resolved.  CRP was  noted on downward trend but sed rate elevated at 58.  He has been afebrile during his stay and wound is noted to be C/D/I and healing well without signs or symptoms of infection.  He has made good gains during his rehab stay and was modified independent in supervised setting.  He will continue to receive follow-up HH therapy by  [facility] after discharge.  ? ? ? ?Rehab course: During patient's stay in rehab team conference was held to monitor patient's progress, set goals and discuss barriers to discharge. At admission, patient required min assist with basic ADL tasks and with mobility. He has had improvement in activity tolerance, balance, postural control as well as ability to compensate for deficits. He is able to complete ADL tasks at modified independent level. He is modified independent for transfers and to ambulate ? ? ?Disposition: Home  ? ?Diet: Regular.  ? ?Special Instructions: ?ID appointment with Dr. [name] 10/07/21. ?No driving, lifting items over 5 lbs or strenuous activity.  ? ?Discharge Instructions   ? ? [facility] pharmacist to adjust dose for Vancomycin, Aminoglycosides and other anti-infective therapies as requested by physician.   Complete by: As directed ?  ? [facility] to provide Cath Flo 20m   Complete by: As directed ?  ? Administer for PICC line occlusion and as ordered by physician for other access device issues.  ? Anaphylaxis Kit: Provided to treat any anaphylactic reaction to the medication being provided to the patient if First Dose or when requested by physician   Complete by: As directed ?  ? Epinephrine 141mml vial / amp: Administer 0.40m39m0.40ml25mubcutaneously once for moderate to severe anaphylaxis, nurse to call physician and pharmacy when reaction occurs and call 911 if needed for immediate care  ? Diphenhydramine 50mg36mIV vial: Administer 25-50mg 78mM PRN for first dose reaction, rash, itching, mild reaction, nurse to call physician and  pharmacy when reaction occurs  ? Sodium Chloride 0.9% NS 500ml I4mdminister if needed for hypovolemic blood pressure drop or as ordered by physician after call to physician with anaphylactic reaction  ? Change dressing on IV access line weekly and PRN   Complete by: As directed ?  ? Flush IV access with Sodium Chloride 0.9% and Heparin 10 units/ml or 100 units/ml   Complete by: As directed ?  ? Home infusion instructions - [facility]   Complete by: As directed ?  ? Instructions: Flush IV access with Sodium Chloride 0.9% and Heparin 10units/ml or 100units/ml  ? Change dressing on IV access line: Weekly and PRN  ? Instructions Cath Flo 2mg: Ad20mister for PICC Line occlusion and as ordered by physician for other access device  ? [facility] pharmacist to adjust dose for: Vancomycin, Aminoglycosides and other anti-infective therapies as requested by physician  ? Method of administration may be changed  at the discretion of home infusion pharmacist based upon assessment of the patient and/or caregiver?s ability to self-administer the medication ordered   Complete by: As directed ?  ? ?  ? ?Allergies as of 09/20/2021   ?No Known Allergies ?  ? ?  ?Medication List  ?  ? ?STOP taking these medications   ? ?hydrOXYzine 25 MG tablet ?Commonly known as: ATARAX ?  ?naproxen 500 MG tablet ?Commonly known as: NAPROSYN ?  ? ?  ? ?TAKE these medications   ? ?acetaminophen 325 MG tablet ?Commonly known as: TYLENOL ?Take 2 tablets (650 mg total) by mouth 3 (three) times daily. ?Notes to patient: Wean to as needed ?  ?ceFAZolin  IVPB ?Commonly known as: ANCEF ?Inject 2 g into the vein every 8 (eight) hours. Indication: MSSA epidural abscess ?First Dose: Yes ?Last Day of Therapy:  10/16/21 ?Labs - Once weekly:  CBC/D and BMP, ?Labs - Every other week:  ESR and CRP ?Pull PICC at the completion of IV therapy ?Method of administration: IV Push ?Method of administration may be changed at the discretion of home  infusion pharmacist based upon assessment of the patient and/or caregiver's ability to self-administer the medication ordered. ?  ?guaiFENesin 600 MG 12 hr tablet ?Commonly known as: [place] ?Take 2 tablets (1,200

## 2021-09-18 NOTE — Progress Notes (Signed)
Patient ID: Cristian Grant, male   DOB: Jul 03, 1985, 36 y.o.   MRN: [id number] ? ?Shower Chair ordered through [employer]  ?

## 2021-09-18 NOTE — Progress Notes (Signed)
Inpatient Rehabilitation Care Coordinator ?Discharge Note  ? ?Patient Details  ?Name: Cristian Grant ?MRN: [id number] ?Date of Birth: 10/01/1985 ? ? ?Discharge location: Home ? ?Length of Stay: 5 Day ? ?Discharge activity level: Sup ? ?Home/community participation: spouse and mother ? ?Patient response RS:WNIOEV Literacy - How often do you need to have someone help you when you read instructions, pamphlets, or other written material from your doctor or pharmacy?: Never ? ?Patient response OJ:JKKXFG Isolation - How often do you feel lonely or isolated from those around you?: Never ? ?Services provided included: SW, Pharmacy, TR, CM, RN, SLP, OT, PT, RD, MD ? ?Financial Services:  ?Field seismologist Utilized: Private Insurance ?BCBS ? ?Choices offered to/list presented to: patient ? ?Follow-up services arranged:  ?Home Health ?[facility] Agency: [facility]  ?  ?  ?  ? ?Patient response to transportation need: ?Is the patient able to respond to transportation needs?: Yes ?In the past 12 months, has lack of transportation kept you from medical appointments or from getting medications?: No ?In the past 12 months, has lack of transportation kept you from meetings, work, or from getting things needed for daily living?: No ? ? ? ?Comments (or additional information): ? ?Patient/Family verbalized understanding of follow-up arrangements:  Yes ? ?Individual responsible for coordination of the follow-up plan: patient ? ?Confirmed correct DME delivered: Andria Rhein 09/18/2021   ? ?Andria Rhein ?

## 2021-09-18 NOTE — Progress Notes (Signed)
?                                                       PROGRESS NOTE ? ? ?Subjective/Complaints: ?Doing great with therapy ?Has no questions or complaints ?Discussed plan for d/c Sunday and he is agreeable ?Discussed we will review medications and follow-ups today ? ?ROS: +spinal pain, denies insomnia ? ? ?Objective: ?  ?[place] EKG Site Rite ? ?Result Date: 09/17/2021 ?If [employer] not attached, placement could not be confirmed due to current cardiac rhythm.  ?Recent Labs  ?  09/16/21 ?0436  ?WBC 8.6  ?HGB 11.8*  ?HCT 36.0*  ?PLT 406*  ? ?Recent Labs  ?  09/16/21 ?0436 09/18/21 ?0406  ?NA 135 133*  ?K 4.4 3.9  ?CL 104 103  ?CO2 24 25  ?GLUCOSE 98 126*  ?BUN 13 11  ?CREATININE 0.91 0.84  ?CALCIUM 9.0 8.5*  ? ? ?Intake/Output Summary (Last 24 hours) at 09/18/2021 1412 ?Last data filed at 09/18/2021 0900 ?Gross per 24 hour  ?Intake 490 ml  ?Output --  ?Net 490 ml  ?  ? ?  ? ?Physical Exam: ?Vital Signs ?Blood pressure 110/66, pulse 97, temperature 98.2 ?F (36.8 ?C), resp. rate 17, height 6\' 2"  (1.88 m), weight (!) 140.2 kg, SpO2 94 %. ? ?Gen: no distress, normal appearing, BMI 39.68 ?HEENT: oral mucosa pink and moist, NCAT ?Cardio: Reg rate ?Chest: normal effort, normal rate of breathing ?Abd: soft, non-distended ?Ext: no edema ?Psych: pleasant, normal affect ?Skin: intact ?Neurological:  ?   Mental Status: He is alert and oriented to person, place, and time. Good historian.  ?   Comments: RLE>LLE weakness with patchy areas of decreased sensation from umbilicus <L2- S2   ?Ambulating S with cane ?Dressing S ? ? ?Assessment/Plan: ?1. Functional deficits which require 3+ hours per day of interdisciplinary therapy in a comprehensive inpatient rehab setting. ?Physiatrist is providing close team supervision and 24 hour management of active medical problems listed below. ?Physiatrist and rehab team continue to assess barriers to discharge/monitor patient progress toward functional and medical goals ? ?Care Tool: ? ?Bathing ?    ?Body parts bathed by patient: Right arm, Left arm, Chest, Abdomen, Front perineal area, Buttocks, Right upper leg, Face, Left upper leg, Right lower leg, Left lower leg  ? Body parts bathed by helper: Right lower leg, Left lower leg ?  ?  ?Bathing assist Assist Level: Supervision/Verbal cueing ?  ?  ?Upper Body Dressing/Undressing ?Upper body dressing   ?What is the patient wearing?: Pull over shirt ?   ?Upper body assist Assist Level: Independent ?   ?Lower Body Dressing/Undressing ?Lower body dressing ? ? ?   ?What is the patient wearing?: Underwear/pull up, Pants ? ?  ? ?Lower body assist Assist for lower body dressing: Supervision/Verbal cueing ?   ? ?Toileting ?Toileting    ?Toileting assist Assist for toileting: Set up assist ?  ?  ?Transfers ?Chair/bed transfer ? ?Transfers assist ?   ? ?Chair/bed transfer assist level: Supervision/Verbal cueing ?  ?  ?Locomotion ?Ambulation ? ? ?Ambulation assist ? ?   ? ?Assist level: Supervision/Verbal cueing ?Assistive device: No Device ?Max distance: 163ft  ? ?Walk 10 feet activity ? ? ?Assist ?   ? ?Assist level: Supervision/Verbal cueing ?Assistive device: No Device  ? ?Walk 50  feet activity ? ? ?Assist   ? ?Assist level: Supervision/Verbal cueing ?Assistive device: No Device  ? ? ?Walk 150 feet activity ? ? ?Assist   ? ?Assist level: Supervision/Verbal cueing ?Assistive device: No Device ?  ? ?Walk 10 feet on uneven surface  ?activity ? ? ?Assist   ? ? ?Assist level: Minimal Assistance - Patient > 75% ?Assistive device: Other (comment) (none)  ? ?Wheelchair ? ? ? ? ?Assist Is the patient using a wheelchair?: Yes ?Type of Wheelchair: Manual ?  ? ?Wheelchair assist level: Dependent - Patient 0% ?Max wheelchair distance: >116ft  ? ? ?Wheelchair 50 feet with 2 turns activity ? ? ? ?Assist ? ?  ?  ? ? ?Assist Level: Dependent - Patient 0%  ? ?Wheelchair 150 feet activity  ? ? ? ?Assist ?   ? ? ?Assist Level: Dependent - Patient 0%  ? ?Blood pressure 110/66, pulse 97,  temperature 98.2 ?F (36.8 ?C), resp. rate 17, height 6\' 2"  (1.88 m), weight (!) 140.2 kg, SpO2 94 %. ? ?Medical Problem List and Plan: ?1. 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            -patient may shower but incision must be covered.  ?            -ELOS/Goals: 5 days ?            -HFU scheduled ? Continue CIR ?2.  Antithrombotics: ?-DVT/anticoagulation:  Pharmaceutical: Lovenox ?            -antiplatelet therapy: N/A ?3. Spinal pain: Schedule Tylenol 650mg  TID. Oxycodone prn effective. Repeat LFTs tomorrow. ?4. Mood: LCSW to follow for evaluation and support.  ?            -antipsychotic agents: N/A ?5. Neuropsych: This patient is capable of making decisions on his own behalf. ?6. Skin/Wound Care: Monitor incision for healing. Routine pressure relief measures ?7. Fluids/Electrolytes/Nutrition: Monitor I/O. Check CMET in am.  ?8. Epidural abscess: Continue Cefazolin  for 6 weeks--end date 10/16/21 ?            --Sed rate/CRP tomorrow then per protocol.  ?9. Constipation: Will order MOM today. Continue Senna S 2 tabs at bedtime. Check magnesium level tomorrow.  ?--May need Senna S increased to bid ?10. Neurogenic bladder: Additional dose today and then increase Flomax to 0.8 mg/hs.  ?            --will monitor voiding with bladder scan/PVR checks.  ?11. Acute blood loss anemia: Hgb stable, repeat outpatient  ?12. Vitamin D deficiency: continue ergocalciferol 50,000U once per week for 7 weeks. ?13. Hypoalbuminemia: educated regarding importance of low added sugar/high protein diet. Discussed that nuts are a great source of protein ?14. Transaminitis: resolved, continue tylenol prn for pain.  ?15. Obesity BMI 39.68: provided list of foods that can assist with weight loss. Discussed benefits of nuts.  ?  ? ?LOS: ?3 days ?A FACE TO FACE EVALUATION WAS PERFORMED ? ? P Hava Massingale ?09/18/2021, 2:12 PM  ? ?  ?

## 2021-09-18 NOTE — Progress Notes (Signed)
Physical Therapy Session Note ? ?Patient Details  ?Name: Cristian Grant ?MRN: [id number] ?Date of Birth: March 12, 1986 ? ?Today's Date: 09/18/2021 ?PT Individual Time: 0160-1093 and 2355-7322 ?PT Individual Time Calculation (min): 57 min and 73 min ? ?Short Term Goals: ?Week 1:  PT Short Term Goal 1 (Week 1): STG=LTG due to LOS ? ?Skilled Therapeutic Interventions/Progress Updates:  ? Treatment Session 1 ?Received pt sitting in WC, pt agreeable to PT treatment, and reported pain 1-2/10 along surgical incision (premedicated). Session with emphasis on functional mobility/transfers, generalized strengthening and endurance, dynamic standing balance/coordination, and gait training. Pt performed all transfers without [facility] and close supervision throughout session. Pt ambulated to sink without AD and supervision and stood and brushed teeth/washed face with distant supervision/mod I. Pt ambulated >364ft without AD and supervision to dayroom (1 mild LOB when turning requiring CGA to correct) and performed BLE strengthening on Nustep at workload 7 for 10 minutes for a total of 663 steps with emphasis on cardiovascular endurance and LE strength/proprioception - steps/min >65. Discussed moving D/C date up to Sunday and discussed with rest of treatment team. Pt ambulated additional 139ft without AD and supervision to main therapy gym and performed modified push ups on raised plinth 2x8 with CGA for balance. Pt then performed modified crunches from wedge 2x8 reps with emphasis on core strength - PA, Pam arrived to discuss discharge plans. Pt performed the following exercises standing without UE support: ?-hip abduction 2x10 and close supervision ?-alternating marches 2x20 with close supervision ?-mini squats with 6.6lb medicine ball and grn TB around thighs 4x10 - mirror for biofeedback ?Pt then performed standing toe taps to 3 cones 2x3 reps bilaterally without UE support with CGA/light min A for balance - MD arrived for  morning rounds. Pt ambulated 150ft without AD and supervision back to room. Concluded session with pt sitting in [facility] with all needs within reach awaiting upcoming OT session. Provided pt with fresh ice water.  ? ?Treatment Session 2 ?Received pt sitting in WC playing scrabble with mother. Pt agreeable to PT treatment and did not c/o pain during session. Session with emphasis on functional mobility/transfers, generalized strengthening and endurance, dynamic standing balance/coordination, and gait training. RN notified IV team to disconnect PICC line. Pt performed all transfers without AD and close supervision throughout session. Pt ambulated 173ft x 2 tirals without AD and supervision to/from main therapy gym holding IV pole and navigated 16 steps with R handrail and close supervision ascending and descending with a step through pattern to simulate home environment. Pt transferred sit<>semi-reclined on wedge (using logroll technique) and performed the following stretches/exercises: ?-hamstring stretch with gait belt 2x20 seconds bilaterally increased tightness L>R and decreased sensation ?-piriformis stretch 2x20 seconds bilaterally  ?-bridges 3x10 ?-LE deadbug leg lowers 2x10 bilaterally from hooklying position (pt unable to tolerate hovering legs in table top position) ?-hookying UE extension into physioball 2x10 with 5 second isometric hold ?-bridges on physioball 2x10 ?Semi-reclined<>sitting EOM mod I and pt stood and worked on Retail buyer ski sliders on towel 1x5 reps bilaterally into hip flexion, abduction, and extension with emphasis on control of stance leg. Pt then ambulated 3,020ft without AD holding onto IV pole with supervision. Pt required a few brief standing rest breaks and 1 longer standing break to perform standing quad stretch (with assist from therapist to stabilize RLE). Pt with occasional unsteadiness when turning and increased bilateral knee hyperextension with fatigue but  pt able to self-correct. Concluded session with pt sitting in WC  with all needs within reach and NT present checking vitals. Provided pt with fresh ice water.  ? ?Therapy Documentation ?Precautions:  ?Precautions ?Precautions: Fall ?Precaution Comments: back precautions ?Restrictions ?Weight Bearing Restrictions: No ?Other Position/Activity Restrictions: no brace needed ? ?Therapy/Group: Individual Therapy ?[name] ?Raechel Chute PT, DPT  ?09/18/2021, 7:14 AM  ?

## 2021-09-19 DIAGNOSIS — G8918 Other acute postprocedural pain: Secondary | ICD-10-CM

## 2021-09-19 DIAGNOSIS — E871 Hypo-osmolality and hyponatremia: Secondary | ICD-10-CM

## 2021-09-19 NOTE — Progress Notes (Signed)
Pt states he doesn't wear CPAP at home and doesn't wish to wear one here at the hospital.  ?

## 2021-09-19 NOTE — Progress Notes (Signed)
Physical Therapy Discharge Summary ? ?Patient Details  ?Name: Cristian Grant ?MRN: [id number] ?Date of Birth: [birth year] ? ?Patient has met 8 of 8 long term goals due to improved activity tolerance, improved balance, improved postural control, increased strength, decreased pain, and improved coordination. Patient to discharge at an ambulatory level Modified Independent. Patient's care partner is independent to provide the necessary physical assistance at discharge. Pt's family did not attend formal family education training, however pt's mother has been present daily and observed pt move around in room.  ? ?All goals met  ? ?Recommendation:  ?Patient will benefit from ongoing skilled PT services in outpatient setting to continue to advance safe functional mobility, address ongoing impairments in generalized strengthening and endurance, dynamic standing balance/coordination, gait training, NMR, and to minimize fall risk. ? ?Equipment: ?No equipment provided ? ?Reasons for discharge: treatment goals met ? ?Patient/family agrees with progress made and goals achieved: Yes ? ?PT Discharge ?Precautions/Restrictions ?Precautions ?Precautions: Fall ?Precaution Comments: back precautions ?Restrictions ?Weight Bearing Restrictions: No ?Other Position/Activity Restrictions: no brace needed ?Pain Interference ?Pain Interference ?Pain Effect on Sleep: 2. Occasionally ?Pain Interference with Therapy Activities: 0. Does not apply - I have not received rehabilitationtherapy in the past 5 days ?Pain Interference with Day-to-Day Activities: 1. Rarely or not at all ?Cognition ?Overall Cognitive Status: Within Functional Limits for tasks assessed ?Arousal/Alertness: Awake/alert ?Orientation Level: Oriented X4 ?Memory: Appears intact ?Awareness: Appears intact ?Problem Solving: Appears intact ?Safety/Judgment: Appears intact ?Sensation ?Sensation ?Light Touch: Appears Intact ?Proprioception: Appears Intact ?Additional Comments:  BUE intact, however reports numbness on RLE and hypersensitivity on LLE ?Coordination ?Gross Motor Movements are Fluid and Coordinated: Yes ?Fine Motor Movements are Fluid and Coordinated: Yes ?Coordination and Movement Description: slight uncoordination due to impaired sensation in BLE affecting balance/proprioception ?Finger Nose Finger Test: [facility] bilaterally ?Heel Shin Test: [facility] bilaterally ?Motor  ?Motor ?Motor: Other (comment) ?Motor - Skilled Clinical Observations: slight uncoordination due to impaired sensation in BLE affecting balance/proprioception  ?Mobility ?Bed Mobility ?Bed Mobility: Rolling Right;Rolling Left;Sit to Supine;Supine to Sit ?Rolling Right: Independent with assistive device ?Rolling Left: Independent with assistive device ?Supine to Sit: Independent with assistive device ?Sit to Supine: Independent with assistive device ?Transfers ?Transfers: Sit to Stand;Stand to [employer] Transfers ?Sit to Stand: Independent ?Stand to Sit: Independent ?Stand Pivot Transfers: Independent with assistive device (increased time) ?Transfer (Assistive device): None ?Locomotion  ?Gait ?Ambulation: Yes ?Gait Assistance: Independent with assistive device ?Gait Distance (Feet): 150 Feet ?Assistive device: None ?Gait ?Gait: Yes ?Gait Pattern: Impaired ?Gait Pattern: Step-through pattern;Right genu recurvatum;Left genu recurvatum;Wide base of support;Poor foot clearance - left;Poor foot clearance - right ?Gait velocity: grossly normal ?Stairs / Additional Locomotion ?Stairs: Yes ?Stairs Assistance: Supervision/Verbal cueing ?Stair Management Technique: One rail Right ?Number of Stairs: 16 ?Height of Stairs: 6 ?Ramp: Independent with assistive device (increased time) ?Wheelchair Mobility ?Wheelchair Mobility: No  ?Trunk/Postural Assessment  ?Cervical Assessment ?Cervical Assessment: Within Functional Limits ?Thoracic Assessment ?Thoracic Assessment: Exceptions to [facility] (decreased ROM due to surgical  incision) ?Lumbar Assessment ?Lumbar Assessment: Within Functional Limits ?Postural Control ?Postural Control: Within Functional Limits  ?Balance ?Balance ?Balance Assessed: Yes ?Static Sitting Balance ?Static Sitting - Balance Support: Feet supported;No upper extremity supported ?Static Sitting - Level of Assistance: 7: Independent ?Dynamic Sitting Balance ?Dynamic Sitting - Balance Support: Feet supported;No upper extremity supported ?Dynamic Sitting - Level of Assistance: 7: Independent ?Static Standing Balance ?Static Standing - Balance Support: No upper extremity supported ?Static Standing - Level of Assistance: 7: Independent ?Dynamic Standing Balance ?Dynamic  Standing - Balance Support: No upper extremity supported;During functional activity ?Dynamic Standing - Level of Assistance: 6: Modified independent (Device/Increase time) (increased time) ?Extremity Assessment  ?RLE Assessment ?RLE Assessment: Exceptions to [facility] ?RLE Strength ?Right Hip Flexion: 4/5 ?Right Hip ABduction: 4/5 ?Right Hip ADduction: 4/5 ?Right Knee Flexion: 4/5 ?Right Knee Extension: 4/5 ?Right Ankle Dorsiflexion: 4+/5 ?Right Ankle Plantar Flexion: 4+/5 ?LLE Assessment ?LLE Assessment: Exceptions to [facility] ?LLE Strength ?Left Hip Flexion: 4/5 ?Left Hip ABduction: 4/5 ?Left Hip ADduction: 4/5 ?Left Knee Flexion: 4/5 ?Left Knee Extension: 4/5 ?Left Ankle Dorsiflexion: 4+/5 ?Left Ankle Plantar Flexion: 4+/5 ? ?Blenda Nicely ?Becky Sax PT, DPT  ?09/19/2021, 7:36 AM ?

## 2021-09-19 NOTE — Progress Notes (Signed)
Occupational Therapy Discharge Summary ? ?Patient Details  ?Name: Cristian Grant ?MRN: [id number] ?Date of Birth: 18-Jan-1986 ? ? ?Patient has met 5 of 5 long term goals due to improved activity tolerance, improved balance, improved awareness, and improved coordination. Patient to discharge at overall mod I level. Pt's wife is able to provide supervision assist with higher level transfers such as stepping into shower or during higher level functional tasks. ? ?Reasons goals not met: NA ? ?Recommendation:  ?No OT f/u ? ?Equipment: ?Shower chair with back ? ?Reasons for discharge: treatment goals met ? ?Patient/family agrees with progress made and goals achieved: Yes ? ?OT Discharge ?Precautions/Restrictions  ?Precautions ?Precautions: Fall ?Precaution Comments: back precautions ?Restrictions ?Weight Bearing Restrictions: No ?Other Position/Activity Restrictions: no brace needed ?ADL ?ADL ?Eating: Independent ?Grooming: Modified independent ?Where Assessed-Grooming: Standing at sink ?Upper Body Bathing: Independent ?Where Assessed-Upper Body Bathing: Shower ?Lower Body Bathing: Modified independent ?Where Assessed-Lower Body Bathing: Shower ?Upper Body Dressing: Independent ?Where Assessed-Upper Body Dressing: Edge of bed ?Lower Body Dressing: Supervision/safety ?Where Assessed-Lower Body Dressing: Edge of bed ?Toileting: Modified independent ?Where Assessed-Toileting: Toilet ?Toilet Transfer: Modified independent ?Toilet Transfer Method: Ambulating ?Toilet Transfer Equipment: Raised toilet seat ?Tub/Shower Transfer: Not assessed ?Walk-In Shower Transfer: Distant supervision ?Walk-In Shower Transfer Method: Ambulating ?Walk-In Shower Equipment: Radio broadcast assistant ?Vision ?Baseline Vision/History: 0 No visual deficits ?Patient Visual Report: No change from baseline ?Vision Assessment?: No apparent visual deficits ?Perception  ?Perception: Within Functional Limits ?Praxis ?Praxis:  Intact ?Cognition ?Cognition ?Overall Cognitive Status: Within Functional Limits for tasks assessed ?Arousal/Alertness: Awake/alert ?Orientation Level: Person;Place;Situation ?Person: Oriented ?Place: Oriented ?Situation: Oriented ?Memory: Appears intact ?Awareness: Appears intact ?Problem Solving: Appears intact ?Safety/Judgment: Appears intact ?Brief Interview for Mental Status (BIMS) ?Repetition of Three Words (First Attempt): 3 ?Temporal Orientation: Year: Correct ?Temporal Orientation: Month: Accurate within 5 days ?Temporal Orientation: Day: Correct ?Recall: "Sock": Yes, no cue required ?Recall: "Blue": Yes, no cue required ?Recall: "Bed": Yes, no cue required ?BIMS Summary Score: 15 ?Sensation ?Sensation ?Light Touch: Appears Intact ?Proprioception: Appears Intact ?Additional Comments: BUE intact, however reports numbness on RLE and hypersensitivity on LLE ?Coordination ?Gross Motor Movements are Fluid and Coordinated: Yes ?Fine Motor Movements are Fluid and Coordinated: Yes ?Finger Nose Finger Test: [facility] bilaterally ?Motor  ?Motor ?Motor: Other (comment) ?Motor - Skilled Clinical Observations: slight uncoordination due to impaired sensation in BLE affecting balance/proprioception ?Mobility  ?Bed Mobility ?Bed Mobility: Rolling Right;Rolling Left;Sit to Supine;Supine to Sit ?Rolling Right: Independent with assistive device ?Rolling Left: Independent with assistive device ?Supine to Sit: Independent with assistive device ?Sit to Supine: Independent with assistive device ?Transfers ?Sit to Stand: Independent ?Stand to Sit: Independent  ?Trunk/Postural Assessment  ?Cervical Assessment ?Cervical Assessment: Within Functional Limits ?Thoracic Assessment ?Thoracic Assessment: Exceptions to [facility] (decreased ROM due to surgical incision) ?Lumbar Assessment ?Lumbar Assessment: Within Functional Limits ?Postural Control ?Postural Control: Within Functional Limits  ?Balance ?Balance ?Balance Assessed: Yes ?Static Sitting  Balance ?Static Sitting - Balance Support: Feet supported;No upper extremity supported ?Static Sitting - Level of Assistance: 7: Independent ?Dynamic Sitting Balance ?Dynamic Sitting - Balance Support: Feet supported;No upper extremity supported ?Dynamic Sitting - Level of Assistance: 7: Independent ?Static Standing Balance ?Static Standing - Balance Support: No upper extremity supported ?Static Standing - Level of Assistance: 7: Independent ?Dynamic Standing Balance ?Dynamic Standing - Balance Support: No upper extremity supported;During functional activity ?Dynamic Standing - Level of Assistance: 6: Modified independent (Device/Increase time) (increased time) ?Extremity/Trunk Assessment ?RUE Assessment ?RUE Assessment: Within Functional Limits ?LUE Assessment ?LUE Assessment: Within  Functional Limits ? ? ?[place] ?09/19/2021, 7:37 AM ?

## 2021-09-19 NOTE — Progress Notes (Signed)
Physical Therapy Session Note ? ?Patient Details  ?Name: Cristian Grant ?MRN: [id number] ?Date of Birth: Sep 15, 1985 ? ?Today's Date: 09/19/2021 ?PT Individual Time: 1100-1153 and 1330-1410 ?PT Individual Time Calculation (min): 53 min and 40 min ? ?Short Term Goals: ?Week 1:  PT Short Term Goal 1 (Week 1): STG=LTG due to LOS ? ?Skilled Therapeutic Interventions/Progress Updates:  ? Treatment Session 1 ?Received pt supine in bed napping, upon wakening pt agreeable to PT treatment and denied any pain during session. Session with emphasis on discharge planning, functional mobility/transfers, generalized strengthening and endurance, dynamic standing balance/coordination, simulated car transfers, NMR, and gait training. Pt transferred supine<>sitting EOB mod I and performed all transfers without AD mod I (due to need for increased time) throughout session. Pt ambulated 180ft without AD and mod I to ortho gym (holding IV pole) and performed ambulatory simulated car transfer without AD mod I then ambulated 30ft on uneven surfaces (ramp) without AD and mod I.  Ambulated 123ft without AD mod I to main therapy gym. Pt performed the following activities on [employer] with emphasis on dynamic standing balance/coordination: ?-limits of stability training on level 12 for 1 minute and 12 seconds and for 1 minute and 25 seconds beginning with BUE support and supervision fading to just hovering hands above support handles. ?-catch baseball game on level 12 for 3 minutes beginning with BUE support and supervision fading to just hovering hands above support handles. ?Pt then performed the following activities inside // bars with emphasis on dynamic standing balance/coordination, reaction time, proprioception, and control:  ?-forward/backwards tandem walking x 4 laps each with close supervision hovering UEs above bars ?-lateral stepping on balance beam x8 laps inside // bars with close supervision hovering UEs above  bars ?-forward/backwards tandem walking on balance beam x 4 laps each with occasional reliance on UE support ?Pt performed toe taps to 3 cones 1x6 sets bilaterally with CGA/close supervision for balance - no true LOB noted but generalized unsteadiness. Pt able to stand and pick up box of tissues without AD and close supervision for balance while adhering to back precautions. Transitioned to working on blocked practice sit<>stands 2x10 reps without UE support and supervision with emphasis on LE strength. Pt ambulated 155ft without AD and mod I back to room and pt left sitting in WC - made mod I in room today.  ? ?Treatment Session 2 ?Received pt supine in bed, pt agreeable to PT treatment and reported increased back pain at end of session - plan to call RN for pain medication after therapy. Session with emphasis on functional mobility/transfers, generalized strengthening and endurance, dynamic standing balance/coordination, and gait training. Pt transferred supine<>sitting EOB mod I and pt performed all transfers without AD mod I (due to need for increased time) throughout session. Pt ambulated >356ft x 2 trials without AD and mod I to/from dayroom. Pt performed seated BLE strengthening on Nustep starting at workload 8 for 2.5 minutes, decreasing to workload 7 for remainder of time for 10 minutes for a total of 663 steps with emphasis on cardiovascular endurance - steps/minute >65. Worked on dynamic standing balance/coordination, proprioception, and foot placement/control performing high knee marching holding 4.4lb medicine ball at chest 6ft x 8 laps then transitioning to holding 1lb dowel over head for additional 87ft x 8 laps to simulate marching band practice. Discussed modifications for playing trombone including having someone else carry instrument/case and placing it on chair instead of floor to prevent having to bend over to reach. Pt also reported  there is a stand that can support the instrument so that he  doesn't have to place all the weight on his shoulders. Pt then performed lateral flexion with 10lb dumbbell 3x10 bilaterally with emphasis on oblique strength. Concluded session with pt sitting EOB with all needs within reach.  ? ?Therapy Documentation ?Precautions:  ?Precautions ?Precautions: Fall ?Precaution Comments: back precautions ?Restrictions ?Weight Bearing Restrictions: No ?Other Position/Activity Restrictions: no brace needed ? ?Therapy/Group: Individual Therapy ?[name] ?Raechel Chute PT, DPT  ?09/19/2021, 7:23 AM  ?

## 2021-09-19 NOTE — Progress Notes (Signed)
Occupational Therapy Session Note ? ?Patient Details  ?Name: Cristian Grant ?MRN: [id number] ?Date of Birth: [birth year] ? ?Today's Date: 09/19/2021 ?OT Individual Time: 3235-5732 & 2025-4270 ?OT Individual Time Calculation (min): 54 min & 30 min ? ? ?Short Term Goals: ?Week 1:  OT Short Term Goal 1 (Week 1): STG= LTG d/t ELOS ? ?Skilled Therapeutic Interventions/Progress Updates:  ? ?Session 1 ?Skilled OT intervention completed with focus on d/c planning, kitchen management education, posterior chair stretching and UE strengthening. Pt received seated EOB, finishing breakfast with report of needing pain meds 2/2 pain in R side/back of RLE (3/10). Nurse notified and in room to provide meds. This therapist made cleared pt for mod I in room today, with nurse and pt notified of pt's clearance for bathroom and ambulation in room without assist. Pt donned shoes with set up A. Ambulated 200+ feet during session while going to different gyms and rehab areas with mod I without AD but slower pace. While in ADL kitchen, therapist provided education regarding how to limit heavy lifting and bending/twisting during meal prep. Pt was able to demonstrate reaching into several spaces with good body mechanics. In therapy gym, pt transitioned to supine on mat via side lying/log roll technique with mod I. While supine pt completed the following to promote light stretching to posterior chain 2/2 soreness, as well as strengthening of the core/UB: ? ?-Figure 4 stretch 2x10 with 10 sec holds, with breathing technique utilized for muscle relaxation ?-Ball raise with shoulder flexion/overhead lift for stretching of scapular region and deep into posterior chain ?-Knee in crunches, with modification made for heel taps to knee raises vs leg coming into full extension to prevent hip flexor activation vs core ?-Bench press 2x15 with 9 pounds ? ?Cues needed for form and technique and no pain reported with exercises other than stretching and  soreness. In room, pt was able to gather all items needed prior to sitting with mod I, and was left upright in w/c with all immediate needs met at end of session. ? ? ?Session 2 ?Skilled OT intervention completed with focus on community mobility, UB exercises. Pt received in bathroom, and was mod I for all ambulatory transfers during session. No c/o pain. To educate pt on community mobility pt ambulated without AD from room, onto elevators, outside on paved surfaces all with good safety awareness. Educated pt on body positioning when bystanders get close to him, especially if people don't anticipate him being unsteady 2/2 his age. Pt expressed potentially wanting a can to assist with community outings, with therapist printing off sheet for single point cane suggestion that could be purchased online at end of session. Standing at cement wall pt was able to complete 10 modified pushups, then task upgraded to 30 degree incline modified push up x10. Cues needed for using caution with opening heavy doors to prevent twisting however demonstrated safe ability with community navigation. Pt was left seated EOB with all immediate needs met at end of session. ? ? ? ?Therapy Documentation ?Precautions:  ?Precautions ?Precautions: Fall ?Precaution Comments: back precautions ?Restrictions ?Weight Bearing Restrictions: No ?Other Position/Activity Restrictions: no brace needed ? ? ? ?Therapy/Group: Individual Therapy ? ?[place] ?09/19/2021, 7:28 AM ?

## 2021-09-19 NOTE — Progress Notes (Signed)
Patient verbalized after questioned that he was given a QR code to a video about administration of his home ABX and his wife was trained to complete the infusion with home equipment ?

## 2021-09-19 NOTE — Progress Notes (Signed)
?                                                       PROGRESS NOTE ? ? ?Subjective/Complaints: ?Patient seen ambulating with therapy this morning.  He states he slept well overnight.  He states he is doing well.  He is very pleasant. ? ?ROS: Denies CP, SOB, N/V/D ? ?Objective: ?  ?[place] EKG Site [facility] ? ?Result Date: 09/17/2021 ?If [employer] not attached, placement could not be confirmed due to current cardiac rhythm.  ?No results for input(s): WBC, HGB, HCT, PLT in the last 72 hours. ? ?Recent Labs  ?  09/18/21 ?0406  ?NA 133*  ?K 3.9  ?CL 103  ?CO2 25  ?GLUCOSE 126*  ?BUN 11  ?CREATININE 0.84  ?CALCIUM 8.5*  ? ? ? ?Intake/Output Summary (Last 24 hours) at 09/19/2021 0925 ?Last data filed at 09/19/2021 0825 ?Gross per 24 hour  ?Intake 480 ml  ?Output --  ?Net 480 ml  ? ?  ? ?  ? ?Physical Exam: ?Vital Signs ?Blood pressure 111/67, pulse 100, temperature 98 ?F (36.7 ?C), temperature source Oral, resp. rate 16, height 6\' 2"  (1.88 m), weight (!) 140.2 kg, SpO2 97 %. ?Gen: no distress, normal appearing, BMI 39.68 ?HEENT: oral mucosa pink and moist, NCAT ?Cardio: Reg rate ?Chest: normal effort, normal rate of breathing ?Abd: soft, non-distended ?Ext: no edema ?Psych: pleasant, normal affect ?Skin: intact ?Neurological:  ?Alert ?Motor: RLE>LLE weakness, improving ?Ambulating without assistive device ? ? ?Assessment/Plan: ?1. Functional deficits which require 3+ hours per day of interdisciplinary therapy in a comprehensive inpatient rehab setting. ?Physiatrist is providing close team supervision and 24 hour management of active medical problems listed below. ?Physiatrist and rehab team continue to assess barriers to discharge/monitor patient progress toward functional and medical goals ? ?Care Tool: ? ?Bathing ?   ?Body parts bathed by patient: Right arm, Left arm, Chest, Abdomen, Front perineal area, Buttocks, Right upper leg, Face, Left upper leg, Right lower leg, Left lower leg  ? Body parts bathed by helper: Right  lower leg, Left lower leg ?  ?  ?Bathing assist Assist Level: Set up assist ?  ?  ?Upper Body Dressing/Undressing ?Upper body dressing   ?What is the patient wearing?: Pull over shirt ?   ?Upper body assist Assist Level: Independent ?   ?Lower Body Dressing/Undressing ?Lower body dressing ? ? ?   ?What is the patient wearing?: Underwear/pull up, Pants ? ?  ? ?Lower body assist Assist for lower body dressing: Supervision/Verbal cueing ?   ? ?Toileting ?Toileting    ?Toileting assist Assist for toileting: Independent with assistive device ?  ?  ?Transfers ?Chair/bed transfer ? ?Transfers assist ?   ? ?Chair/bed transfer assist level: Contact Guard/Touching assist ?  ?  ?Locomotion ?Ambulation ? ? ?Ambulation assist ? ?   ? ?Assist level: Supervision/Verbal cueing ?Assistive device: No Device ?Max distance: 139ft  ? ?Walk 10 feet activity ? ? ?Assist ?   ? ?Assist level: Supervision/Verbal cueing ?Assistive device: No Device  ? ?Walk 50 feet activity ? ? ?Assist   ? ?Assist level: Supervision/Verbal cueing ?Assistive device: No Device  ? ? ?Walk 150 feet activity ? ? ?Assist   ? ?Assist level: Supervision/Verbal cueing ?Assistive device: No Device ?  ? ?Walk 10 feet on  uneven surface  ?activity ? ? ?Assist   ? ? ?Assist level: Minimal Assistance - Patient > 75% ?Assistive device: Other (comment) (none)  ? ?Wheelchair ? ? ? ? ?Assist Is the patient using a wheelchair?: Yes ?Type of Wheelchair: Manual ?  ? ?Wheelchair assist level: Dependent - Patient 0% ?Max wheelchair distance: >167ft  ? ? ?Wheelchair 50 feet with 2 turns activity ? ? ? ?Assist ? ?  ?  ? ? ?Assist Level: Dependent - Patient 0%  ? ?Wheelchair 150 feet activity  ? ? ? ?Assist ?   ? ? ?Assist Level: Dependent - Patient 0%  ? ?Blood pressure 111/67, pulse 100, temperature 98 ?F (36.7 ?C), temperature source Oral, resp. rate 16, height 6\' 2"  (1.88 m), weight (!) 140.2 kg, SpO2 97 %. ? ?Medical Problem List and Plan: ?1. 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            Continue CIR ?2.  Antithrombotics: ?-DVT/anticoagulation:  Pharmaceutical: Lovenox ?            -antiplatelet therapy: N/A ?3. Spinal pain: Schedule Tylenol 650mg  TID.  Oxycodone prn effective.  ? Controlled with meds ?4. Mood: LCSW to follow for evaluation and support.  ?            -antipsychotic agents: N/A ?5. Neuropsych: This patient is capable of making decisions on his own behalf. ?6. Skin/Wound Care: Monitor incision for healing. Routine pressure relief measures ?7. Fluids/Electrolytes/Nutrition: Monitor I/Os ?8. Epidural abscess: Continue Cefazolin  for 6 weeks--end date 10/16/21 ?            --Sed rate/CRP tomorrow then per protocol.  ?9. Constipation: Continue Senna S 2 tabs at bedtime.   ?Improving ?10. Neurogenic bladder: Additional dose today and then increase Flomax to 0.8 mg/hs.  ?            --will monitor voiding with bladder scan/PVR checks.  ?11. Acute blood loss anemia: Hgb stable, repeat outpatient  ?12. Vitamin D deficiency: continue ergocalciferol 50,000U once per week for 7 weeks. ?13. Hypoalbuminemia: educated regarding importance of low added sugar/high protein diet. Discussed that nuts are a great source of protein ?14. Transaminitis: resolved, continue tylenol prn for pain.  ?15. Obesity BMI 39.68: provided list of foods that can assist with weight loss. Discussed benefits of nuts.  ?16.  Hyponatremia ? Sodium 133 on 4/14, continue to monitor ?  ? ?LOS: ?4 days ?A FACE TO FACE EVALUATION WAS PERFORMED ? ?Cristian Grant 12/16/21 ?09/19/2021, 9:25 AM  ? ?  ?

## 2021-09-20 NOTE — Progress Notes (Signed)
INPATIENT REHABILITATION DISCHARGE NOTE ? ? ?Discharge instructions by: Sharia Reeve LPN ? ?Verbalized understanding:  yes ? ?Skin care/Wound care healing? yes ? ?Pain: 0/10  given pain meds ? ?IV's: PICC sent home on IV ABX ? ?Tubes/Drains: none ? ?O2: room air  ? ?Safety instructions: completed ? ?Patient belongings: sent with family  ? ?Discharged to: home  ? ?Discharged [id number] ? ?Notes: ? ? ? ? ? ? ?  ?

## 2021-09-20 NOTE — Progress Notes (Signed)
?                                                       PROGRESS NOTE ? ? ?Subjective/Complaints: ?Patient seen ambulating in room.  Cleared to be independent.  He is eager for discharge.  He is very appreciative of his care. ? ?ROS: Denies CP, SOB, N/V/D ? ?Objective: ?  ?No results found. ?No results for input(s): WBC, HGB, HCT, PLT in the last 72 hours. ? ?Recent Labs  ?  09/18/21 ?0406  ?NA 133*  ?K 3.9  ?CL 103  ?CO2 25  ?GLUCOSE 126*  ?BUN 11  ?CREATININE 0.84  ?CALCIUM 8.5*  ? ? ? ?Intake/Output Summary (Last 24 hours) at 09/20/2021 0847 ?Last data filed at 09/19/2021 1815 ?Gross per 24 hour  ?Intake 1382.98 ml  ?Output --  ?Net 1382.98 ml  ? ?  ? ?  ? ?Physical Exam: ?Vital Signs ?Blood pressure 107/68, pulse 88, temperature 98.2 ?F (36.8 ?C), temperature source Oral, resp. rate 14, height 6\' 2"  (1.88 m), weight (!) 140.2 kg, SpO2 96 %. ?Gen: no distress, normal appearing, BMI 39.68 ?HEENT: oral mucosa pink and moist, NCAT ?Cardio: Reg rate ?Chest: normal effort, normal rate of breathing ?Abd: soft, non-distended ?Ext: no edema ?Psych: pleasant, normal affect ?Skin: intact ?Neurological:  ?Alert ?Motor: RLE>LLE weakness, improving ?Ambulating without assistive device ? ? ?Assessment/Plan: ?1. Functional deficits which require 3+ hours per day of interdisciplinary therapy in a comprehensive inpatient rehab setting. ?Physiatrist is providing close team supervision and 24 hour management of active medical problems listed below. ?Physiatrist and rehab team continue to assess barriers to discharge/monitor patient progress toward functional and medical goals ? ?Care Tool: ? ?Bathing ?   ?Body parts bathed by patient: Right arm, Left arm, Chest, Abdomen, Front perineal area, Buttocks, Right upper leg, Face, Left upper leg, Right lower leg, Left lower leg  ? Body parts bathed by helper: Right lower leg, Left lower leg ?  ?  ?Bathing assist Assist Level: Set up assist ?  ?  ?Upper Body Dressing/Undressing ?Upper body  dressing   ?What is the patient wearing?: Pull over shirt ?   ?Upper body assist Assist Level: Independent ?   ?Lower Body Dressing/Undressing ?Lower body dressing ? ? ?   ?What is the patient wearing?: Underwear/pull up, Pants ? ?  ? ?Lower body assist Assist for lower body dressing: Supervision/Verbal cueing ?   ? ?Toileting ?Toileting    ?Toileting assist Assist for toileting: Independent with assistive device ?  ?  ?Transfers ?Chair/bed transfer ? ?Transfers assist ?   ? ?Chair/bed transfer assist level: Independent with assistive device ?  ?  ?Locomotion ?Ambulation ? ? ?Ambulation assist ? ?   ? ?Assist level: Independent with assistive device ?Assistive device: No Device ?Max distance: >196ft  ? ?Walk 10 feet activity ? ? ?Assist ?   ? ?Assist level: Independent with assistive device ?Assistive device: No Device  ? ?Walk 50 feet activity ? ? ?Assist   ? ?Assist level: Independent with assistive device ?Assistive device: No Device  ? ? ?Walk 150 feet activity ? ? ?Assist   ? ?Assist level: Independent with assistive device ?Assistive device: No Device ?  ? ?Walk 10 feet on uneven surface  ?activity ? ? ?Assist   ? ? ?Assist level: Independent with  assistive device ?Assistive device: Other (comment) (increased time)  ? ?Wheelchair ? ? ? ? ?Assist Is the patient using a wheelchair?: No ?Type of Wheelchair: Manual ?Wheelchair activity did not occur: N/A ? ?Wheelchair assist level: Dependent - Patient 0% ?Max wheelchair distance: >140ft  ? ? ?Wheelchair 50 feet with 2 turns activity ? ? ? ?Assist ? ?  ?Wheelchair 50 feet with 2 turns activity did not occur: N/A ? ? ?Assist Level: Dependent - Patient 0%  ? ?Wheelchair 150 feet activity  ? ? ? ?Assist ? Wheelchair 150 feet activity did not occur: N/A ? ? ?Assist Level: Dependent - Patient 0%  ? ?Blood pressure 107/68, pulse 88, temperature 98.2 ?F (36.8 ?C), temperature source Oral, resp. rate 14, height 6\' 2"  (1.88 m), weight (!) 140.2 kg, SpO2 96 %. ? ?Medical  Problem List and Plan: ?1. Functional deficits secondary to thoracic myelopathy ?DC today ?2.  Antithrombotics: ?-DVT/anticoagulation:  Pharmaceutical: Lovenox ?            -antiplatelet therapy: N/A ?3. Spinal pain: Schedule Tylenol 650mg  TID.  Oxycodone prn effective.  ? Controlled with meds ?4. Mood: LCSW to follow for evaluation and support.  ?            -antipsychotic agents: N/A ?5. Neuropsych: This patient is capable of making decisions on his own behalf. ?6. Skin/Wound Care: Monitor incision for healing. Routine pressure relief measures ?7. Fluids/Electrolytes/Nutrition: Monitor I/Os ?8. Epidural abscess: Continue Cefazolin  for 6 weeks--end date 10/16/21 ?            --Sed rate/CRP tomorrow then per protocol.  ?9. Constipation: Continue Senna S 2 tabs at bedtime.   ?Improved ?10. Neurogenic bladder: Additional dose today and then increase Flomax to 0.8 mg/hs.  ?            --will monitor voiding with bladder scan/PVR checks.  ?11. Acute blood loss anemia: Hgb stable, repeat outpatient  ?12. Vitamin D deficiency: continue ergocalciferol 50,000U once per week for 7 weeks. ?13. Hypoalbuminemia: educated regarding importance of low added sugar/high protein diet. Discussed that nuts are a great source of protein ?14. Transaminitis: resolved, continue tylenol prn for pain.  ?15. Obesity BMI 39.68: provided list of foods that can assist with weight loss. Discussed benefits of nuts.  ?16.  Hyponatremia ? Sodium 133 on 4/14, continue to monitor ?  ? ?LOS: ?5 days ?A FACE TO FACE EVALUATION WAS PERFORMED ? ?Cristian Grant 12/16/21 ?09/20/2021, 8:47 AM  ? ?  ?

## 2021-09-29 ENCOUNTER — Encounter: Payer: Self-pay | Admitting: Physical Medicine and Rehabilitation

## 2021-09-29 ENCOUNTER — Encounter
Payer: BC Managed Care – PPO | Attending: Physical Medicine and Rehabilitation | Admitting: Physical Medicine and Rehabilitation

## 2021-09-29 VITALS — BP 115/76 | HR 79 | Ht 74.0 in | Wt 319.6 lb

## 2021-09-29 DIAGNOSIS — G9520 Unspecified cord compression: Secondary | ICD-10-CM | POA: Diagnosis present

## 2021-09-29 DIAGNOSIS — G8918 Other acute postprocedural pain: Secondary | ICD-10-CM | POA: Diagnosis present

## 2021-09-29 DIAGNOSIS — K5903 Drug induced constipation: Secondary | ICD-10-CM | POA: Diagnosis present

## 2021-09-29 NOTE — Progress Notes (Signed)
? ?Subjective:  ? ? Patient ID: Ezequiel GanserChristopher Repire Moose, male    DOB: 1985-08-18, 36 y.o.   MRN: [id number] ? ?HPI ?Mr. Adriana SimasCook is a 36 year old man who presents for hospital follow-up after CIR admission for epidural abscess with cord compression. ?-spinal pain is manageable ?-Walks 250 to 500 feet per day.  ?-Has been trying to eat pecans and pistachios ?-Not moving bowels regularly with the pain meds ?-Trying to eat soups.  ?-He is having a bowel movement every other day ?-He feels numb aroung the rectum and bladder area and still cannot sense when he is going to have a bowel movement or urinate ?-he does not need any refills of his medications ?-he has been using his pain medications minimally.  ?-has started home therapy ? ?Pain Inventory ?Average Pain 2 ?Pain Right Now 1 ?My pain is intermittent and aching ? ?In the last 24 hours, has pain interfered with the following? ?General activity 0 ?Relation with others 0 ?Enjoyment of life 0 ?What TIME of day is your pain at its worst? evening ?Sleep (in general) Fair ? ?Pain is worse with: bending and sitting ?Pain improves with: rest, therapy/exercise, and medication ?Relief from Meds: 8 ? ?walk without assistance ?use a cane ?how many minutes can you walk? 5 ?ability to climb steps?  yes ?do you drive?  no ?Do you have any goals in this area?  yes ? 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 ?numbness ?tingling ?trouble walking ? ?Any changes since last visit?  no ? ?Any changes since last visit?  no ? ? ? ?Family History  ?Problem Relation Age of Onset  ? Diabetes Mother   ? Crohn's disease Mother   ? Crohn's disease Sister   ? ?Social History  ? ?Socioeconomic History  ? Marital status: Married  ?  Spouse name: Not on file  ? Number of children: Not on file  ? Years of education: Not on file  ? Highest education level: Not on file  ?Occupational History  ? Not on file  ?Tobacco Use  ? Smoking  status: Never  ? Smokeless tobacco: Never  ?Vaping Use  ? Vaping Use: Never used  ?Substance and Sexual Activity  ? Alcohol use: Yes  ? Drug use: Not Currently  ? Sexual activity: Not on file  ?Other Topics Concern  ? Not on file  ?Social History Narrative  ? Not on file  ? ?Social Determinants of Health  ? ?Financial Resource Strain: Not on file  ?Food Insecurity: Not on file  ?Transportation Needs: Not on file  ?Physical Activity: Not on file  ?Stress: Not on file  ?Social Connections: Not on file  ? ?Past Surgical History:  ?Procedure Laterality Date  ? LUMBAR LAMINECTOMY/DECOMPRESSION MICRODISCECTOMY N/A 09/04/2021  ? Procedure: THORACIC FIVE, THORACIC SIX, THORACIC SEVEN LAMINECTOMY FOR DECOMPRESSION AND  RESECTION OF SYNOVIAL CYST;  Surgeon: Dawley, Alan Mulderroy C, DO;  Location: [facility] OR;  Service: Neurosurgery;  Laterality: N/A;  ? TEE WITHOUT CARDIOVERSION N/A 09/14/2021  ? Procedure: TRANSESOPHAGEAL ECHOCARDIOGRAM (TEE);  Surgeon: Elease HashimotoNahser, Deloris PingPhilip J, MD;  [facility];  Service: Cardiovascular;  Laterality: N/A;  ? ?Past Medical History:  ?Diagnosis Date  ? Hyperlipidemia   ? ?BP 115/76   Pulse 79   Ht 6\' 2"  (1.88 m)   Wt (!) 319 lb 9.6 oz (145 kg)   SpO2 95%   BMI 41.03 kg/m?  ? ?Opioid Risk  Score:   ?Fall Risk Score:  `1 ? ?Depression screen PHQ 2/9 ? ? ?  09/29/2021  ?  3:38 PM  ?Depression screen PHQ 2/9  ?Decreased Interest 0  ?Down, Depressed, Hopeless 0  ?PHQ - 2 Score 0  ?Altered sleeping 0  ?Tired, decreased energy 1  ?Change in appetite 0  ?Feeling bad or failure about yourself  0  ?Trouble concentrating 0  ?Moving slowly or fidgety/restless 0  ?Suicidal thoughts 0  ?PHQ-9 Score 1  ?Difficult doing work/chores Not difficult at all  ?  ? ?Review of Systems  ?Constitutional: Negative.   ?HENT: Negative.    ?Eyes: Negative.   ?Respiratory: Negative.    ?Cardiovascular: Negative.   ?Gastrointestinal: Negative.   ?Endocrine: Negative.   ?Genitourinary: Negative.   ?Musculoskeletal:  Positive for back  pain and gait problem.  ?Skin: Negative.   ?Allergic/Immunologic: Negative.   ?Neurological:  Positive for numbness.  ?Hematological: Negative.   ?Psychiatric/Behavioral: Negative.    ? ?   ?Objective:  ? Physical Exam ?Gen: no distress, normal appearing ?HEENT: oral mucosa pink and moist, NCAT ?Cardio: Reg rate ?Chest: normal effort, normal rate of breathing ?Abd: soft, non-distended ?Ext: no edema ?Psych: pleasant, normal affect ?Skin: intact ?Neuro: Alert and oriented x3 ?Musculoskeletal: RLE>LLE strength, improving ?   ?Assessment & Plan:  ?Mr. Alverio is a 36 year old man who presents for hospital follow-up after CIR admission for epidural abscess.  ? ?1) Epidural abscess ?-continue antibiotics ?-has follow-up with ID ? ?2) Neurogenic bowel and bladder ?-provided education on spinal cord injury and nerve damage and how this can affect the nerves responsible for bowel and bladder function ?-discussed prognosis ? ?3) Constipation:  ?-Provided list of following foods that help with constipation and highlighted a few: ?1) prunes- contain high amounts of fiber.  ?2) apples- has a form of dietary fiber called pectin that accelerates stool movement and increases beneficial gut bacteria ?3) pears- in addition to fiber, also high in fructose and sorbitol which have laxative effect ?4) figs- contain an enzyme ficin which helps to speed colonic transit ?5) kiwis- contain an enzyme actinidin that improves gut motility and reduces constipation ?6) oranges- rich in pectin (like apples) ?7) grapefruits- contain a flavanol naringenin which has a laxative effect ?8) vegetables- rich in fiber and also great sources of folate, vitamin C, and K ?9) artichoke- high in inulin, prebiotic great for the microbiome ?10) chicory- increases stool frequency and softness (can be added to coffee) ?11) rhubarb- laxative effect ?12) sweet potato- high fiber ?13) beans, peas, and lentils- contain both soluble and insoluble fiber ?14) chia seeds-  improves intestinal health and gut flora ?15) flaxseeds- laxative effect ?16) whole grain rye bread- high in fiber ?17) oat bran- high in soluble and insoluble fiber ?18) kefir- softens stools ?-recommended to try at least one of these foods every day.  ?-drink 6-8 glasses of water per day ?-walk regularly, especially after meals.  ? ? ? 4) Obesity: ?-Educated that current weight is  319 lbs and current BMI is 41.03 ?-Educated regarding health benefits of weight loss- for pain, general health, chronic disease prevention, immune health, mental health.  ?-Will monitor weight every visit.  ?-Consider Roobois tea daily.  ?-Discussed the benefits of intermittent fasting. ?-Discussed foods that can assist in weight loss: ?1) leafy greens- high in fiber and nutrients ?2) dark chocolate- improves metabolism (if prefer sweetened, best to sweeten with honey instead of sugar).  ?3) cruciferous vegetables- high in fiber  and protein ?4) full fat yogurt: high in healthy fat, protein, calcium, and probiotics ?5) apples- high in a variety of phytochemicals ?6) nuts- high in fiber and protein that increase feelings of fullness ?7) grapefruit: rich in nutrients, antioxidants, and fiber (not to be taken with anticoagulation) ?8) beans- high in protein and fiber ?9) salmon- has high quality protein and healthy fats ?10) green tea- rich in polyphenols ?11) eggs- rich in choline and vitamin D ?12) tuna- high protein, boosts metabolism ?13) avocado- decreases visceral abdominal fat ?14) chicken (pasture raised): high in protein and iron ?15) blueberries- reduce abdominal fat and cholesterol ?16) whole grains- decreases calories retained during digestion, speeds metabolism ?17) chia seeds- curb appetite ?18) chilies- increases fat metabolism  ?-Discussed supplements that can be used: ? 1) Metatrim 400mg  BID 30 minutes before breakfast and dinner ? 2) Sphaeranthus indicus and Garcinia mangostana (combinations of these and #1 can be found in  capsicum and zychrome ? 3) green coffee bean extract 400mg  twice per day or Irvingia (african mango) 150 to 300mg  twice per day. ? ?5) Postoperative pain ?-Provided with a pain relief journal and discussed that

## 2021-10-07 ENCOUNTER — Ambulatory Visit: Payer: BC Managed Care – PPO | Admitting: Internal Medicine

## 2021-10-07 NOTE — Progress Notes (Deleted)
[facility]  CHIEF COMPLAINT:    Follow up for epidural abscess  SUBJECTIVE:    Cristian Grant is a 36 y.o. male with PMHx as below who presents to the clinic for epidural abscess.   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  He was taken to the OR 3/31 where purulence was unexpectedly encountered.  This was cultured and grew MSSA.  Blood cultures and TEE were negative.    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  No indications for repeat debridement per NSGY.  He had a PICC line placed and OPAT orders to receive Cefazolin through 5/12.  He spent a few days in rehab following hospital discharge where he continued to progress and was subsequently discharged home.  His OPAT labs from 4/24 show that his ESR remains elevated at 62 from baseline of 58.  His CRP is normalized.  He continues to progress with his recovery and pain is improved.  He is regaining more function daily.  He had follow up with NSGY on *** and reports ***.   Please see A&P for the details of today's visit and status of the patient's medical problems.   Patient's Medications  New Prescriptions   No medications on file  Previous Medications   ACETAMINOPHEN (TYLENOL) 325 MG TABLET    Take 2 tablets (650 mg total) by mouth 3 (three) times daily.   CEFAZOLIN (ANCEF) IVPB    Inject 2 g into the vein every 8 (eight) hours. Indication: MSSA epidural abscess First Dose: Yes Last Day of Therapy:  10/16/21 Labs - Once weekly:  CBC/D and BMP, Labs - Every other week:  ESR and CRP Pull PICC at the completion of IV  therapy Method of administration: IV Push Method of administration may be changed at the discretion of home infusion pharmacist based upon assessment of the patient and/or caregiver's ability to self-administer the medication ordered.   GUAIFENESIN (MUCINEX) 600 MG 12 HR TABLET    Take 2 tablets (1,200 mg total) by mouth 2 (two) times daily as needed.   MELATONIN 5 MG TABS    Take 1 tablet (5 mg total) by mouth at bedtime as needed.   METHOCARBAMOL (ROBAXIN) 500 MG TABLET    Take 1 tablet (500 mg total) by mouth every 6 (six) hours as needed for muscle spasms.   MULTIPLE VITAMIN (MULTIVITAMIN WITH MINERALS) TABS TABLET    Take 1 tablet by mouth daily.   OXYCODONE 10 MG TABS    Take 0.5-1 tablets (5-10 mg total) by mouth every 6 (six) hours as needed for severe pain.   ROSUVASTATIN (CRESTOR) 10 MG TABLET    Take 1 tablet (10 mg total) by mouth daily.   SENNA-DOCUSATE (SENOKOT-S) 8.6-50 MG TABLET    Take 2 tablets by mouth at bedtime.   TAMSULOSIN (FLOMAX) 0.4 MG CAPS CAPSULE    Take 2 capsules (0.8 mg total) by mouth daily after supper.   VITAMIN D, ERGOCALCIFEROL, (DRISDOL) 1.25 MG (50000 UNIT) CAPS CAPSULE    Take 1 capsule (50,000 Units total) by mouth every 7 (seven) days.  Modified Medications  No medications on file  Discontinued Medications   No medications on file      Past Medical History:  Diagnosis Date   Hyperlipidemia     Social History   Tobacco Use   Smoking status: Never   Smokeless tobacco: Never  Vaping Use   Vaping Use: Never used  Substance Use Topics   Alcohol use: Yes   Drug use: Not Currently    Family History  Problem Relation Age of Onset   Diabetes Mother    Crohn's disease Mother    Crohn's disease Sister     No Known Allergies  ROS   OBJECTIVE:    There were no vitals filed for this visit. There is no height or weight on file to calculate BMI.  Physical Exam   Labs and Microbiology:    Latest Ref Rng & Units 09/16/2021    4:36 AM  09/14/2021    7:35 AM 09/09/2021    5:23 AM  CBC  WBC 4.0 - 10.5 K/uL 8.6   8.3   12.4    Hemoglobin 13.0 - 17.0 g/dL 11.8   12.2   11.9    Hematocrit 39.0 - 52.0 % 36.0   37.9   37.8    Platelets 150 - 400 K/uL 406   419   456        Latest Ref Rng & Units 09/18/2021    4:06 AM 09/16/2021    4:36 AM 09/14/2021    7:35 AM  CMP  Glucose 70 - 99 mg/dL 126   98   99    BUN 6 - 20 mg/dL _0 Creatinine 0.61 - 1.24 mg/dL 0.84   0.91   0.88    Sodium 135 - 145 mmol/L 133   135   135    Potassium 3.5 - 5.1 mmol/L 3.9   4.4   4.2    Chloride 98 - 111 mmol/L 103   104   103    CO2 22 - 32 mmol/L _1 Calcium 8.9 - 10.3 mg/dL 8.5   9.0   9.2    Total Protein 6.5 - 8.1 g/dL 7.2   7.6     Total Bilirubin 0.3 - 1.2 mg/dL 0.5   0.7     Alkaline Phos 38 - 126 U/L 72   70     AST 15 - 41 U/L 27   29     ALT 0 - 44 U/L 35   46        No results found for this or any previous visit (from the past 240 hour(s)).  Imaging: ***   ASSESSMENT & PLAN:    No problem-specific Assessment & Plan notes found for this encounter.   No orders of the defined types were placed in this encounter.    There are no diagnoses linked to this encounter.  Patient is continuing to improve on a regular basis after T5-7 laminectomy, resection of epidural lipomatosis and abscess 3/31 with neurosurgery.  No hardware placed at time of surgery.  MRI on 4/2 noted possible epidural phlegmon at T7 and T8 but no other drainable abscess.  Will continue with cefazolin via PICC line through 5/12 then starting 5/13 will begin cefadroxil 1gm BID for 2 more weeks to complete 8 weeks of antibiotics total.     Raynelle Highland for Infectious Disease [facility] 10/07/2021, 8:02 AM

## 2021-10-13 ENCOUNTER — Ambulatory Visit (INDEPENDENT_AMBULATORY_CARE_PROVIDER_SITE_OTHER): Payer: BC Managed Care – PPO | Admitting: Internal Medicine

## 2021-10-13 ENCOUNTER — Encounter: Payer: Self-pay | Admitting: Internal Medicine

## 2021-10-13 ENCOUNTER — Other Ambulatory Visit: Payer: Self-pay

## 2021-10-13 VITALS — BP 132/83 | HR 94 | Temp 98.3°F | Resp 16 | Ht 74.0 in | Wt 322.0 lb

## 2021-10-13 DIAGNOSIS — G062 Extradural and subdural abscess, unspecified: Secondary | ICD-10-CM | POA: Diagnosis not present

## 2021-10-13 DIAGNOSIS — Z452 Encounter for adjustment and management of vascular access device: Secondary | ICD-10-CM

## 2021-10-13 MED ORDER — CEFADROXIL 1 G PO TABS: 1.0000 g | ORAL_TABLET | Freq: Two times a day (BID) | ORAL | 0 refills | Status: AC

## 2021-10-13 NOTE — Assessment & Plan Note (Signed)
PICC in place for IV antibiotics and no issues.  Will be removed following IV abx.  ?

## 2021-10-13 NOTE — Assessment & Plan Note (Addendum)
Patient is continuing to improve on a regular basis after T5-7 laminectomy, resection of epidural lipomatosis and abscess 3/31 with neurosurgery.  No hardware placed at time of surgery.  MRI on 4/2 noted possible epidural phlegmon at T7 and T8 but no other drainable abscess.  Will continue with cefazolin via PICC line through 5/12 then starting 5/13 will begin cefadroxil 1gm BID for 2 more weeks to complete 8 weeks of antibiotics total.  Will plan for PICC line to be removed after IV therapy.  OPAT labs this week did not include ESR/CRP so will obtain today.  Will repeat MRI to reassess epidural phlegmon to ensure no abscess formation prior to stopping antibiotics.  Follow up in 2 weeks.  ?

## 2021-10-13 NOTE — Progress Notes (Signed)
Labs drawn via PICC line per Dr. Earlene Plater. Line flushed with 10 mL normal saline and clamped. Patient tolerated well.  ? ?Sandie Ano, RN ? ?

## 2021-10-13 NOTE — Progress Notes (Signed)
?  ? ? ? ? ?[facility] for Infectious Disease ? ?CHIEF COMPLAINT:   ? ?Follow up for epidural abscess ? ?SUBJECTIVE:   ? ?Cristian Grant is a 36 y.o. male with PMHx as below who presents to the clinic for epidural abscess.  ? 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  He was taken to the OR 3/31 where purulence was unexpectedly encountered.  This was cultured and grew MSSA.  Blood cultures and TEE were negative.   ? 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  No indications for repeat debridement per NSGY. ? ?He had a PICC line placed and OPAT orders to receive Cefazolin through 5/12.  He spent a few days in rehab following hospital discharge where he continued to progress and was subsequently discharged home.  His OPAT labs from 4/24 show that his ESR remains elevated at 62 from baseline of 58.  His CRP is normalized.  He continues to progress with his recovery and pain is improved.  He is regaining more function daily.  He had follow up with NSGY  and reports will see Dr [name] again in July.  ? ?Please see A&P for the details of today's visit and status of the patient's medical problems.  ? ?Patient's Medications  ?New Prescriptions  ? CEFADROXIL (DURICEF) 1 G TABLET    Take 1 tablet (1 g total) by mouth 2 (two) times daily for 14 days.  ?Previous Medications  ? ACETAMINOPHEN (TYLENOL) 325 MG TABLET    Take 2 tablets (650 mg total) by mouth 3 (three) times daily.  ? CEFAZOLIN (ANCEF) IVPB    Inject 2 g into the vein every 8 (eight) hours. Indication: MSSA epidural abscess ?First Dose: Yes ?Last Day of Therapy:  10/16/21 ?Labs -  Once weekly:  CBC/D and BMP, ?Labs - Every other week:  ESR and CRP ?Pull PICC at the completion of IV therapy ?Method of administration: IV Push ?Method of administration may be changed at the discretion of home infusion pharmacist based upon assessment of the patient and/or caregiver's ability to self-administer the medication ordered.  ? GUAIFENESIN (MUCINEX) 600 MG 12 HR TABLET    Take 2 tablets (1,200 mg total) by mouth 2 (two) times daily as needed.  ? MELATONIN 5 MG TABS    Take 1 tablet (5 mg total) by mouth at bedtime as needed.  ? METHOCARBAMOL (ROBAXIN) 500 MG TABLET    Take 1 tablet (500 mg total) by mouth every 6 (six) hours as needed for muscle spasms.  ? MULTIPLE VITAMIN (MULTIVITAMIN WITH MINERALS) TABS TABLET    Take 1 tablet by mouth daily.  ? OXYCODONE 10 MG TABS    Take 0.5-1 tablets (5-10 mg total) by mouth every 6 (six) hours as needed for severe pain.  ? ROSUVASTATIN (CRESTOR) 10 MG TABLET    Take 1 tablet (10 mg total) by mouth daily.  ? SENNA-DOCUSATE (SENOKOT-S) 8.6-50 MG TABLET    Take 2 tablets by mouth at bedtime.  ? TAMSULOSIN (FLOMAX) 0.4 MG CAPS CAPSULE    Take 2 capsules (0.8 mg total) by mouth daily after supper.  ? VITAMIN D, ERGOCALCIFEROL, (DRISDOL) 1.25  MG (50000 UNIT) CAPS CAPSULE    Take 1 capsule (50,000 Units total) by mouth every 7 (seven) days.  ?Modified Medications  ? No medications on file  ?Discontinued Medications  ? No medications on file  ?   ? ?Past Medical History:  ?Diagnosis Date  ? Hyperlipidemia   ? ? ?Social History  ? ?Tobacco Use  ? Smoking status: Never  ? Smokeless tobacco: Never  ?Vaping Use  ? Vaping Use: Never used  ?Substance Use Topics  ? Alcohol use: Yes  ? Drug use: Not Currently  ? ? ?Family History  ?Problem Relation Age of Onset  ? Diabetes Mother   ? Crohn's disease Mother   ? Crohn's disease Sister   ? ? ?No Known Allergies ? ?Review of Systems  ?All other systems reviewed and are negative. Except as noted above.  ? ? ?OBJECTIVE:   ? ?Vitals:   ? 10/13/21 1050  ?BP: 132/83  ?Pulse: 94  ?Resp: 16  ?Temp: 98.3 ?F (36.8 ?C)  ?TempSrc: Temporal  ?SpO2: 96%  ?Weight: (!) 322 lb (146.1 kg)  ?Height: 6' 2"  (1.88 m)  ? ?Body mass index is 41.34 kg/m?. ? ?Physical Exam ?Constitutional:   ?   General: He is not in acute distress. ?   Appearance: Normal appearance.  ?HENT:  ?   Head: Normocephalic and atraumatic.  ?Eyes:  ?   Extraocular Movements: Extraocular movements intact.  ?   Conjunctiva/sclera: Conjunctivae normal.  ?Pulmonary:  ?   Effort: Pulmonary effort is normal. No respiratory distress.  ?Musculoskeletal:     ?   General: Normal range of motion.  ?   Comments: PICC in place.   ?Skin: ?   General: Skin is warm and dry.  ?   Findings: No rash.  ?Neurological:  ?   General: No focal deficit present.  ?   Mental Status: He is alert and oriented to person, place, and time.  ?Psychiatric:     ?   Mood and Affect: Mood normal.     ?   Behavior: Behavior normal.  ? ? ? ?Labs and Microbiology: ? ?  Latest Ref Rng & Units 09/16/2021  ?  4:36 AM 09/14/2021  ?  7:35 AM 09/09/2021  ?  5:23 AM  ?CBC  ?WBC 4.0 - 10.5 K/uL 8.6   8.3   12.4    ?Hemoglobin 13.0 - 17.0 g/dL 11.8   12.2   11.9    ?Hematocrit 39.0 - 52.0 % 36.0   37.9   37.8    ?Platelets 150 - 400 K/uL 406   419   456    ? ? ?  Latest Ref Rng & Units 09/18/2021  ?  4:06 AM 09/16/2021  ?  4:36 AM 09/14/2021  ?  7:35 AM  ?CMP  ?Glucose 70 - 99 mg/dL 126   98   99    ?BUN 6 - 20 mg/dL 11   13   12     ?Creatinine 0.61 - 1.24 mg/dL 0.84   0.91   0.88    ?Sodium 135 - 145 mmol/L 133   135   135    ?Potassium 3.5 - 5.1 mmol/L 3.9   4.4   4.2    ?Chloride 98 - 111 mmol/L 103   104   103    ?CO2 22 - 32 mmol/L 25   24   24     ?Calcium 8.9 - 10.3 mg/dL 8.5   9.0   9.2    ?  Total Protein 6.5 - 8.1 g/dL 7.2   7.6     ?Total Bilirubin 0.3 - 1.2 mg/dL 0.5   0.7     ?Alkaline Phos 38 - 126 U/L 72   70     ?AST 15 - 41 U/L 27   29     ?ALT 0 - 44 U/L 35   46     ?  ? ? ?ASSESSMENT & PLAN:   ? ?Epidural abscess ?Patient is  continuing to improve on a regular basis after T5-7 laminectomy, resection of epidural lipomatosis and abscess 3/31 with neurosurgery.  No hardware placed at time of surgery.  MRI on 4/2 noted possible epidural phlegmon at T7 and T8 but no other drainable abscess.  Will continue with cefazolin via PICC line through 5/12 then starting 5/13 will begin cefadroxil 1gm BID for 2 more weeks to complete 8 weeks of antibiotics total.  Will plan for PICC line to be removed after IV therapy.  OPAT labs this week did not include ESR/CRP so will obtain today.  Will repeat MRI to reassess epidural phlegmon to ensure no abscess formation prior to stopping antibiotics.  Follow up in 2 weeks.  ? ?PICC (peripherally inserted central catheter) in place ?PICC in place for IV antibiotics and no issues.  Will be removed following IV abx.  ? ? ?Orders Placed This Encounter  ?Procedures  ? MR THORACIC SPINE W WO CONTRAST  ?  Standing Status:   Future  ?  Standing Expiration Date:   10/14/2022  ?  Order Specific Question:   GRA to provide read?  ?  Answer:   Yes  ?  Order Specific Question:   If indicated for the ordered procedure, I authorize the administration of contrast media per Radiology protocol  ?  Answer:   Yes  ?  Order Specific Question:   What is the patient's sedation requirement?  ?  Answer:   No Sedation  ?  Order Specific Question:   Does the patient have a pacemaker or implanted devices?  ?  Answer:   No  ?  Order Specific Question:   Preferred imaging location?  ?  Answer:   [facility] (table limit - 500 lbs)  ? Sedimentation rate  ? C-reactive protein  ?  ? ?Mignon Pine ?[facility] for Infectious Disease ?[facility] ?10/13/2021, 11:28 AM ? ?I have personally spent 40 minutes involved in face-to-face and non-face-to-face activities for this patient on the day of the visit. Professional time spent includes the following activities: Preparing to see the patient (review of tests), Obtaining  and/or reviewing separately obtained history (admission/discharge record), Performing a medically appropriate examination and/or evaluation , Ordering medications/tests/procedures, referring and communicating wi

## 2021-10-13 NOTE — Patient Instructions (Signed)
Thank you for coming to see me today. It was a pleasure seeing you. ? ?To Do: ?Continue iv antibiotics through 5/12 ?Starting 5/13 begin cefadroxil 1gm twice daily for 2 weeks ?Prescription sent to your pharmacy ?MRI ordered to be scheduled ?Follow up in 2 weeks ? ?If you have any questions or concerns, please do not hesitate to call the office at [phone]. ? ?Take Care,  ? ?Gwynn Burly ? ?

## 2021-10-14 ENCOUNTER — Telehealth: Payer: Self-pay

## 2021-10-14 LAB — C-REACTIVE PROTEIN: CRP: 12.9 mg/L — ABNORMAL HIGH (ref <8.0)

## 2021-10-14 LAB — SEDIMENTATION RATE: Sed Rate: 31 mm/h — ABNORMAL HIGH (ref 0–15)

## 2021-10-14 NOTE — Telephone Encounter (Signed)
Spoke to [employer] at [employer] to inform a Software engineer of verbal orders. There wasn't a pharmacist available to give verbal orders to. Kayla requested for a message to be sent via Epic and she will forward to pharmacist. [place] notified as well.  ? ? ? ?[place], Abigail  [place], [place], [place]; [place], Lonn [place]; [place], Leith; Metta Clines, [place], RPH-CPP; 1 other ?received, thank you   ?  ?   ?Previous Messages ?  ?----- Message -----  ?From: Tomi Bamberger, CMA  ?Sent: 10/13/2021  11:57 AM EDT  ?To: Liliana Cline, *  ?Subject: Pull PICC                                      ? ?Hey yall,  ? ?Per Dr.Wallace patient PICC line will need to be pulled after last dose of IV Abx on 10/16/2021.  ? ?Tagged our pharmacy team so that they're aware also.  ? ?Thank you!  ? ?Darah Simkin ?

## 2021-10-17 ENCOUNTER — Other Ambulatory Visit: Payer: Self-pay | Admitting: Physical Medicine and Rehabilitation

## 2021-10-19 ENCOUNTER — Other Ambulatory Visit: Payer: Self-pay | Admitting: Physical Medicine and Rehabilitation

## 2021-10-24 ENCOUNTER — Ambulatory Visit (HOSPITAL_COMMUNITY): Payer: BC Managed Care – PPO

## 2021-10-24 ENCOUNTER — Ambulatory Visit (HOSPITAL_COMMUNITY)
Admission: RE | Admit: 2021-10-24 | Discharge: 2021-10-24 | Disposition: A | Discharge status: Home / Self Care | Payer: BC Managed Care – PPO | Source: Ambulatory Visit | Attending: Internal Medicine | Admitting: Internal Medicine

## 2021-10-24 DIAGNOSIS — G062 Extradural and subdural abscess, unspecified: Secondary | ICD-10-CM | POA: Insufficient documentation

## 2021-10-24 IMAGING — MR MR THORACIC SPINE WO/W CM
4 of 9 series · 17 of 48 positions shown · IV contrast (gadavist)
Comparison: Postoperative thoracic spine MRI [DATE].

CLINICAL DATA: 36-year-old male with history of multifactorial
thoracic T6-T7 spinal stenosis and cord compression with indurated
appearance of epidural fat on MRI in [REDACTED] and subsequent T5 through
T7 posterior decompression [DATE] where a small amount of pus
was encountered surgically in the right lateral recess at T6-T7.
Surgical cultures grew MSSA. Status post antibiotic therapy.

EXAM:
MRI THORACIC WITHOUT AND WITH CONTRAST
TECHNIQUE: Multiplanar and multiecho pulse sequences of the thoracic spine were
obtained without and with intravenous contrast.
CONTRAST:  10mL GADAVIST GADOBUTROL 1 MMOL/ML IV SOLN

[Series 2: T1 · sagittal · 3.0mm · 0.90mm/px · 3 of 12 slices shown (1 of 2)]
[im 1/12]
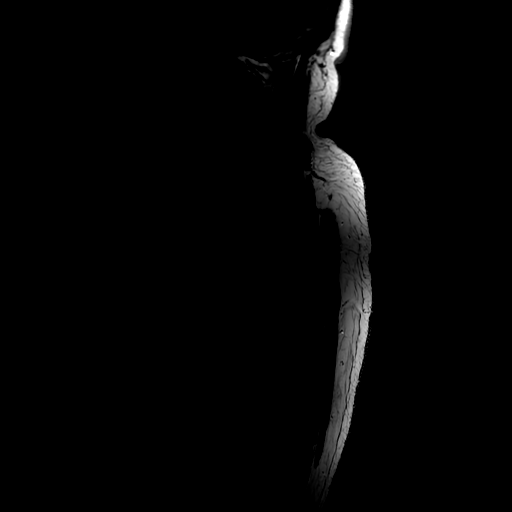
[im 8/12]
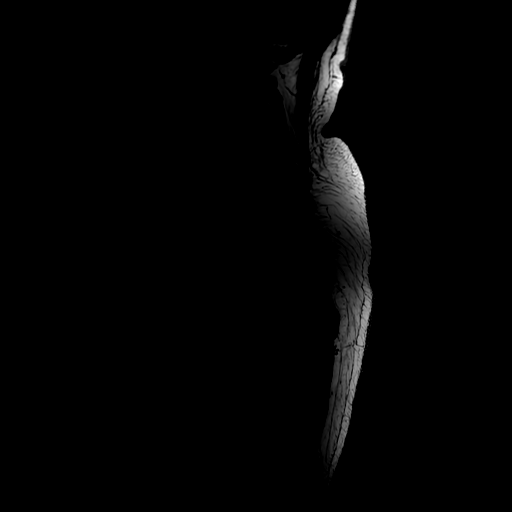
[im 12/12]
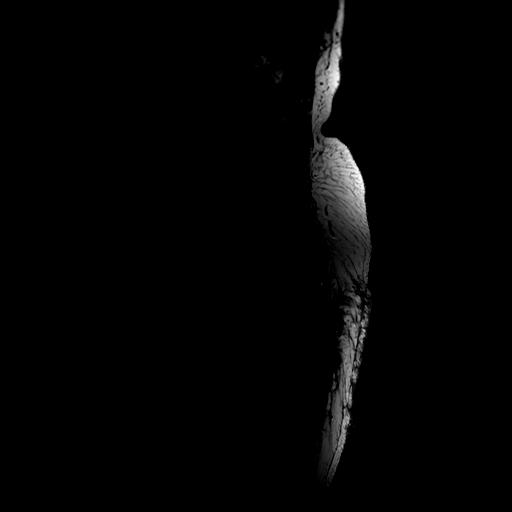

[Series 4: T2 · sagittal · 3.0mm · 0.66mm/px · 3 of 15 slices shown (1 of 2)]
[im 1/15]
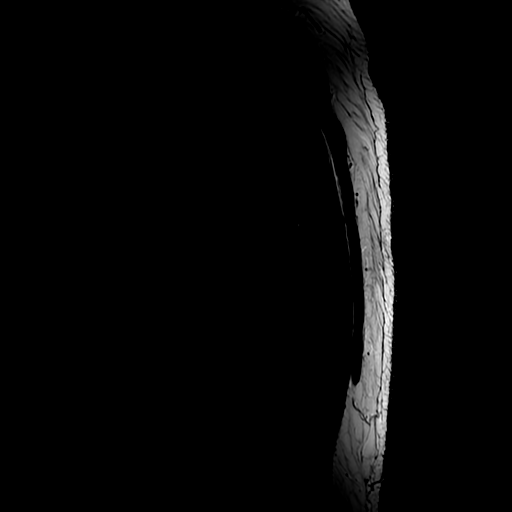
[im 8/15]
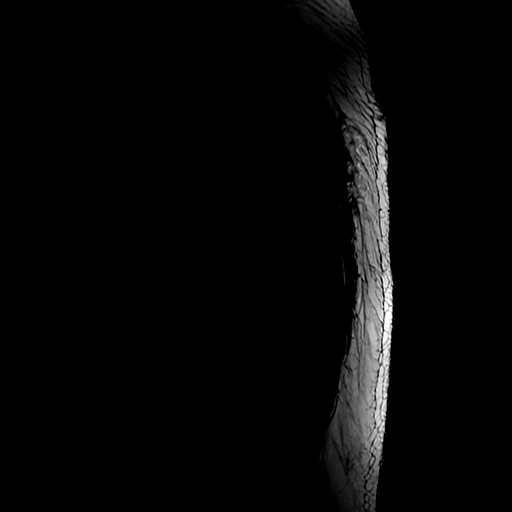
[im 15/15]
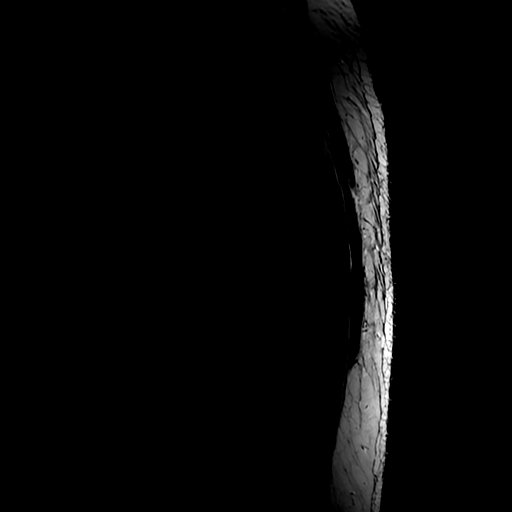

[Series 6: T1 · sagittal · 3.0mm · 0.66mm/px · 3 of 15 slices shown (2 of 2)]
[im 1/15]
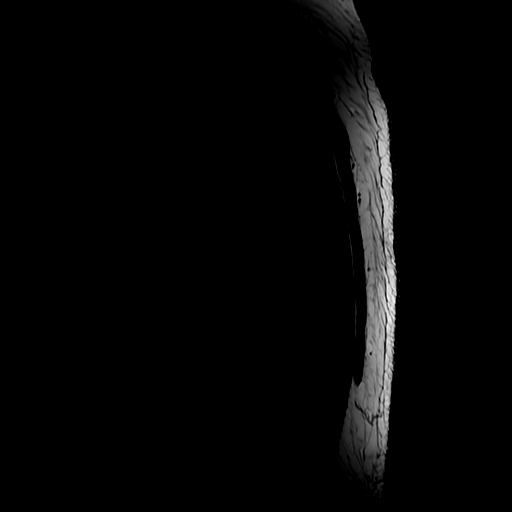
[im 8/15]
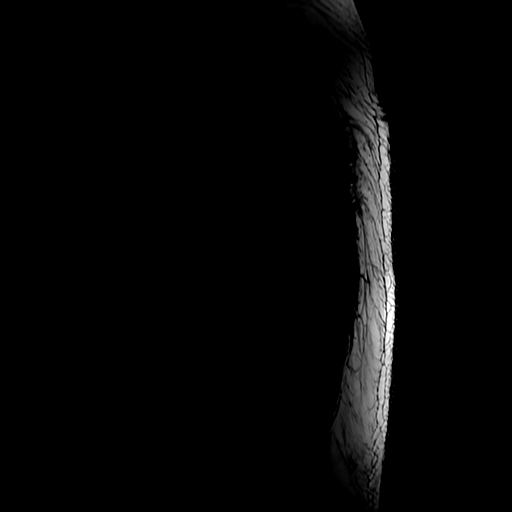
[im 15/15]
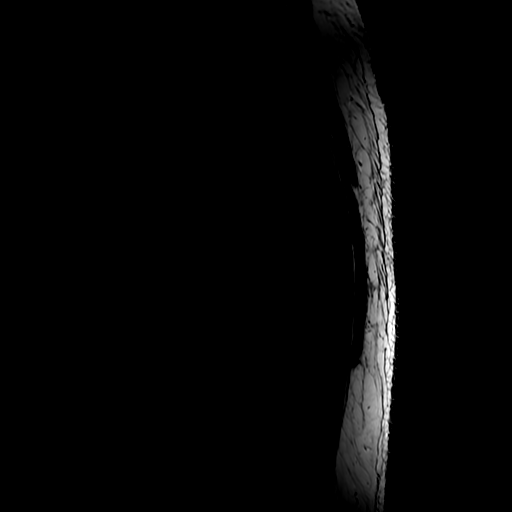

[Series 7: T2 · axial · 4.0mm · 0.39mm/px · z∈[-381,-122]mm · 8 of 39 slices shown (2 of 2)]
[im 1/39]
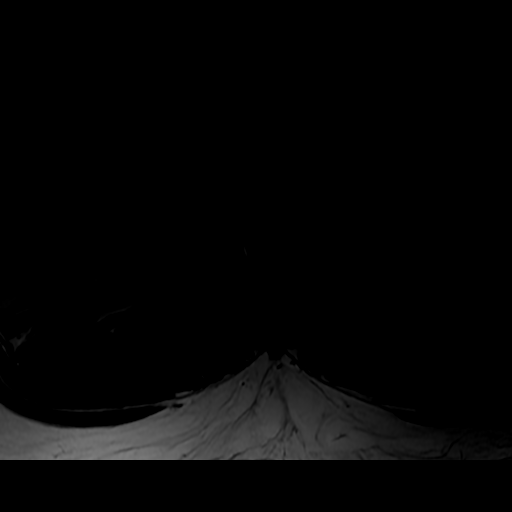
[im 6/39]
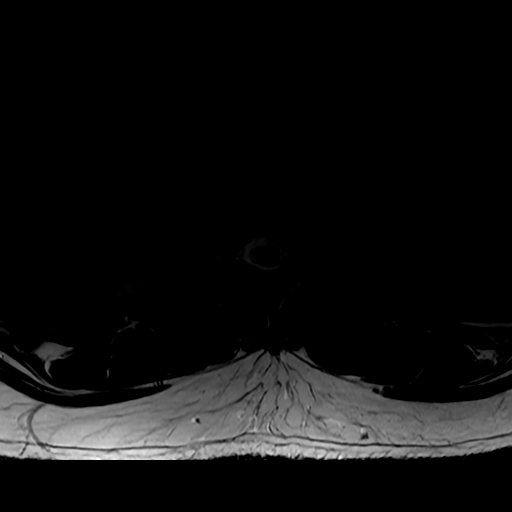
[im 11/39]
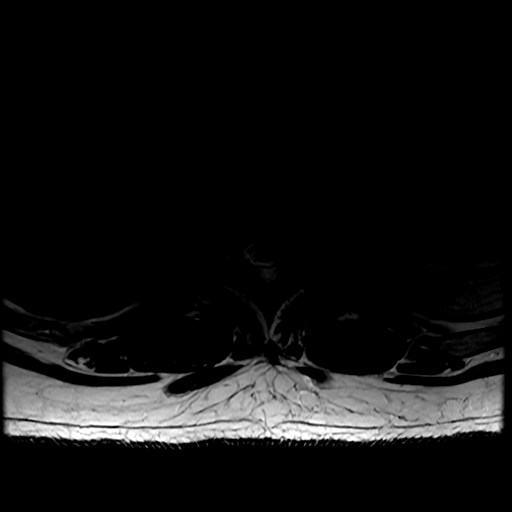
[im 17/39]
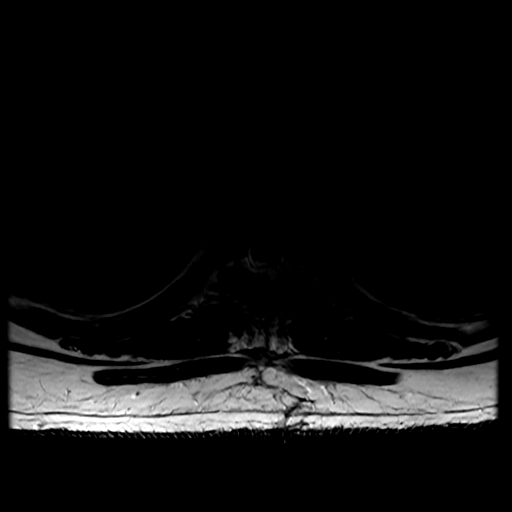
[im 22/39]
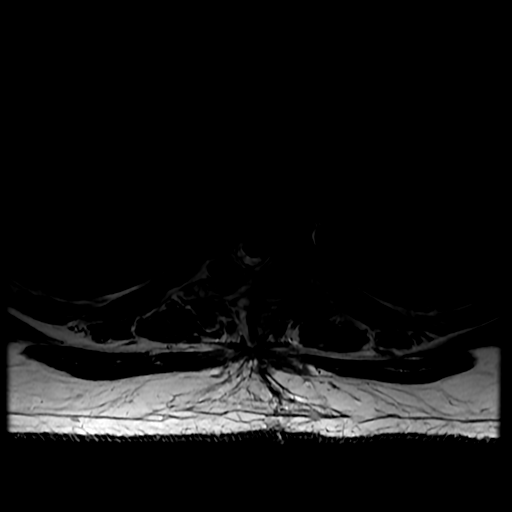
[im 28/39]
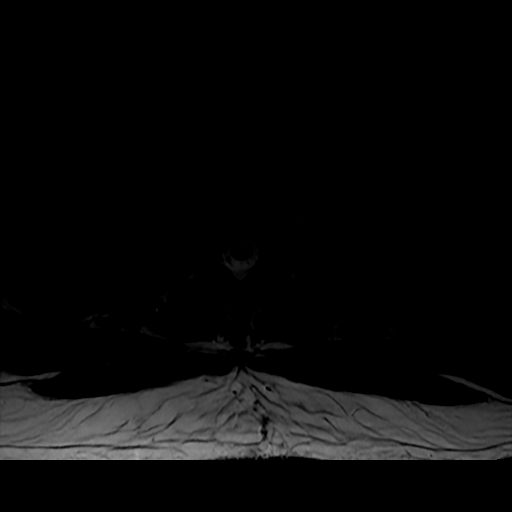
[im 33/39]
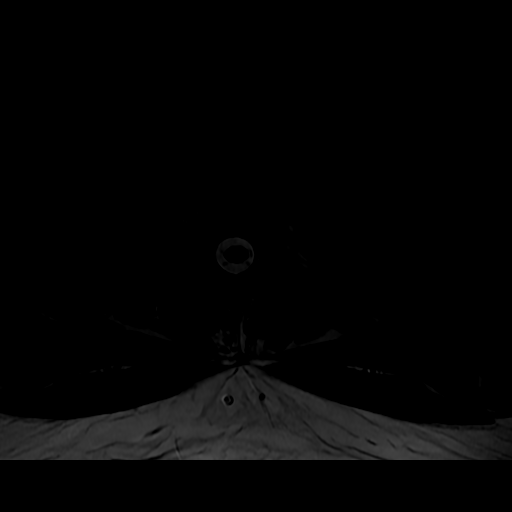
[im 39/39]
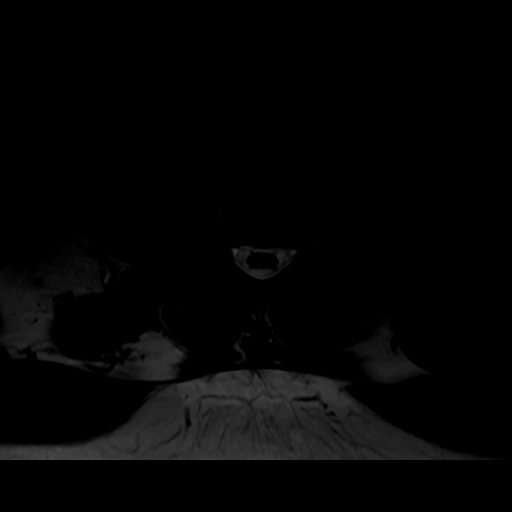

[17 of 48 positions shown; findings below may reference images not displayed]

FINDINGS: Limited cervical spine imaging: Negative aside from straightening of
cervical lordosis.

Thoracic spine segmentation: Normal, same numbering system used
previously.

Alignment: Stable thoracic kyphosis from the preoperative exam. No
spondylolisthesis.

Vertebrae: Postoperative changes to the posterior elements T5
through T7. No marrow edema or evidence of acute osseous
abnormality. Stable background bone marrow signal.

Cord: No thoracic spinal cord signal abnormality identified despite
residual midthoracic spinal stenosis, detailed below. No abnormal
intradural enhancement. No definite dural thickening.

Paraspinal and other soft tissues: Postoperative changes to the
posterior paraspinal soft tissues with confluent granulation tissue
tracking to the dorsal spinal canal T5 through T7. No paraspinal
fluid collection. Negative visible thoracic and upper abdominal
viscera.

Disc levels:

Cervicothoracic junction through T4-T5: Stable since [DATE],
negative.

T5-T6: Posterior decompression. Substantial regression in dorsal
epidural lipomatosis. Resolved spinal stenosis here. No foraminal
stenosis. Mild residual facet hypertrophy.

T6-T7: Improved thecal sac patency with mild residual spinal
stenosis (series 7, image 20). Posterior decompression and
substantially reduced epidural lipomatosis here with some epidural
granulation now. Moderate residual facet hypertrophy. Mild to
moderate bilateral T6 foraminal stenosis appears improved, still
greater on the right.

T7-T8: Partial posterior decompression. Substantially regressed
epidural lipomatosis with some epidural granulation now. Mild to
moderate residual facet hypertrophy greater on the left. Largely
resolved spinal stenosis (series 7, image 22). No convincing
foraminal stenosis.

T8-T9 and T9-T10: Stable epidural lipomatosis and subsequent mild
spinal stenosis.

T10-T11: Epidural lipomatosis abates at this level as before. Stable
mild left paracentral and subarticular disc bulging. Stable mild
left T10 foraminal stenosis.

T11-T12: Stable, negative.

T12-L1: Stable, negative.
IMPRESSION: 1. No evidence of active spinal infection, and improved spinal canal
T5 through T7 following posterior decompression. Substantially
reduced epidural lipomatosis at those levels with some epidural
granulation. Residual facet hypertrophy. Mild residual spinal
stenosis, T6 foraminal stenosis. No associated spinal cord signal
abnormality.

2. Other thoracic levels are stable since the preoperative MRI.
Underlying epidural lipomatosis, occasional disc degeneration.

## 2021-10-24 MED ORDER — GADOBUTROL 1 MMOL/ML IV SOLN
10.0000 mL | Freq: Once | INTRAVENOUS | Status: AC | PRN
  Administered 2021-10-24: 10 mL via INTRAVENOUS

## 2021-10-28 ENCOUNTER — Ambulatory Visit (INDEPENDENT_AMBULATORY_CARE_PROVIDER_SITE_OTHER): Payer: BC Managed Care – PPO | Admitting: Internal Medicine

## 2021-10-28 ENCOUNTER — Encounter: Payer: Self-pay | Admitting: Internal Medicine

## 2021-10-28 ENCOUNTER — Other Ambulatory Visit: Payer: Self-pay

## 2021-10-28 VITALS — BP 109/73 | HR 88 | Resp 16 | Ht 74.0 in | Wt 323.4 lb

## 2021-10-28 DIAGNOSIS — Z452 Encounter for adjustment and management of vascular access device: Secondary | ICD-10-CM

## 2021-10-28 DIAGNOSIS — A4901 Methicillin susceptible Staphylococcus aureus infection, unspecified site: Secondary | ICD-10-CM

## 2021-10-28 DIAGNOSIS — G062 Extradural and subdural abscess, unspecified: Secondary | ICD-10-CM

## 2021-10-28 NOTE — Progress Notes (Signed)
[facility] for Infectious Disease  CHIEF COMPLAINT:    Follow up for epidural abscess  SUBJECTIVE:    Cristian Grant is a 36 y.o. male with PMHx as below who presents to the clinic for epidural abscess.   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  He was taken to the OR 3/31 where purulence was unexpectedly encountered.  This was cultured and grew MSSA.  Blood cultures and TEE were negative.    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  No indications for repeat debridement per NSGY.  He had a PICC line placed and OPAT orders to receive Cefazolin through 5/12.  He spent a few days in rehab following hospital discharge where he continued to progress and was subsequently discharged home.  He followed up with myself on 10/13/21 and reported that he continued to progress with his recovery and pain was improved.  He is regaining more function daily.  He had follow up with NSGY  and reports will see Dr [name] again in July. He completed his IV antibiotics and PICC line subsequently removed.  He was transitioned to Cefadroxil 1gm BID for 2 more weeks to complete 8 total weeks of antibiotics and pending repeat spine MRI.  This MRI was done on 5/20 and was reassuring for no evidence of active spinal infection.  He is tolerating the cefadroxil and has no new complaints.   He reports no fevers, chills, diarrhea.  He is working on improving his ambulation and proprioception.  He ambulates with a cane right now.  He has completed home health PT and is waiting on approval for outpatient therapy.  Please see A&P for the  details of today's visit and status of the patient's medical problems.   Patient's Medications  New Prescriptions   No medications on file  Previous Medications   ACETAMINOPHEN (TYLENOL) 325 MG TABLET    Take 2 tablets (650 mg total) by mouth 3 (three) times daily.   CEFADROXIL (DURICEF) 1 G TABLET    Take 1 tablet (1 g total) by mouth 2 (two) times daily for 14 days.   MULTIPLE VITAMIN (MULTIVITAMIN WITH MINERALS) TABS TABLET    Take 1 tablet by mouth daily.   ROSUVASTATIN (CRESTOR) 10 MG TABLET    Take 1 tablet (10 mg total) by mouth daily.   VITAMIN D, ERGOCALCIFEROL, (DRISDOL) 1.25 MG (50000 UNIT) CAPS CAPSULE    Take 1 capsule (50,000 Units total) by mouth every 7 (seven) days.  Modified Medications   No medications on file  Discontinued Medications   GUAIFENESIN (MUCINEX) 600 MG 12 HR TABLET    Take 2 tablets (1,200 mg total) by mouth 2 (two) times daily as needed.   MELATONIN 5 MG TABS    Take 1 tablet (5 mg total) by mouth at bedtime as needed.   METHOCARBAMOL (ROBAXIN) 500 MG TABLET    Take 1 tablet (500 mg total) by mouth every 6 (six) hours as needed for muscle spasms.   OXYCODONE 10 MG TABS    Take 0.5-1 tablets (5-10 mg total) by mouth every 6 (six) hours as needed for severe pain.  SENNA-DOCUSATE (SENOKOT-S) 8.6-50 MG TABLET    Take 2 tablets by mouth at bedtime.   TAMSULOSIN (FLOMAX) 0.4 MG CAPS CAPSULE    Take 2 capsules (0.8 mg total) by mouth daily after supper.      Past Medical History:  Diagnosis Date   Hyperlipidemia     Social History   Tobacco Use   Smoking status: Never   Smokeless tobacco: Never  Vaping Use   Vaping Use: Never used  Substance Use Topics   Alcohol use: Yes   Drug use: Not Currently    Family History  Problem Relation Age of Onset   Diabetes Mother    Crohn's disease Mother    Crohn's disease Sister     No Known Allergies  Review of Systems  All other systems reviewed and are negative. Except as noted above.    OBJECTIVE:     Vitals:   10/28/21 1604  BP: 109/73  Pulse: 88  Resp: 16  SpO2: 94%  Weight: (!) 323 lb 6.4 oz (146.7 kg)  Height: 6\' 2"  (1.88 m)   Body mass index is 41.52 kg/m.  Physical Exam Constitutional:      General: He is not in acute distress.    Appearance: Normal appearance.  HENT:     Head: Normocephalic and atraumatic.  Eyes:     Extraocular Movements: Extraocular movements intact.     Conjunctiva/sclera: Conjunctivae normal.  Abdominal:     General: There is no distension.     Palpations: Abdomen is soft.  Musculoskeletal:     Cervical back: Normal range of motion and neck supple.     Right lower leg: No edema.     Left lower leg: No edema.     Comments: PICC line has been removed.   Skin:    General: Skin is warm and dry.     Findings: No rash.  Neurological:     Mental Status: He is alert.  Psychiatric:        Mood and Affect: Mood normal.        Behavior: Behavior normal.     Labs and Microbiology:    Latest Ref Rng & Units 09/16/2021    4:36 AM 09/14/2021    7:35 AM 09/09/2021    5:23 AM  CBC  WBC 4.0 - 10.5 K/uL 8.6   8.3   12.4    Hemoglobin 13.0 - 17.0 g/dL 11.8   12.2   11.9    Hematocrit 39.0 - 52.0 % 36.0   37.9   37.8    Platelets 150 - 400 K/uL 406   419   456        Latest Ref Rng & Units 09/18/2021    4:06 AM 09/16/2021    4:36 AM 09/14/2021    7:35 AM  CMP  Glucose 70 - 99 mg/dL 126   98   99    BUN 6 - 20 mg/dL 11   13   12     Creatinine 0.61 - 1.24 mg/dL 0.84   0.91   0.88    Sodium 135 - 145 mmol/L 133   135   135    Potassium 3.5 - 5.1 mmol/L 3.9   4.4   4.2    Chloride 98 - 111 mmol/L 103   104   103    CO2 22 - 32 mmol/L 25   24   24     Calcium 8.9 - 10.3 mg/dL 8.5   9.0  9.2    Total Protein 6.5 - 8.1 g/dL 7.2   7.6     Total Bilirubin 0.3 - 1.2 mg/dL 0.5   0.7     Alkaline Phos 38 - 126 U/L 72   70     AST 15 - 41 U/L 27   29     ALT 0 - 44 U/L 35   46         ASSESSMENT & PLAN:    Epidural abscess He is doing great today  which is reassuring.  We reviewed his MRI today and that there was no evidence of ongoing infection.  His inflammatory markers were also overall improved on 5/9 which is reassuring.  He completed 6 weeks of Ancef IV on 5/12 followed now by 2 more weeks of Cefadroxil PO for MSSA infection.  He will finish this in a couple days at which point will stop his antibiotics and observe.  Return precautions discussed and will follow up again in 2 months.  PICC (peripherally inserted central catheter) in place PICC line has been removed following IV antibiotics.    [facility] for Infectious Disease [facility] 10/28/2021, 4:26 PM    I have personally spent 30 minutes involved in face-to-face and non-face-to-face activities for this patient on the day of the visit. Professional time spent includes the following activities: Preparing to see the patient (review of tests), Obtaining and/or reviewing separately obtained history (admission/discharge record), Performing a medically appropriate examination and/or evaluation , Ordering medications/tests/procedures, referring and communicating with other health care professionals, Documenting clinical information in the EMR, Independently interpreting results (not separately reported), Communicating results to the patient/family/caregiver, Counseling and educating the patient/family/caregiver and Care coordination (not separately reported).

## 2021-10-28 NOTE — Assessment & Plan Note (Addendum)
He is doing great today which is reassuring.  We reviewed his MRI today and that there was no evidence of ongoing infection.  His inflammatory markers were also overall improved on 5/9 which is reassuring.  He completed 6 weeks of Ancef IV on 5/12 followed now by 2 more weeks of Cefadroxil PO for MSSA infection.  He will finish this in a couple days at which point will stop his antibiotics and observe.  Return precautions discussed and will follow up again in 2 months.

## 2021-10-28 NOTE — Patient Instructions (Signed)
Thank you for coming to see me today. It was a pleasure seeing you.  To Do: Continue cefadroxil oral antibiotic until they are gone then stop antibiotic Follow up with me in about 3 months  If you have any questions or concerns, please do not hesitate to call the office at [phone].  Take Care,   Jule Ser

## 2021-10-28 NOTE — Assessment & Plan Note (Signed)
PICC line has been removed following IV antibiotics.

## 2021-11-03 ENCOUNTER — Telehealth: Payer: Self-pay

## 2021-11-03 ENCOUNTER — Other Ambulatory Visit: Payer: Self-pay | Admitting: Physical Medicine and Rehabilitation

## 2021-11-03 NOTE — Telephone Encounter (Signed)
Patient called and stated he got his picc line out and was released from in home therapy. He would like to know if there are any recommendations for outpatient therapy

## 2021-11-04 ENCOUNTER — Encounter
Payer: BC Managed Care – PPO | Attending: Physical Medicine and Rehabilitation | Admitting: Physical Medicine and Rehabilitation

## 2021-11-04 DIAGNOSIS — G8918 Other acute postprocedural pain: Secondary | ICD-10-CM | POA: Diagnosis not present

## 2021-11-04 DIAGNOSIS — G9520 Unspecified cord compression: Secondary | ICD-10-CM | POA: Insufficient documentation

## 2021-11-05 NOTE — Progress Notes (Signed)
Subjective:    Patient ID: Cristian Grant, male    DOB: Jan 06, 1986, 36 y.o.   MRN: [id number]  HPI An audio/video tele-health visit is felt to be the most appropriate encounter for this patient at this time. This is a follow up tele-visit via phone. The patient is at home. MD is at office. Prior to scheduling this appointment, our staff discussed the limitations of evaluation and management by telemedicine and the availability of in-person appointments. The patient expressed understanding and agreed to proceed.   Cristian Grant is a 36 year old man who presents for hospital follow-up after CIR admission for epidural abscess with cord compression. -spinal pain is manageable- he is no longer taking any medications. Weaned off tylenol two days ago.  -Walks 250 to 500 feet per day. Can walk in the grocery store and to his mailbox and back. -asks about exercise for weight loss given his spinal restrictions. He followed up with NSGY recently and they lifted some restrictions but he is still not permitted to bend, twist, or lift -Has been trying to eat pecans and pistachios -Not moving bowels regularly with the pain meds -Trying to eat soups.  -He is having a bowel movement every other day -He feels numb aroung the rectum and bladder area and still cannot sense when he is going to have a bowel movement or urinate -he does not need any refills of his medications -he has been using his pain medications minimally.  -has graduated from home therapy, and asks whether he needs outpatient therapy.  Pain Inventory Average Pain 2 Pain Right Now 1 My pain is intermittent and aching  In the last 24 hours, has pain interfered with the following? General activity 0 Relation with others 0 Enjoyment of life 0 What TIME of day is your pain at its worst? evening Sleep (in general) Fair  Pain is worse with: bending and sitting Pain improves with: rest, therapy/exercise, and medication Relief from  Meds: 8  walk without assistance use a cane how many minutes can you walk? 5 ability to climb steps?  yes do you drive?  no Do you have any goals in this area?  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  no  Any changes since last visit?  no    Family History  Problem Relation Age of Onset   Diabetes Mother    Crohn's disease Mother    Crohn's disease Sister    Social History   Socioeconomic History   Marital status: Married    Spouse name: Not on file   Number of children: Not on file   Years of education: Not on file   Highest education level: Not on file  Occupational History   Not on file  Tobacco Use   Smoking status: Never   Smokeless tobacco: Never  Vaping Use   Vaping Use: Never used  Substance and Sexual Activity   Alcohol use: Yes   Drug use: Not Currently   Sexual activity: Not on file  Other Topics Concern   Not on file  Social History Narrative   Not on file   Social Determinants of Health   Financial Resource Strain: Not on file  Food Insecurity: Not on file  Transportation Needs: Not on file  Physical Activity: Not on file  Stress: Not on file  Social Connections:  Not on file   Past Surgical History:  Procedure Laterality Date   LUMBAR LAMINECTOMY/DECOMPRESSION MICRODISCECTOMY N/A 09/04/2021   Procedure: THORACIC FIVE, THORACIC SIX, THORACIC SEVEN LAMINECTOMY FOR DECOMPRESSION AND  RESECTION OF SYNOVIAL CYST;  Surgeon: Dawley, Theodoro Doing, DO;  Location: [place];  Service: Neurosurgery;  Laterality: N/A;   TEE WITHOUT CARDIOVERSION N/A 09/14/2021   Procedure: TRANSESOPHAGEAL ECHOCARDIOGRAM (TEE);  Surgeon: Acie Fredrickson Wonda Cheng, MD;  Location: [facility] ENDOSCOPY;  Service: Cardiovascular;  Laterality: N/A;   Past Medical History:  Diagnosis Date   Hyperlipidemia    There were no vitals taken for this  visit.  Opioid Risk Score:   Fall Risk Score:  `1  Depression screen [facility] 2/9     10/28/2021    4:05 PM 10/13/2021   10:53 AM 09/29/2021    3:38 PM  Depression screen PHQ 2/9  Decreased Interest 0 0 0  Down, Depressed, Hopeless 0 0 0  PHQ - 2 Score 0 0 0  Altered sleeping   0  Tired, decreased energy   1  Change in appetite   0  Feeling bad or failure about yourself    0  Trouble concentrating   0  Moving slowly or fidgety/restless   0  Suicidal thoughts   0  PHQ-9 Score   1  Difficult doing work/chores   Not difficult at all     Review of Systems  Constitutional: Negative.   HENT: Negative.    Eyes: Negative.   Respiratory: Negative.    Cardiovascular: Negative.   Gastrointestinal: Negative.   Endocrine: Negative.   Genitourinary: Negative.   Musculoskeletal:  Positive for back pain and gait problem.  Skin: Negative.   Allergic/Immunologic: Negative.   Neurological:  Positive for numbness.  Hematological: Negative.   Psychiatric/Behavioral: Negative.        Objective:   Physical Exam Phone visit Assessment & Plan:  Cristian Grant is a 36 year old man who presents for hospital follow-up of impaired mobility and ADLs secondary to epidural abscess.   1) Epidural abscess -completed antibiotics.  -prescribed outpatient therapy  2) Neurogenic bowel and bladder -provided education on spinal cord injury and nerve damage and how this can affect the nerves responsible for bowel and bladder function -discussed prognosis  3) Constipation:  -Provided list of following foods that help with constipation and highlighted a few: 1) prunes- contain high amounts of fiber.  2) apples- has a form of dietary fiber called pectin that accelerates stool movement and increases beneficial gut bacteria 3) pears- in addition to fiber, also high in fructose and sorbitol which have laxative effect 4) figs- contain an enzyme ficin which helps to speed colonic transit 5) kiwis- contain an enzyme  actinidin that improves gut motility and reduces constipation 6) oranges- rich in pectin (like apples) 7) grapefruits- contain a flavanol naringenin which has a laxative effect 8) vegetables- rich in fiber and also great sources of folate, vitamin C, and K 9) artichoke- high in inulin, prebiotic great for the microbiome 10) chicory- increases stool frequency and softness (can be added to coffee) 11) rhubarb- laxative effect 12) sweet potato- high fiber 13) beans, peas, and lentils- contain both soluble and insoluble fiber 14) chia seeds- improves intestinal health and gut flora 15) flaxseeds- laxative effect 16) whole grain rye bread- high in fiber 17) oat bran- high in soluble and insoluble fiber 18) kefir- softens stools -recommended to try at least one of these foods every day.  -drink 6-8 glasses of water  per day -walk regularly, especially after meals.     4) Obesity: -Educated that current weight is  323 lbs and current BMI is >41 -recommended eat nuts as snack. -recommended walking 15 minutes after every meal, then working up to 20 monites, then 30 minutes -Educated regarding health benefits of weight loss- for pain, general health, chronic disease prevention, immune health, mental health.  -Will monitor weight every visit.  -Consider Roobois tea daily.  -Discussed the benefits of intermittent fasting. -Discussed foods that can assist in weight loss: 1) leafy greens- high in fiber and nutrients 2) dark chocolate- improves metabolism (if prefer sweetened, best to sweeten with honey instead of sugar).  3) cruciferous vegetables- high in fiber and protein 4) full fat yogurt: high in healthy fat, protein, calcium, and probiotics 5) apples- high in a variety of phytochemicals 6) nuts- high in fiber and protein that increase feelings of fullness 7) grapefruit: rich in nutrients, antioxidants, and fiber (not to be taken with anticoagulation) 8) beans- high in protein and fiber 9)  salmon- has high quality protein and healthy fats 10) green tea- rich in polyphenols 11) eggs- rich in choline and vitamin D 12) tuna- high protein, boosts metabolism 13) avocado- decreases visceral abdominal fat 14) chicken (pasture raised): high in protein and iron 15) blueberries- reduce abdominal fat and cholesterol 16) whole grains- decreases calories retained during digestion, speeds metabolism 17) chia seeds- curb appetite 18) chilies- increases fat metabolism  -Discussed supplements that can be used:  1) Metatrim 400mg  BID 30 minutes before breakfast and dinner  2) Sphaeranthus indicus and Garcinia mangostana (combinations of these and #1 can be found in capsicum and zychrome  3) green coffee bean extract 400mg  twice per day or Irvingia (african mango) 150 to 300mg  twice per day.  9 minutes spent in discussion of his current balance deficits, discussing his concerns regarding exercise for wait loss given his restrictions.  5) Postoperative pain -Provided with a pain relief journal and discussed that it contains foods and lifestyle tips to naturally help to improve pain. Discussed that these lifestyle strategies are also very good for health unlike some medications which can have negative side effects. Discussed that the act of keeping a journal can be therapeutic and helpful to realize patterns what helps to trigger and alleviate pain.

## 2021-11-11 ENCOUNTER — Ambulatory Visit: Payer: BC Managed Care – PPO | Attending: Physical Medicine and Rehabilitation

## 2021-11-11 DIAGNOSIS — G9520 Unspecified cord compression: Secondary | ICD-10-CM | POA: Diagnosis present

## 2021-11-11 DIAGNOSIS — M6281 Muscle weakness (generalized): Secondary | ICD-10-CM | POA: Diagnosis present

## 2021-11-11 DIAGNOSIS — R262 Difficulty in walking, not elsewhere classified: Secondary | ICD-10-CM | POA: Diagnosis present

## 2021-11-11 NOTE — Therapy (Signed)
OUTPATIENT PHYSICAL THERAPY NEURO EVALUATION   Patient Name: Cristian Grant MRN: [id number] DOB:09-26-1985, 36 y.o., male Today's Date: 11/11/2021   PCP: Georgann Housekeeper, MD  REFERRING PROVIDER: Sula Soda, MD   PT End of Session - 11/11/21 1106     Visit Number 1    Number of Visits 9    Date for PT Re-Evaluation 12/09/21    Authorization Type BCBS    PT Start Time 1105    PT Stop Time 1145    PT Time Calculation (min) 40 min    Equipment Utilized During Treatment Other (comment)   SPC   Activity Tolerance Patient tolerated treatment well    Behavior During Therapy WFL for tasks assessed/performed             Past Medical History:  Diagnosis Date   Hyperlipidemia    Past Surgical History:  Procedure Laterality Date   LUMBAR LAMINECTOMY/DECOMPRESSION MICRODISCECTOMY N/A 09/04/2021   Procedure: THORACIC FIVE, THORACIC SIX, THORACIC SEVEN LAMINECTOMY FOR DECOMPRESSION AND  RESECTION OF SYNOVIAL CYST;  Surgeon: Bethann Goo, DO;  Location: [facility];  Service: Neurosurgery;  Laterality: N/A;   TEE WITHOUT CARDIOVERSION N/A 09/14/2021   Procedure: TRANSESOPHAGEAL ECHOCARDIOGRAM (TEE);  Surgeon: Elease Hashimoto Deloris Ping, MD;  Location: [facility] ENDOSCOPY;  Service: Cardiovascular;  Laterality: N/A;   Patient Active Problem List   Diagnosis Date Noted   PICC (peripherally inserted central catheter) in place 10/13/2021   Postoperative pain    Acute blood loss anemia 09/18/2021   Hyponatremia 09/18/2021   Cord compression (HCC)    Morbid obesity (HCC) 09/06/2021   Pure hypercholesterolemia 09/06/2021   Staph aureus infection 09/06/2021   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  Rationale for Evaluation and Treatment Rehabilitation  SUBJECTIVE:                                                                                                                                                                                               SUBJECTIVE STATEMENT: Patient reports he is doing well, just trying to acclimate to being out of the hospital.   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 Yes: NPRS scale: 1/10 Pain location: midback, around incision  Pain description: aching Aggravating factors: a lot of activity, walking longer >2 hrs Relieving factors: sit, rest, tylenol PRN   PRECAUTIONS: Back re-eval by MD 7/10 to potentially lift the  precautions  WEIGHT BEARING RESTRICTIONS No  FALLS: Has patient fallen in last 6 months? 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 He reports numbness to perineal area with difficulty sensing/controlling B/B  COORDINATION: Heel/shin: smaller ROM L LE Figure 8: incr difficulty L LE  EDEMA:  Intermittent swelling to B LE, resolved by elevation and PRN compression stocking use  MUSCLE TONE: WFL    POSTURE: rounded shoulders, forward head, and posterior pelvic tilt  LOWER EXTREMITY MMT:    MMT Right Eval Left Eval  Hip flexion 3+ 3  Hip extension    Hip abduction 4- 3+  Hip adduction 4 4  Hip internal rotation    Hip external rotation    Knee flexion 4- 4-  Knee extension 4- 4-  Ankle dorsiflexion 4- 3+   Ankle plantarflexion 4 4  Ankle inversion    Ankle eversion    (Blank rows = not tested)  BED MOBILITY:  Still adhering to log roll technique  TRANSFERS: Assistive device utilized: Single point cane  Sit to stand: Modified independence Stand to sit: Modified independence Chair to chair: Modified independence   STAIRS:  Level of Assistance: SBA  Stair Negotiation Technique: Step to Pattern Alternating Pattern  with Single Rail on Right  Number of Stairs: 12    Height of Stairs: 6   GAIT: Gait pattern: step through pattern, trunk flexed, and wide BOS Distance walked: 200' Assistive device utilized: Single point cane Level of assistance: Modified independence    TODAY'S TREATMENT:  Therex:   -Supine piriformis stretch (90* hip flex maintained)  -seated QL stretch    PATIENT EDUCATION: Education details: PT POC, back precautions, safe stretches, OM results Person educated: Patient Education method: Explanation and Demonstration Education comprehension: verbalized understanding   HOME EXERCISE PROGRAM: To be provided   GOALS: Goals reviewed with patient? Yes  SHORT TERM GOALS: Target date: 11/25/2021  Pt will be independent with HEP for improved dynamic and ambulatory balance  Baseline: to be provided  Goal status: INITIAL  2.  Pt will improve FGA to >/= 19/30 to demonstrate improved balance and reduced fall risk  Baseline: 14/30 Goal status: INITIAL  3.  Pt will improve gait speed to >/= .7263m/s to demonstrate improved community ambulation  Baseline: .3035m/s Goal status: INITIAL    LONG TERM GOALS: Target date: 12/09/2021  Pt will improve FGA to >/= 24/30 to demonstrate improved balance and reduced fall risk Baseline: 14/30 Goal status: INITIAL  2.  Pt will improve gait speed to >/= .8 m/sec to demonstrate improved community ambulation  Baseline: .9035m/s Goal status: INITIAL  3.  Patient will adhere to his back precautions 100% of the time with  his HEP Baseline: HEP to be established Goal status: INITIAL   ASSESSMENT:  CLINICAL IMPRESSION: Patient is a 36 y.o. 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 He participated in the following outcome measures:  Patient  demonstrates increased fall risk as noted by score of 14/30 on  Functional Gait Assessment.   <22/30 = predictive of falls, <20/30 = fall in 6 months, <18/30 = predictive of falls in PD MCID: 5 points stroke population, 4 points geriatric population (ANPTA Core Set of Outcome Measures for Adults with Neurologic Conditions, 2018). Gait speed was also assessed and patient was able to ambulate at .11m/s indicative of household ambulation and need for skilled intervention to minimize risk for falling.    OBJECTIVE IMPAIRMENTS Abnormal gait, decreased activity tolerance, decreased balance, decreased coordination, decreased endurance, decreased mobility, decreased strength, and impaired sensation.   ACTIVITY LIMITATIONS carrying, lifting, bending, sitting, standing, squatting, stairs, continence, locomotion level, and caring for others  PARTICIPATION LIMITATIONS: cleaning, driving, shopping, community activity, occupation, and yard work  PERSONAL FACTORS 1 comorbidity: hyperlipidemia  are also affecting patient's functional outcome.   REHAB POTENTIAL: Good  CLINICAL DECISION MAKING: Stable/uncomplicated  EVALUATION COMPLEXITY: Low  PLAN: PT FREQUENCY: 2x/week  PT DURATION: 4 weeks  PLANNED INTERVENTIONS: Therapeutic exercises, Therapeutic activity, Neuromuscular re-education, Balance training, Gait training, Patient/Family education, Joint mobilization, Stair training, DME instructions, Aquatic Therapy, Cryotherapy, Moist heat, scar mobilization, Manual therapy, and Re-evaluation  PLAN FOR NEXT SESSION: provide HEP, begin high level balance re-training    Westley Foots,  PT Westley Foots, PT, DPT, CBIS  11/11/2021, 12:03 PM

## 2021-11-13 ENCOUNTER — Ambulatory Visit: Payer: BC Managed Care – PPO

## 2021-11-13 DIAGNOSIS — R262 Difficulty in walking, not elsewhere classified: Secondary | ICD-10-CM | POA: Diagnosis not present

## 2021-11-13 DIAGNOSIS — M6281 Muscle weakness (generalized): Secondary | ICD-10-CM

## 2021-11-13 NOTE — Therapy (Signed)
OUTPATIENT PHYSICAL THERAPY TREATMENT NOTE   Patient Name: Cristian Grant MRN: [id number] DOB:04/07/1986, 36 y.o., male Today's Date: 11/13/2021  PCP: Georgann Housekeeper, MD REFERRING PROVIDER: Georgann Housekeeper, MD  END OF SESSION:   PT End of Session - 11/13/21 1012     Visit Number 2    Number of Visits 9    Date for PT Re-Evaluation 12/09/21    Authorization Type BCBS    PT Start Time 1010    PT Stop Time 1055    PT Time Calculation (min) 45 min    Equipment Utilized During Treatment Other (comment)   SPC   Activity Tolerance Patient tolerated treatment well    Behavior During Therapy WFL for tasks assessed/performed             Past Medical History:  Diagnosis Date   Hyperlipidemia    Past Surgical History:  Procedure Laterality Date   LUMBAR LAMINECTOMY/DECOMPRESSION MICRODISCECTOMY N/A 09/04/2021   Procedure: THORACIC FIVE, THORACIC SIX, THORACIC SEVEN LAMINECTOMY FOR DECOMPRESSION AND  RESECTION OF SYNOVIAL CYST;  Surgeon: Bethann Goo, DO;  Location: [facility] OR;  Service: Neurosurgery;  Laterality: N/A;   TEE WITHOUT CARDIOVERSION N/A 09/14/2021   Procedure: TRANSESOPHAGEAL ECHOCARDIOGRAM (TEE);  Surgeon: Elease Hashimoto Deloris Ping, MD;  Location: [facility] ENDOSCOPY;  Service: Cardiovascular;  Laterality: N/A;   Patient Active Problem List   Diagnosis Date Noted   PICC (peripherally inserted central catheter) in place 10/13/2021   Postoperative pain    Acute blood loss anemia 09/18/2021   Hyponatremia 09/18/2021   Cord compression (HCC)    Morbid obesity (HCC) 09/06/2021   Pure hypercholesterolemia 09/06/2021   Staph aureus infection 09/06/2021   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   PAIN:  Are you having pain? No   TODAY'S TREATMENT:  NMR:   -lunge onto Bosu ball 2x12  -progress to lunge onto Bosu ball 2x8+ 2# dumbell shoulder press   -rebounder, solid ground (WBOS/NBOS), foam (WBOS/NBOS)  Therex:  -4- way ankle Red theraband   -Minisquat-> progressing to Airex -Monster walks 4x10ft red theraband  -mini squat-> progress to on foam     PATIENT EDUCATION: Education details: HEP, exercise progressions Person educated: Patient Education method: Explanation and Demonstration Education comprehension: verbalized understanding     HOME EXERCISE PROGRAM: Access Code: [email or web address] URL: [email or web address] Date: 11/13/2021 Prepared by: Merry Lofty  Exercises - Long Sitting Ankle Eversion with Resistance  - 1 x daily - 7 x weekly - 3 sets - 10 reps - Long Sitting Ankle Inversion with Resistance  - 1 x daily - 7 x weekly - 3 sets - 10 reps - Long Sitting Ankle Dorsiflexion with Anchored Resistance  - 1 x daily - 7 x weekly - 3 sets - 10 reps - Long Sitting Ankle Plantar Flexion with Resistance  - 1 x daily - 7 x weekly - 3 sets - 10 reps - Forward Monster Walks  - 1 x daily - 7 x weekly - 3 sets - 10 reps - Backward Monster Walks  - 1 x daily - 7 x weekly - 3 sets - 10 reps - Lateral Monster Walk with Squat and  Resistance (BKA)  - 1 x daily - 7 x weekly - 3 sets - 10 reps - Mini Squat with Counter Support  - 1 x daily - 7 x weekly - 3 sets - 10 reps     GOALS: Goals reviewed with patient? Yes   SHORT TERM GOALS: Target date: 11/25/2021   Pt will be independent with HEP for improved dynamic and ambulatory balance  Baseline: to be provided  Goal status: INITIAL   2.  Pt will improve FGA to >/= 19/30 to demonstrate improved balance and reduced fall risk   Baseline: 14/30 Goal status: INITIAL   3.  Pt will improve gait  speed to >/= .45m/s to demonstrate improved community ambulation   Baseline: .3m/s Goal status: INITIAL       LONG TERM GOALS: Target date: 12/09/2021   Pt will improve FGA to >/= 24/30 to demonstrate improved balance and reduced fall risk Baseline: 14/30 Goal status: INITIAL   2.  Pt will improve gait speed to >/= .8 m/sec to demonstrate improved community ambulation   Baseline: .88m/s Goal status: INITIAL   3.  Patient will adhere to his back precautions 100% of the time with his HEP Baseline: HEP to be established Goal status: INITIAL     ASSESSMENT:   CLINICAL IMPRESSION: Patient seen for skilled PT session with emphasis on balance re-training and functional strength. He demonstrates appropriate, but weak ankle strategy and benefits from further ankle strengthening. Patient tolerating higher level balance and functional strengthening well. Continue with POC.      OBJECTIVE IMPAIRMENTS Abnormal gait, decreased activity tolerance, decreased balance, decreased coordination, decreased endurance, decreased mobility, decreased strength, and impaired sensation.    ACTIVITY LIMITATIONS carrying, lifting, bending, sitting, standing, squatting, stairs, continence, locomotion level, and caring for others   PARTICIPATION LIMITATIONS: cleaning, driving, shopping, community activity, occupation, and yard work   PERSONAL FACTORS 1 comorbidity: hyperlipidemia  are also affecting patient's functional outcome.    REHAB POTENTIAL: Good   CLINICAL DECISION MAKING: Stable/uncomplicated   EVALUATION COMPLEXITY: Low   PLAN: PT FREQUENCY: 2x/week   PT DURATION: 4 weeks   PLANNED INTERVENTIONS: Therapeutic exercises, Therapeutic activity, Neuromuscular re-education, Balance training, Gait training, Patient/Family education, Joint mobilization, Stair training, DME instructions, Aquatic Therapy, Cryotherapy, Moist heat, scar mobilization, Manual therapy, and Re-evaluation   PLAN FOR NEXT  SESSION: high level balance, outside walking     Westley Foots, PT Westley Foots, PT, DPT, CBIS  11/13/2021, 10:55 AM

## 2021-11-16 ENCOUNTER — Ambulatory Visit: Payer: BC Managed Care – PPO

## 2021-11-16 DIAGNOSIS — M6281 Muscle weakness (generalized): Secondary | ICD-10-CM

## 2021-11-16 DIAGNOSIS — R262 Difficulty in walking, not elsewhere classified: Secondary | ICD-10-CM

## 2021-11-16 NOTE — Therapy (Signed)
OUTPATIENT PHYSICAL THERAPY TREATMENT NOTE   Patient Name: Cristian Grant MRN: [id number] DOB:[birth year], 36 y.o., male Today's Date: 11/16/2021  PCP: Georgann Housekeeper, MD REFERRING PROVIDER: Georgann Housekeeper, MD  END OF SESSION:   PT End of Session - 11/16/21 0848     Visit Number 3    Number of Visits 9    Date for PT Re-Evaluation 12/09/21    Authorization Type BCBS    PT Start Time [phone]    PT Stop Time 0930    PT Time Calculation (min) 44 min    Activity Tolerance Patient tolerated treatment well    Behavior During Therapy [facility] for tasks assessed/performed             Past Medical History:  Diagnosis Date   Hyperlipidemia    Past Surgical History:  Procedure Laterality Date   LUMBAR LAMINECTOMY/DECOMPRESSION MICRODISCECTOMY N/A 09/04/2021   Procedure: THORACIC FIVE, THORACIC SIX, THORACIC SEVEN LAMINECTOMY FOR DECOMPRESSION AND  RESECTION OF SYNOVIAL CYST;  Surgeon: Bethann Goo, DO;  Location: [facility] OR;  Service: Neurosurgery;  Laterality: N/A;   TEE WITHOUT CARDIOVERSION N/A 09/14/2021   Procedure: TRANSESOPHAGEAL ECHOCARDIOGRAM (TEE);  Surgeon: Elease Hashimoto Deloris Ping, MD;  Location: [facility] ENDOSCOPY;  Service: Cardiovascular;  Laterality: N/A;   Patient Active Problem List   Diagnosis Date Noted   PICC (peripherally inserted central catheter) in place 10/13/2021   Postoperative pain    Acute blood loss anemia 09/18/2021   Hyponatremia 09/18/2021   Cord compression (HCC)    Morbid obesity (HCC) 09/06/2021   Pure hypercholesterolemia 09/06/2021   Staph aureus infection 09/06/2021   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 He does report muscle soreness, but no pain.   PAIN:  Are you having pain? No   TODAY'S TREATMENT:  NMR:   -Squats 3x8 on Bosu ball (B UE support progressing to no UE support)  -rebounder: NBOS, semi-tandem, SLS,  foam NBOS, semi-tandem , SLS c 2kg ball   -ambulatory toe tap  -ambulatory toe tap zig-zagging through cones  There ex:   -figure- 4 stretch, HS stretch   -supine figure-4 stretch   -SciFit hills level 2 x8 mins      PATIENT EDUCATION: Education details: progress through therapy, exercise modifications Person educated: Patient Education method: Medical illustrator Education comprehension: verbalized understanding     HOME EXERCISE PROGRAM: Access Code: [email or web address] URL: [email or web address] Date: 11/13/2021 Prepared by: Merry Lofty  Exercises - Long Sitting Ankle Eversion with Resistance  - 1 x daily - 7 x weekly - 3 sets - 10 reps - Long Sitting Ankle Inversion with Resistance  - 1 x daily - 7 x weekly - 3 sets - 10 reps - Long Sitting Ankle Dorsiflexion with Anchored Resistance  - 1 x daily - 7 x weekly - 3 sets - 10 reps - Long Sitting Ankle Plantar Flexion with Resistance  - 1 x daily - 7 x weekly - 3 sets - 10 reps - Forward Monster Walks  - 1 x daily - 7 x weekly - 3 sets - 10 reps - Backward Monster Walks  - 1 x daily - 7 x weekly - 3 sets -  10 reps - Lateral Monster Walk with Squat and Resistance (BKA)  - 1 x daily - 7 x weekly - 3 sets - 10 reps - Mini Squat with Counter Support  - 1 x daily - 7 x weekly - 3 sets - 10 reps     GOALS: Goals reviewed with patient? Yes   SHORT TERM GOALS: Target date: 11/25/2021   Pt will be independent with HEP for improved dynamic and ambulatory balance  Baseline: to be provided  Goal status: INITIAL   2.  Pt will improve FGA to >/= 19/30 to demonstrate improved balance and reduced fall risk   Baseline: 14/30 Goal status:  INITIAL   3.  Pt will improve gait speed to >/= .81m/s to demonstrate improved community ambulation   Baseline: .84m/s Goal status: INITIAL       LONG TERM GOALS: Target date: 12/09/2021   Pt will improve FGA to >/= 24/30 to demonstrate improved balance and reduced fall risk Baseline: 14/30 Goal status: INITIAL   2.  Pt will improve gait speed to >/= .8 m/sec to demonstrate improved community ambulation   Baseline: .58m/s Goal status: INITIAL   3.  Patient will adhere to his back precautions 100% of the time with his HEP Baseline: HEP to be established Goal status: INITIAL     ASSESSMENT:   CLINICAL IMPRESSION: Patient seen for skilled PT session with emphasis on balance re-training and functional strength. Patient improved ankle strategy abilities, as well as overall functional strength and endurance. Patient remains with deficits with SLS and reactionary balance in any position other than self-selected WBOS. Patient tolerating therapy well- continue with POC.      OBJECTIVE IMPAIRMENTS Abnormal gait, decreased activity tolerance, decreased balance, decreased coordination, decreased endurance, decreased mobility, decreased strength, and impaired sensation.    ACTIVITY LIMITATIONS carrying, lifting, bending, sitting, standing, squatting, stairs, continence, locomotion level, and caring for others   PARTICIPATION LIMITATIONS: cleaning, driving, shopping, community activity, occupation, and yard work   PERSONAL FACTORS 1 comorbidity: hyperlipidemia  are also affecting patient's functional outcome.    REHAB POTENTIAL: Good   CLINICAL DECISION MAKING: Stable/uncomplicated   EVALUATION COMPLEXITY: Low   PLAN: PT FREQUENCY: 2x/week   PT DURATION: 4 weeks   PLANNED INTERVENTIONS: Therapeutic exercises, Therapeutic activity, Neuromuscular re-education, Balance training, Gait training, Patient/Family education, Joint mobilization, Stair training, DME instructions, Aquatic  Therapy, Cryotherapy, Moist heat, scar mobilization, Manual therapy, and Re-evaluation   PLAN FOR NEXT SESSION: high level balance, outside walking, progress HEP?, scar mobilization      Westley Foots, PT Westley Foots, PT, DPT, CBIS  11/16/2021, 9:32 AM

## 2021-11-18 ENCOUNTER — Ambulatory Visit: Payer: BC Managed Care – PPO

## 2021-11-18 DIAGNOSIS — R262 Difficulty in walking, not elsewhere classified: Secondary | ICD-10-CM

## 2021-11-18 DIAGNOSIS — M6281 Muscle weakness (generalized): Secondary | ICD-10-CM

## 2021-11-18 NOTE — Therapy (Signed)
OUTPATIENT PHYSICAL THERAPY TREATMENT NOTE   Patient Name: Cristian Grant MRN: [id number] DOB:February 28, 1986, 36 y.o., male Today's Date: 11/18/2021  PCP: Georgann Housekeeper, MD REFERRING PROVIDER: Georgann Housekeeper, MD  END OF SESSION:   PT End of Session - 11/18/21 0850     Visit Number 4    Number of Visits 9    Date for PT Re-Evaluation 12/09/21    Authorization Type BCBS    PT Start Time 0848    PT Stop Time 0930    PT Time Calculation (min) 42 min    Activity Tolerance Patient tolerated treatment well    Behavior During Therapy [facility] for tasks assessed/performed             Past Medical History:  Diagnosis Date   Hyperlipidemia    Past Surgical History:  Procedure Laterality Date   LUMBAR LAMINECTOMY/DECOMPRESSION MICRODISCECTOMY N/A 09/04/2021   Procedure: THORACIC FIVE, THORACIC SIX, THORACIC SEVEN LAMINECTOMY FOR DECOMPRESSION AND  RESECTION OF SYNOVIAL CYST;  Surgeon: Bethann Goo, DO;  Location: [facility] OR;  Service: Neurosurgery;  Laterality: N/A;   TEE WITHOUT CARDIOVERSION N/A 09/14/2021   Procedure: TRANSESOPHAGEAL ECHOCARDIOGRAM (TEE);  Surgeon: Elease Hashimoto Deloris Ping, MD;  Location: [facility] ENDOSCOPY;  Service: Cardiovascular;  Laterality: N/A;   Patient Active Problem List   Diagnosis Date Noted   PICC (peripherally inserted central catheter) in place 10/13/2021   Postoperative pain    Acute blood loss anemia 09/18/2021   Hyponatremia 09/18/2021   Cord compression (HCC)    Morbid obesity (HCC) 09/06/2021   Pure hypercholesterolemia 09/06/2021   Staph aureus infection 09/06/2021   Epidural abscess 09/06/2021   Thoracic myelopathy 09/04/2021    REFERRING DIAG: G95.20 (ICD-10-CM) - Cord compression (HCC)  THERAPY DIAG:  Difficulty in walking, not elsewhere classified  Muscle weakness (generalized)  Rationale for Evaluation and Treatment Rehabilitation  PERTINENT HISTORY: 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   PAIN:  Are you having pain? No   TODAY'S TREATMENT:  Gait:   -fwd up hill c SPC and without   -lateral up hill, leading R, leading L   -marching band drills: diagonals, carioca, marching (fwd, backward)   -4 square drill simulating quick environmental changes in direction (fwd, lateral, NBOS)   Therex:   -scifit x8 mins    PATIENT EDUCATION: Education details: community navigation, scar mobilization (provided with handout)  Person educated: Patient Education method: Medical illustrator Education comprehension: verbalized understanding     HOME EXERCISE PROGRAM: Access Code: [email or web address] URL: [email or web address] Date: 11/13/2021 Prepared by: Merry Lofty  Exercises - Long Sitting Ankle Eversion with Resistance  - 1 x daily - 7 x weekly - 3 sets - 10 reps - Long Sitting Ankle Inversion with Resistance  - 1 x daily - 7 x weekly - 3 sets - 10 reps - Long Sitting Ankle Dorsiflexion with Anchored Resistance  - 1 x daily - 7 x weekly - 3 sets - 10 reps - Long Sitting Ankle Plantar Flexion with Resistance  - 1 x daily - 7 x weekly - 3 sets - 10 reps - Forward Monster Walks  - 1 x daily - 7 x weekly - 3 sets - 10 reps - Backward Monster Walks  - 1 x daily - 7 x weekly - 3 sets - 10 reps - Lateral Monster Walk with Squat and Resistance (BKA)  - 1  x daily - 7 x weekly - 3 sets - 10 reps - Mini Squat with Counter Support  - 1 x daily - 7 x weekly - 3 sets - 10 reps     GOALS: Goals reviewed with patient? Yes   SHORT TERM GOALS: Target date: 11/25/2021   Pt will be independent with HEP for improved dynamic and ambulatory balance  Baseline: to be provided  Goal status: INITIAL   2.  Pt will improve FGA to >/= 19/30 to demonstrate improved balance and reduced fall risk   Baseline: 14/30 Goal status: INITIAL   3.  Pt will improve gait speed to >/= .47m/s to  demonstrate improved community ambulation   Baseline: .69m/s Goal status: INITIAL      LONG TERM GOALS: Target date: 12/09/2021   Pt will improve FGA to >/= 24/30 to demonstrate improved balance and reduced fall risk Baseline: 14/30 Goal status: INITIAL   2.  Pt will improve gait speed to >/= .8 m/sec to demonstrate improved community ambulation   Baseline: .16m/s Goal status: INITIAL   3.  Patient will adhere to his back precautions 100% of the time with his HEP Baseline: HEP to be established Goal status: INITIAL     ASSESSMENT:   CLINICAL IMPRESSION: Patient seen for skilled PT session with emphasis on community gait training, agility and functional LE strength. Patient reporting feeling nervous ambulating over uneven terrain without SPC, but had no LOB. L LE remains functionally weaker than R LE and patient prefers to lead with R LE as able. Improving agility noted, but would benefit from further obstacle negotiation. PT providing patient with handout on scar mobilization. To be done for ~5 mins per day. Educating patient that some redness to the area is normal for <79mins. Patient verbalized understanding. Patient tolerating therapy well- continue with POC.      OBJECTIVE IMPAIRMENTS Abnormal gait, decreased activity tolerance, decreased balance, decreased coordination, decreased endurance, decreased mobility, decreased strength, and impaired sensation.    ACTIVITY LIMITATIONS carrying, lifting, bending, sitting, standing, squatting, stairs, continence, locomotion level, and caring for others   PARTICIPATION LIMITATIONS: cleaning, driving, shopping, community activity, occupation, and yard work   PERSONAL FACTORS 1 comorbidity: hyperlipidemia  are also affecting patient's functional outcome.    REHAB POTENTIAL: Good   CLINICAL DECISION MAKING: Stable/uncomplicated   EVALUATION COMPLEXITY: Low   PLAN: PT FREQUENCY: 2x/week   PT DURATION: 4 weeks   PLANNED  INTERVENTIONS: Therapeutic exercises, Therapeutic activity, Neuromuscular re-education, Balance training, Gait training, Patient/Family education, Joint mobilization, Stair training, DME instructions, Aquatic Therapy, Cryotherapy, Moist heat, scar mobilization, Manual therapy, and Re-evaluation   PLAN FOR NEXT SESSION: high level balance, obstacle negotiation in 4-square agility, marching band drills      Westley Foots, PT Westley Foots, PT, DPT, CBIS  11/18/2021, 9:30 AM

## 2021-11-23 ENCOUNTER — Ambulatory Visit: Payer: BC Managed Care – PPO

## 2021-11-25 ENCOUNTER — Ambulatory Visit: Payer: BC Managed Care – PPO

## 2021-11-25 DIAGNOSIS — R262 Difficulty in walking, not elsewhere classified: Secondary | ICD-10-CM

## 2021-11-25 DIAGNOSIS — M6281 Muscle weakness (generalized): Secondary | ICD-10-CM

## 2021-11-25 NOTE — Therapy (Signed)
OUTPATIENT PHYSICAL THERAPY TREATMENT NOTE   Patient Name: Cristian Grant MRN: [id number] DOB:Sep 16, 1985, 36 y.o., male Today's Date: 11/25/2021  PCP: Wenda Low, MD REFERRING PROVIDER: Wenda Low, MD  END OF SESSION:   PT End of Session - 11/25/21 0851     Visit Number 5    Number of Visits 9    Date for PT Re-Evaluation 12/09/21    Authorization Type BCBS    PT Start Time [phone]    PT Stop Time 0930    PT Time Calculation (min) 41 min    Activity Tolerance Patient tolerated treatment well    Behavior During Therapy [facility] for tasks assessed/performed             Past Medical History:  Diagnosis Date   Hyperlipidemia    Past Surgical History:  Procedure Laterality Date   LUMBAR LAMINECTOMY/DECOMPRESSION MICRODISCECTOMY N/A 09/04/2021   Procedure: THORACIC FIVE, THORACIC SIX, THORACIC SEVEN LAMINECTOMY FOR DECOMPRESSION AND  RESECTION OF SYNOVIAL CYST;  Surgeon: Karsten Ro, DO;  Location: [place];  Service: Neurosurgery;  Laterality: N/A;   TEE WITHOUT CARDIOVERSION N/A 09/14/2021   Procedure: TRANSESOPHAGEAL ECHOCARDIOGRAM (TEE);  Surgeon: Acie Fredrickson Wonda Cheng, MD;  Location: [facility] ENDOSCOPY;  Service: Cardiovascular;  Laterality: N/A;   Patient Active Problem List   Diagnosis Date Noted   PICC (peripherally inserted central catheter) in place 10/13/2021   Postoperative pain    Acute blood loss anemia 09/18/2021   Hyponatremia 09/18/2021   Cord compression ([place])    Morbid obesity ([place]) 09/06/2021   Pure hypercholesterolemia 09/06/2021   Staph aureus infection 09/06/2021   Epidural abscess 09/06/2021   Thoracic myelopathy 09/04/2021    REFERRING DIAG: G95.20 (ICD-10-CM) - Cord compression ([place])  THERAPY DIAG:  Difficulty in walking, not elsewhere classified  Muscle weakness (generalized)  Rationale for Evaluation and Treatment Rehabilitation  PERTINENT HISTORY: 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   PAIN:  Are you having pain? No   TODAY'S TREATMENT:  TherAct:   - Goal assessment   -Gait speed: .33ms no AD  -  [facility] PT Assessment - 11/25/21 0001       Functional Gait  Assessment   Gait assessed  Yes    Gait Level Surface Walks 20 ft in less than 5.5 sec, no assistive devices, good speed, no evidence for imbalance, normal gait pattern, deviates no more than 6 in outside of the 12 in walkway width.    Change in Gait Speed Able to smoothly change walking speed without loss of balance or gait deviation. Deviate no more than 6 in outside of the 12 in walkway width.    Gait with Horizontal Head Turns Performs head turns smoothly with no change in gait. Deviates no more than 6 in outside 12 in walkway width    Gait with Vertical Head Turns Performs head turns with no change in gait. Deviates no more than 6 in outside 12 in walkway width.    Gait and Pivot Turn Pivot turns safely in greater than 3 sec and stops with no loss of balance, or pivot turns safely within 3 sec and stops with mild imbalance, requires small steps to catch balance.    Step Over Obstacle Is able to step over 2 stacked shoe boxes taped together (9 in total height) without changing gait speed. No evidence of imbalance.    Gait with Narrow Base of Support Is  able to ambulate for 10 steps heel to toe with no staggering.    Gait with Eyes Closed Walks 20 ft, uses assistive device, slower speed, mild gait deviations, deviates 6-10 in outside 12 in walkway width. Ambulates 20 ft in less than 9 sec but greater than 7 sec.    Ambulating Backwards Walks 20 ft, uses assistive device, slower speed, mild gait deviations, deviates 6-10 in outside 12 in walkway width.    Steps Alternating feet, must use rail.    Total Score 26           -ambulating over uneven surfaces with 3# ankle weights, obstacle course  Therex:   -scifit x8 mins    PATIENT EDUCATION: Education  details: community navigation, scar mobilization (provided with handout)  Person educated: Patient Education method: Customer service manager Education comprehension: verbalized understanding     HOME EXERCISE PROGRAM: Access Code: [email or web address] URL: [email or web address] Date: 11/13/2021 Prepared by: Estevan Ryder  Exercises - Long Sitting Ankle Eversion with Resistance  - 1 x daily - 7 x weekly - 3 sets - 10 reps - Long Sitting Ankle Inversion with Resistance  - 1 x daily - 7 x weekly - 3 sets - 10 reps - Long Sitting Ankle Dorsiflexion with Anchored Resistance  - 1 x daily - 7 x weekly - 3 sets - 10 reps - Long Sitting Ankle Plantar Flexion with Resistance  - 1 x daily - 7 x weekly - 3 sets - 10 reps - Forward Monster Walks  - 1 x daily - 7 x weekly - 3 sets - 10 reps - Backward Monster Walks  - 1 x daily - 7 x weekly - 3 sets - 10 reps - Lateral Monster Walk with Squat and Resistance (BKA)  - 1 x daily - 7 x weekly - 3 sets - 10 reps - Mini Squat with Counter Support  - 1 x daily - 7 x weekly - 3 sets - 10 reps     GOALS: Goals reviewed with patient? Yes   SHORT TERM GOALS: Target date: 11/25/2021   Pt will be independent with HEP for improved dynamic and ambulatory balance  Baseline: provided Goal status: MET   2.  Pt will improve FGA to >/= 19/30 to demonstrate improved balance and reduced fall risk   Baseline: 14/30; 26/30 Goal status: MET   3.  Pt will improve gait speed to >/= .76ms to demonstrate improved community ambulation   Baseline: .2930m; .30m32mGoal status: MET     LONG TERM GOALS: Target date: 12/09/2021   Pt will improve FGA to >/= 24/30 to demonstrate improved balance and reduced fall risk Baseline: 14/30 Goal status: INITIAL   2.  Pt will improve gait speed to >/= .8 m/sec to demonstrate improved community ambulation   Baseline: .30m56moal status: INITIAL   3.  Patient will adhere to his back precautions 100% of the time with  his HEP Baseline: HEP to be established Goal status: INITIAL     ASSESSMENT:   CLINICAL IMPRESSION: Patient seen for skilled PT session with emphasis on goal assessment and higher level ambulatory balance. Patient met 3/3 STG and even surpassed some of his LTGs. Patient demonstrates increased fall risk as noted by score of 26/30 on  Functional Gait Assessment.   <22/30 = predictive of falls, <20/30 = fall in 6 months, <18/30 = predictive of falls in PD MCID: 5 points stroke population, 4 points geriatric population (ANPTA Core Set of Outcome Measures for  Adults with Neurologic Conditions, 2018). 10 Meter Walk Test: Patient instructed to walk 10 meters (32.8 ft) as quickly and as safely as possible at their normal speed  Average Normal speed: .5 m/s Cut off scores: <0.4 m/s = household Ambulator, 0.4-0.8 m/s = limited community Ambulator, >0.8 m/s = community Ambulator, >1.2 m/s = crossing a street, <1.0 = increased fall risk MCID 0.05 m/s (small), 0.13 m/s (moderate), 0.06 m/s (significant)  (ANPTA Core Set of Outcome Measures for Adults with Neurologic Conditions, 2018). Patient ambulating over uneven surfaces challenging balance with 3# ankle weights. Tolerating well- continue POC.        OBJECTIVE IMPAIRMENTS Abnormal gait, decreased activity tolerance, decreased balance, decreased coordination, decreased endurance, decreased mobility, decreased strength, and impaired sensation.    ACTIVITY LIMITATIONS carrying, lifting, bending, sitting, standing, squatting, stairs, continence, locomotion level, and caring for others   PARTICIPATION LIMITATIONS: cleaning, driving, shopping, community activity, occupation, and yard work   PERSONAL FACTORS 1 comorbidity: hyperlipidemia  are also affecting patient's functional outcome.    REHAB POTENTIAL: Good   CLINICAL DECISION MAKING: Stable/uncomplicated   EVALUATION COMPLEXITY: Low   PLAN: PT FREQUENCY: 2x/week   PT DURATION: 4 weeks    PLANNED INTERVENTIONS: Therapeutic exercises, Therapeutic activity, Neuromuscular re-education, Balance training, Gait training, Patient/Family education, Joint mobilization, Stair training, DME instructions, Aquatic Therapy, Cryotherapy, Moist heat, scar mobilization, Manual therapy, and Re-evaluation   PLAN FOR NEXT SESSION: plan to recert on Friday    Debbora Dus, PT Debbora Dus, PT, DPT, CBIS  11/25/2021, 9:32 AM

## 2021-11-27 ENCOUNTER — Ambulatory Visit: Payer: BC Managed Care – PPO

## 2021-11-27 DIAGNOSIS — M6281 Muscle weakness (generalized): Secondary | ICD-10-CM

## 2021-11-27 DIAGNOSIS — R262 Difficulty in walking, not elsewhere classified: Secondary | ICD-10-CM

## 2021-11-30 ENCOUNTER — Ambulatory Visit: Payer: BC Managed Care – PPO

## 2021-12-02 ENCOUNTER — Ambulatory Visit: Payer: BC Managed Care – PPO

## 2021-12-14 ENCOUNTER — Ambulatory Visit: Payer: BC Managed Care – PPO

## 2021-12-15 ENCOUNTER — Ambulatory Visit: Payer: BC Managed Care – PPO | Attending: Physical Medicine and Rehabilitation

## 2021-12-15 DIAGNOSIS — M6281 Muscle weakness (generalized): Secondary | ICD-10-CM | POA: Diagnosis present

## 2021-12-15 DIAGNOSIS — R262 Difficulty in walking, not elsewhere classified: Secondary | ICD-10-CM | POA: Insufficient documentation

## 2021-12-15 NOTE — Therapy (Signed)
OUTPATIENT PHYSICAL THERAPY TREATMENT NOTE   Patient Name: Cristian Grant MRN: [id number] DOB:Sep 09, 1985, 36 y.o., male Today's Date: 12/15/2021  PCP: Wenda Low, MD REFERRING PROVIDER: Wenda Low, MD  END OF SESSION:   PT End of Session - 12/15/21 0854     Visit Number 7    Number of Visits 17    Date for PT Re-Evaluation 01/15/22    Authorization Type BCBS    PT Start Time [phone]   patient late   PT Stop Time 0932    PT Time Calculation (min) 40 min    Activity Tolerance Patient tolerated treatment well    Behavior During Therapy [facility] for tasks assessed/performed             Past Medical History:  Diagnosis Date   Hyperlipidemia    Past Surgical History:  Procedure Laterality Date   LUMBAR LAMINECTOMY/DECOMPRESSION MICRODISCECTOMY N/A 09/04/2021   Procedure: THORACIC FIVE, THORACIC SIX, THORACIC SEVEN LAMINECTOMY FOR DECOMPRESSION AND  RESECTION OF SYNOVIAL CYST;  Surgeon: Karsten Ro, DO;  Location: [place];  Service: Neurosurgery;  Laterality: N/A;   TEE WITHOUT CARDIOVERSION N/A 09/14/2021   Procedure: TRANSESOPHAGEAL ECHOCARDIOGRAM (TEE);  Surgeon: Acie Fredrickson Wonda Cheng, MD;  Location: [facility] ENDOSCOPY;  Service: Cardiovascular;  Laterality: N/A;   Patient Active Problem List   Diagnosis Date Noted   PICC (peripherally inserted central catheter) in place 10/13/2021   Postoperative pain    Acute blood loss anemia 09/18/2021   Hyponatremia 09/18/2021   Cord compression ([place])    Morbid obesity ([place]) 09/06/2021   Pure hypercholesterolemia 09/06/2021   Staph aureus infection 09/06/2021   Epidural abscess 09/06/2021   Thoracic myelopathy 09/04/2021    REFERRING DIAG: G95.20 (ICD-10-CM) - Cord compression ([place])  THERAPY DIAG:  Difficulty in walking, not elsewhere classified  Muscle weakness (generalized)  Rationale for Evaluation and Treatment Rehabilitation  PERTINENT HISTORY: 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 Back precautions lifted per patient.  PAIN:  Are you having pain? No   TODAY'S TREATMENT:  Theract: -oswestry: 7 (mild disability)  -4square step test (4 laps): 16.03s -FABscale: 33/40 -HiMAT: 13/54    PATIENT EDUCATION: Education details: return to exercise Person educated: Patient Education method: Customer service manager Education comprehension: verbalized understanding     HOME EXERCISE PROGRAM: Access Code: [email or web address] URL: [email or web address] Date: 11/13/2021 Prepared by: Estevan Ryder  Exercises - Long Sitting Ankle Eversion with Resistance  - 1 x daily - 7 x weekly - 3 sets - 10 reps - Long Sitting Ankle Inversion with Resistance  - 1 x daily - 7 x weekly - 3 sets - 10 reps - Long Sitting Ankle Dorsiflexion with Anchored Resistance  - 1 x daily - 7 x weekly - 3 sets - 10 reps - Long Sitting Ankle Plantar Flexion with Resistance  - 1 x daily - 7 x weekly - 3 sets - 10 reps - Forward Monster Walks  - 1 x daily - 7 x weekly - 3 sets - 10 reps - Backward Monster Walks  - 1 x daily - 7 x weekly - 3 sets - 10 reps - Lateral Monster Walk with Squat and Resistance (BKA)  - 1 x daily - 7 x weekly - 3 sets - 10 reps - Mini Squat with Counter Support  - 1 x daily - 7 x weekly - 3 sets - 10 reps     GOALS: Goals  reviewed with patient? Yes   SHORT TERM GOALS: Target date: 11/25/2021   Pt will be independent with HEP for improved dynamic and ambulatory balance  Baseline: provided Goal status: MET   2.  Pt will improve FGA to >/= 19/30 to demonstrate improved balance and reduced fall risk   Baseline: 14/30; 26/30 Goal status: MET   3.  Pt will improve gait speed to >/= .42ms to demonstrate improved community ambulation   Baseline: .267m; .36m36mGoal status: MET     LONG TERM GOALS: Target date: 12/09/2021   Pt will improve FGA to >/= 24/30 to demonstrate improved  balance and reduced fall risk Baseline: 14/30 Goal status: INITIAL   2.  Pt will improve gait speed to >/= .8 m/sec to demonstrate improved community ambulation   Baseline: .26m52moal status: INITIAL   3.  Patient will adhere to his back precautions 100% of the time with his HEP Baseline: HEP to be established Goal status: INITIAL  NEW LONG TERM GOALS: 01/15/22  1. Pt will be independent with FINAL HEP for improved balance and functional strength with upgraded precautions Baseline: exercises to be added to HEP once precautions lifted Goal status: INITIAL  2. Patient will be able to lift 25# from the floor with no increase in pain Baseline: unable to do so based on precautions Goal status: INITIAL   3. Patient will  ambulate at least 1900ft14fing 6MWT using LRAD  Baseline: to be assessed   Goal status: INITIAL  4. Patient will improve HiMAT score to >/=22/54  Baseline: 13/54  Goal status: NEW  5. Patient will improve Oswestry score to </= 4 to indicate no disability related to back pain  Baseline: 7  Goal status: NEW      ASSESSMENT:   CLINICAL IMPRESSION: Patient seen for skilled PT session with emphasis on OM assessment given that back precautions have been lifted. He scored a 7 on the Oswestry indicating mild disability due to back pain. Patient completing 4 laps of 4-square step test in 16.03s indicating decreased agility. He scored a 7 on the Oswestry questionnaire indicating mild disability related to back pain. Patient scoring a 33/40 on the [facility] scale, which indicates a decreased risk for falling, but patient likely demonstrating ceiling effect of this test. He [facility] the [facility] is traditionally used for individuals with a brain injury, however, PT unable to find appropriate SCI high-level balance OM appropriate for patient. He scored a 13/54 on the HiMAT. He was able to lift a 10# kettlebell off the floor with squat technique  without increase in pain. Continue POC.     OBJECTIVE IMPAIRMENTS Abnormal gait, decreased activity tolerance, decreased balance, decreased coordination, decreased endurance, decreased mobility, decreased strength, and impaired sensation.    ACTIVITY LIMITATIONS carrying, lifting, bending, sitting, standing, squatting, stairs, continence, locomotion level, and caring for others   PARTICIPATION LIMITATIONS: cleaning, driving, shopping, community activity, occupation, and yard work   PERSONAL FACTORS 1 comorbidity: hyperlipidemia  are also affecting patient's functional outcome.    REHAB POTENTIAL: Good   CLINICAL DECISION MAKING: Stable/uncomplicated   EVALUATION COMPLEXITY: Low   PLAN: PT FREQUENCY: 2x/week   PT DURATION: 10 weeks   PLANNED INTERVENTIONS: Therapeutic exercises, Therapeutic activity, Neuromuscular re-education, Balance training, Gait training, Patient/Family education, Joint mobilization, Stair training, DME instructions, Aquatic Therapy, Cryotherapy, Moist heat, scar mobilization, Manual therapy, and Re-evaluation   PLAN FOR NEXT SESSION: elliptical, russian twists, 6MWT, deep squat/lifting   JenniDebbora Dus  Debbora Dus, PT, DPT, CBIS  12/15/2021, 12:16 PM

## 2021-12-17 ENCOUNTER — Ambulatory Visit: Payer: BC Managed Care – PPO

## 2021-12-22 ENCOUNTER — Ambulatory Visit: Payer: BC Managed Care – PPO

## 2021-12-22 DIAGNOSIS — R262 Difficulty in walking, not elsewhere classified: Secondary | ICD-10-CM

## 2021-12-22 DIAGNOSIS — M6281 Muscle weakness (generalized): Secondary | ICD-10-CM

## 2021-12-22 NOTE — Therapy (Signed)
OUTPATIENT PHYSICAL THERAPY TREATMENT NOTE   Patient Name: Cristian Grant MRN: [id number] DOB:Jul 07, 1985, 36 y.o., male Today's Date: 12/22/2021  PCP: Wenda Low, MD REFERRING PROVIDER: Wenda Low, MD  END OF SESSION:   PT End of Session - 12/22/21 0853     Visit Number 8    Number of Visits 17    Date for PT Re-Evaluation 01/15/22    Authorization Type BCBS    PT Start Time [phone]   patient late   PT Stop Time 0930    PT Time Calculation (min) 39 min    Activity Tolerance Patient tolerated treatment well    Behavior During Therapy [facility] for tasks assessed/performed             Past Medical History:  Diagnosis Date   Hyperlipidemia    Past Surgical History:  Procedure Laterality Date   LUMBAR LAMINECTOMY/DECOMPRESSION MICRODISCECTOMY N/A 09/04/2021   Procedure: THORACIC FIVE, THORACIC SIX, THORACIC SEVEN LAMINECTOMY FOR DECOMPRESSION AND  RESECTION OF SYNOVIAL CYST;  Surgeon: Karsten Ro, DO;  Location: [place];  Service: Neurosurgery;  Laterality: N/A;   TEE WITHOUT CARDIOVERSION N/A 09/14/2021   Procedure: TRANSESOPHAGEAL ECHOCARDIOGRAM (TEE);  Surgeon: Acie Fredrickson Wonda Cheng, MD;  Location: [facility] ENDOSCOPY;  Service: Cardiovascular;  Laterality: N/A;   Patient Active Problem List   Diagnosis Date Noted   PICC (peripherally inserted central catheter) in place 10/13/2021   Postoperative pain    Acute blood loss anemia 09/18/2021   Hyponatremia 09/18/2021   Cord compression ([place])    Morbid obesity ([place]) 09/06/2021   Pure hypercholesterolemia 09/06/2021   Staph aureus infection 09/06/2021   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 Feels as though jumping aggravated his back contributing to some increase in pain/stiffness.   PAIN:  Are you having pain? No   TODAY'S TREATMENT:  Therex:  -quadruped thread the needle  -childs pose -straddle stretch  -elliptical level 2.5 x4 mins -hook punching to bag standing on foam 3x45s  -sumo squat 15# kettlebell 2x15 -marching drills holding 5# kettlebell -> standing on foam  -supine figure-4 stretch  -ant ball roll out stretch    PATIENT EDUCATION: Education details: return to exercise Person educated: Patient Education method: Customer service manager Education comprehension: verbalized understanding     HOME EXERCISE PROGRAM: Access Code: [email or web address] URL: [email or web address] Date: 11/13/2021 Prepared by: Estevan Ryder  Exercises - Long Sitting Ankle Eversion with Resistance  - 1 x daily - 7 x weekly - 3 sets - 10 reps - Long Sitting Ankle Inversion with Resistance  - 1 x daily - 7 x weekly - 3 sets - 10 reps - Long Sitting Ankle Dorsiflexion with Anchored Resistance  - 1 x daily - 7 x weekly - 3 sets - 10 reps - Long Sitting Ankle Plantar Flexion with Resistance  - 1 x daily - 7 x weekly - 3 sets - 10 reps - Forward Monster Walks  - 1 x daily - 7 x weekly - 3 sets - 10 reps - Backward Monster Walks  - 1 x daily - 7 x weekly - 3 sets - 10 reps - Lateral Monster Walk with Squat and Resistance (BKA)  -  1 x daily - 7 x weekly - 3 sets - 10 reps - Mini Squat with Counter Support  - 1 x daily - 7 x weekly - 3 sets - 10 reps     GOALS: Goals reviewed with patient? Yes   SHORT TERM GOALS: Target date: 11/25/2021   Pt will be independent with HEP for improved dynamic and ambulatory balance  Baseline: provided Goal status: MET   2.  Pt will improve FGA to >/= 19/30 to demonstrate improved balance and reduced fall risk   Baseline: 14/30; 26/30 Goal status: MET   3.  Pt  will improve gait speed to >/= .19ms to demonstrate improved community ambulation   Baseline: .223m; .69m58mGoal status: MET     LONG TERM GOALS: Target date: 12/09/2021   Pt will improve FGA to >/= 24/30 to demonstrate improved balance and reduced fall risk Baseline: 14/30 Goal status: INITIAL   2.  Pt will improve gait speed to >/= .8 m/sec to demonstrate improved community ambulation   Baseline: .95m52moal status: INITIAL   3.  Patient will adhere to his back precautions 100% of the time with his HEP Baseline: HEP to be established Goal status: INITIAL  NEW LONG TERM GOALS: 01/15/22  1. Pt will be independent with FINAL HEP for improved balance and functional strength with upgraded precautions Baseline: exercises to be added to HEP once precautions lifted Goal status: INITIAL  2. Patient will be able to lift 25# from the floor with no increase in pain Baseline: unable to do so based on precautions Goal status: INITIAL   3. Patient will  ambulate at least 1900ft41fing 6MWT using LRAD  Baseline: to be assessed   Goal status: INITIAL  4. Patient will improve HiMAT score to >/=22/54  Baseline: 13/54  Goal status: NEW  5. Patient will improve Oswestry score to </= 4 to indicate no disability related to back pain  Baseline: 7  Goal status: NEW      ASSESSMENT:   CLINICAL IMPRESSION: Patient seen for skilled PT session with emphasis on stretching and functional strength. He tolerated progressions of stretches and exercises well with little report of increased pain. Would benefit from increased dynamic balance over uneven surfaces and lifting mechanics. Continue POC.    OBJECTIVE IMPAIRMENTS Abnormal gait, decreased activity tolerance, decreased balance, decreased coordination, decreased endurance, decreased mobility, decreased strength, and impaired sensation.    ACTIVITY LIMITATIONS carrying, lifting, bending, sitting, standing, squatting, stairs, continence,  locomotion level, and caring for others   PARTICIPATION LIMITATIONS: cleaning, driving, shopping, community activity, occupation, and yard work   PERSONAL FACTORS 1 comorbidity: hyperlipidemia  are also affecting patient's functional outcome.    REHAB POTENTIAL: Good   CLINICAL DECISION MAKING: Stable/uncomplicated   EVALUATION COMPLEXITY: Low   PLAN: PT FREQUENCY: 2x/week   PT DURATION: 10 weeks   PLANNED INTERVENTIONS: Therapeutic exercises, Therapeutic activity, Neuromuscular re-education, Balance training, Gait training, Patient/Family education, Joint mobilization, Stair training, DME instructions, Aquatic Therapy, Cryotherapy, Moist heat, scar mobilization, Manual therapy, and Re-evaluation   PLAN FOR NEXT SESSION: elliptical, russian twists, 6MWT, deep squat/lifting, marching drills with weight   JenniDebbora DusJenniDebbora Dus DPT, CBIS  12/22/2021, 9:59 AM

## 2021-12-24 ENCOUNTER — Ambulatory Visit: Payer: BC Managed Care – PPO

## 2021-12-24 DIAGNOSIS — R262 Difficulty in walking, not elsewhere classified: Secondary | ICD-10-CM

## 2021-12-24 DIAGNOSIS — M6281 Muscle weakness (generalized): Secondary | ICD-10-CM

## 2021-12-24 NOTE — Therapy (Signed)
OUTPATIENT PHYSICAL THERAPY TREATMENT NOTE   Patient Name: Cristian Grant [place] MRN: [id number] DOB:Nov 14, 1985, 36 y.o., male Today's Date: 12/24/2021  PCP: Wenda Low, MD REFERRING PROVIDER: Wenda Low, MD  END OF SESSION:   PT End of Session - 12/24/21 0854     Visit Number 9    Number of Visits 17    Date for PT Re-Evaluation 01/15/22    Authorization Type BCBS    PT Start Time [phone]   patient arrived late   PT Stop Time 0930    PT Time Calculation (min) 38 min    Activity Tolerance Patient tolerated treatment well    Behavior During Therapy [facility] for tasks assessed/performed             Past Medical History:  Diagnosis Date   Hyperlipidemia    Past Surgical History:  Procedure Laterality Date   LUMBAR LAMINECTOMY/DECOMPRESSION MICRODISCECTOMY N/A 09/04/2021   Procedure: THORACIC FIVE, THORACIC SIX, THORACIC SEVEN LAMINECTOMY FOR DECOMPRESSION AND  RESECTION OF SYNOVIAL CYST;  Surgeon: Karsten Ro, DO;  Location: [place];  Service: Neurosurgery;  Laterality: N/A;   TEE WITHOUT CARDIOVERSION N/A 09/14/2021   Procedure: TRANSESOPHAGEAL ECHOCARDIOGRAM (TEE);  Surgeon: Acie Fredrickson Wonda Cheng, MD;  Location: [facility] ENDOSCOPY;  Service: Cardiovascular;  Laterality: N/A;   Patient Active Problem List   Diagnosis Date Noted   PICC (peripherally inserted central catheter) in place 10/13/2021   Postoperative pain    Acute blood loss anemia 09/18/2021   Hyponatremia 09/18/2021   Cord compression ([place])    Morbid obesity ([place]) 09/06/2021   Pure hypercholesterolemia 09/06/2021   Staph aureus infection 09/06/2021   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 Asking about returning to biking. PT advised patient to not mtn bike at this time. He denies pain, but reports stiffness.   PAIN:  Are you having pain? No   TODAY'S TREATMENT:  Therex:  -figure 4 stretch B LE -ant ball roll out LB stretch  -bent over rows 3x12 3# weight -I/Y/T 2x12 3# weight    -russian twists 2x20 B    -marching drills over grass with 10# kettlebell: fwd/backward/grapevine/1/4twist + grapevine    -elliptical x4 mins level 1   PATIENT EDUCATION: Education details: continue HEP Person educated: Patient Education method: Customer service manager Education comprehension: verbalized understanding     HOME EXERCISE PROGRAM: Access Code: [email or web address] URL: [email or web address] Date: 11/13/2021 Prepared by: Estevan Ryder  Exercises - Long Sitting Ankle Eversion with Resistance  - 1 x daily - 7 x weekly - 3 sets - 10 reps - Long Sitting Ankle Inversion with Resistance  - 1 x daily - 7 x weekly - 3 sets - 10 reps - Long Sitting Ankle Dorsiflexion with Anchored Resistance  - 1 x daily - 7 x weekly - 3 sets - 10 reps - Long Sitting Ankle Plantar Flexion with Resistance  - 1 x daily - 7 x weekly - 3 sets - 10 reps - Forward Monster Walks  - 1 x daily - 7 x weekly - 3 sets - 10 reps - Backward Monster Walks  - 1 x daily - 7 x weekly - 3 sets - 10 reps -  Lateral Monster Walk with Squat and Resistance (BKA)  - 1 x daily - 7 x weekly - 3 sets - 10 reps - Mini Squat with Counter Support  - 1 x daily - 7 x weekly - 3 sets - 10 reps     GOALS: Goals reviewed with patient? Yes   SHORT TERM GOALS: Target date: 11/25/2021   Pt will be independent with HEP for improved dynamic and ambulatory balance  Baseline: provided Goal status: MET   2.  Pt will improve FGA to >/= 19/30 to demonstrate improved balance and reduced fall risk   Baseline: 14/30; 26/30 Goal status: MET   3.  Pt  will improve gait speed to >/= .14ms to demonstrate improved community ambulation   Baseline: .237m; .33m4mGoal status: MET     LONG TERM GOALS: Target date: 12/09/2021   Pt will improve FGA to >/= 24/30 to demonstrate improved balance and reduced fall risk Baseline: 14/30 Goal status: INITIAL   2.  Pt will improve gait speed to >/= .8 m/sec to demonstrate improved community ambulation   Baseline: .14m33moal status: INITIAL   3.  Patient will adhere to his back precautions 100% of the time with his HEP Baseline: HEP to be established Goal status: INITIAL  NEW LONG TERM GOALS: 01/15/22  1. Pt will be independent with FINAL HEP for improved balance and functional strength with upgraded precautions Baseline: exercises to be added to HEP once precautions lifted Goal status: INITIAL  2. Patient will be able to lift 25# from the floor with no increase in pain Baseline: unable to do so based on precautions Goal status: INITIAL   3. Patient will  ambulate at least 1900ft66fing 6MWT using LRAD  Baseline: to be assessed   Goal status: INITIAL  4. Patient will improve HiMAT score to >/=22/54  Baseline: 13/54  Goal status: NEW  5. Patient will improve Oswestry score to </= 4 to indicate no disability related to back pain  Baseline: 7  Goal status: NEW      ASSESSMENT:   CLINICAL IMPRESSION: Patient seen for skilled PT session with emphasis on stretching and functional strength. Patient tolerating progression of exercises and addition of weight well. He was able to progress to lumbar and low thoracic twists with addition of light weight as well. He continues to report increased muscle fatigue in R LE> L LE. Continue POC.     OBJECTIVE IMPAIRMENTS Abnormal gait, decreased activity tolerance, decreased balance, decreased coordination, decreased endurance, decreased mobility, decreased strength, and impaired sensation.    ACTIVITY LIMITATIONS carrying, lifting, bending, sitting,  standing, squatting, stairs, continence, locomotion level, and caring for others   PARTICIPATION LIMITATIONS: cleaning, driving, shopping, community activity, occupation, and yard work   PERSONAL FACTORS 1 comorbidity: hyperlipidemia  are also affecting patient's functional outcome.    REHAB POTENTIAL: Good   CLINICAL DECISION MAKING: Stable/uncomplicated   EVALUATION COMPLEXITY: Low   PLAN: PT FREQUENCY: 2x/week   PT DURATION: 10 weeks   PLANNED INTERVENTIONS: Therapeutic exercises, Therapeutic activity, Neuromuscular re-education, Balance training, Gait training, Patient/Family education, Joint mobilization, Stair training, DME instructions, Aquatic Therapy, Cryotherapy, Moist heat, scar mobilization, Manual therapy, and Re-evaluation   PLAN FOR NEXT SESSION: elliptical, russian twists, 6MWT, deep squat/lifting, marching drills with weight, new HEP    JenniDebbora DusJenniDebbora Dus DPT, CBIS  12/24/2021, 9:32 AM

## 2021-12-29 ENCOUNTER — Ambulatory Visit: Payer: BC Managed Care – PPO

## 2021-12-29 DIAGNOSIS — M6281 Muscle weakness (generalized): Secondary | ICD-10-CM

## 2021-12-29 DIAGNOSIS — R262 Difficulty in walking, not elsewhere classified: Secondary | ICD-10-CM

## 2021-12-29 NOTE — Therapy (Signed)
OUTPATIENT PHYSICAL THERAPY TREATMENT NOTE   Patient Name: Daril Warga MRN: [id number] DOB:[birth year], 36 y.o., male Today's Date: 12/29/2021  PCP: Wenda Low, MD REFERRING PROVIDER: Wenda Low, MD   Physical Therapy Progress Note   Dates of Reporting Period: 11/11/21-12/18/21  See Note below for Objective Data and Assessment of Progress/Goals.  Thank you for the referral of this patient. Debbora Dus, PT, DPT, CBIS   END OF SESSION:   PT End of Session - 12/29/21 0900     Visit Number 10    Number of Visits 17    Date for PT Re-Evaluation 01/15/22    Authorization Type BCBS    PT Start Time [phone]   patient late   PT Stop Time 0930    PT Time Calculation (min) 33 min    Activity Tolerance Patient tolerated treatment well    Behavior During Therapy WFL for tasks assessed/performed             Past Medical History:  Diagnosis Date   Hyperlipidemia    Past Surgical History:  Procedure Laterality Date   LUMBAR LAMINECTOMY/DECOMPRESSION MICRODISCECTOMY N/A 09/04/2021   Procedure: THORACIC FIVE, THORACIC SIX, THORACIC SEVEN LAMINECTOMY FOR DECOMPRESSION AND  RESECTION OF SYNOVIAL CYST;  Surgeon: Karsten Ro, DO;  Location: [place];  Service: Neurosurgery;  Laterality: N/A;   TEE WITHOUT CARDIOVERSION N/A 09/14/2021   Procedure: TRANSESOPHAGEAL ECHOCARDIOGRAM (TEE);  Surgeon: Acie Fredrickson Wonda Cheng, MD;  [facility] ENDOSCOPY;  Service: Cardiovascular;  Laterality: N/A;   Patient Active Problem List   Diagnosis Date Noted   PICC (peripherally inserted central catheter) in place 10/13/2021   Postoperative pain    Acute blood loss anemia 09/18/2021   Hyponatremia 09/18/2021   Cord compression ([place])    Morbid obesity ([place]) 09/06/2021   Pure hypercholesterolemia 09/06/2021   Staph aureus infection 09/06/2021   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 Went to the gym and used the elliptical and bench press ~25# without issue.   PAIN:  Are you having pain? No   TODAY'S TREATMENT:  Therex:  -ant bal roll out stretch  -elliptical x4 mins level 1.2  [facility] PT Assessment - 12/29/21 0001       6 minute walk test results    Aerobic Endurance Distance Walked 1242      Standardized Balance Assessment   Standardized Balance Assessment 10 meter walk test    10 Meter Walk .762ms           HEP updated and completed (see below)     PATIENT EDUCATION: Education details: new HEP, OM results  Person educated: Patient Education method: Explanation and Demonstration Education comprehension: verbalized understanding     HOME EXERCISE PROGRAM: Access Code: [email or web address]: [email or web address] Date: 12/29/2021 Prepared by: JEstevan Ryder Exercises - Forward Monster Walks  - 1 x daily - 7 x weekly - 3 sets - 10 reps - Backward Monster Walks  - 1 x daily - 7 x weekly - 3 sets - 10 reps - Lateral Monster Walk with Squat and Resistance (BKA)  - 1 x daily - 7 x weekly - 3 sets - 10 reps -  [place] Twists Health and safety inspector, Feet on [employer])   - 1 x daily - 7 x weekly - 3 sets - 10 reps - Squat with Chair Touch and Resistance Loop  - 1 x daily - 7 x weekly - 3 sets - 10 reps - Seated 3 Way Exercise [place] Stretch  - 1 x daily - 7 x weekly - 3 sets - 10 reps     GOALS: Goals reviewed with patient? Yes   SHORT TERM GOALS: Target date: 11/25/2021   Pt will be independent with HEP for improved dynamic and ambulatory balance  Baseline: provided Goal status: MET   2.  Pt will improve FGA to >/= 19/30 to demonstrate improved  balance and reduced fall risk   Baseline: 14/30; 26/30 Goal status: MET   3.  Pt will improve gait speed to >/= .73ms to demonstrate improved community ambulation   Baseline: .226m; .89m37mGoal status: MET     LONG TERM GOALS: Target date: 12/09/2021   Pt will improve FGA to >/= 24/30 to demonstrate improved balance and reduced fall risk Baseline: 14/30 Goal status: MET   2.  Pt will improve gait speed to >/= .8 m/sec to demonstrate improved community ambulation   Baseline: .51m20m.89m/s58mal status: NOT MET   3.  Patient will adhere to his back precautions 100% of the time with his HEP Baseline:  established Goal status: MET  NEW LONG TERM GOALS: 01/15/22  1. Pt will be independent with FINAL HEP for improved balance and functional strength with upgraded precautions Baseline: exercises to be added to HEP once precautions lifted Goal status: INITIAL  2. Patient will be able to lift 25# from the floor with no increase in pain Baseline: unable to do so based on precautions Goal status: INITIAL   3. Patient will  ambulate at least 1900ft 17fng 6MWT using LRAD  Baseline: to be assessed   Goal status: INITIAL  4. Patient will improve HiMAT score to >/=22/54  Baseline: 13/54  Goal status: NEW  5. Patient will improve Oswestry score to </= 4 to indicate no disability related to back pain  Baseline: 7  Goal status: NEW      ASSESSMENT:   CLINICAL IMPRESSION: Patient seen for skilled PT session with emphasis on functional strengthening. He tolerated progression of HEP well. 6 Min Walk Test:  Instructed patient to ambulate as quickly and as safely as possible for 6 minutes using LRAD. Patient was allowed to take standing rest breaks without stopping the test, but if the patient required a sitting rest break the clock would be stopped and the test would be over.  Results: 1242 [id number] Spinal injuries mean: 13189m MD789m8.21 meters (190.98 feet) or 50 meters (ANPTA Core Set of  Outcome Measures for Adults with Neurologic Conditions, 2018). 10 Meter Walk Test: Patient instructed to walk 10 meters (32.8 ft) as quickly and as safely as possible at their normal speed x2 and at a fast speed x2. Time measured from 2 meter mark to 8 meter mark to accommodate ramp-up and ramp-down.  Normal speed: .714m/s C90mff scores: <0.4 m/s = household Ambulator, 0.4-0.8 m/s = limited community Ambulator, >0.8 m/s = community Ambulator, >1.2 m/s = crossing a street, <1.0 = increased fall risk MCID 0.05 m/s (small), 0.13 m/s (moderate), 0.06 m/s (significant)  (ANPTA Core Set of Outcome Measures for Adults with Neurologic Conditions, 2018). Continue POC.    OBJECTIVE IMPAIRMENTS Abnormal gait, decreased activity tolerance, decreased balance, decreased  coordination, decreased endurance, decreased mobility, decreased strength, and impaired sensation.    ACTIVITY LIMITATIONS carrying, lifting, bending, sitting, standing, squatting, stairs, continence, locomotion level, and caring for others   PARTICIPATION LIMITATIONS: cleaning, driving, shopping, community activity, occupation, and yard work   PERSONAL FACTORS 1 comorbidity: hyperlipidemia  are also affecting patient's functional outcome.    REHAB POTENTIAL: Good   CLINICAL DECISION MAKING: Stable/uncomplicated   EVALUATION COMPLEXITY: Low   PLAN: PT FREQUENCY: 2x/week   PT DURATION: 10 weeks   PLANNED INTERVENTIONS: Therapeutic exercises, Therapeutic activity, Neuromuscular re-education, Balance training, Gait training, Patient/Family education, Joint mobilization, Stair training, DME instructions, Aquatic Therapy, Cryotherapy, Moist heat, scar mobilization, Manual therapy, and Re-evaluation   PLAN FOR NEXT SESSION: elliptical, russian twists, deep squat/lifting, marching drills with weight   Debbora Dus, PT Debbora Dus, PT, DPT, CBIS  12/29/2021, 9:33 AM

## 2021-12-31 ENCOUNTER — Ambulatory Visit: Payer: BC Managed Care – PPO

## 2021-12-31 DIAGNOSIS — R262 Difficulty in walking, not elsewhere classified: Secondary | ICD-10-CM | POA: Diagnosis not present

## 2021-12-31 DIAGNOSIS — M6281 Muscle weakness (generalized): Secondary | ICD-10-CM

## 2021-12-31 NOTE — Therapy (Signed)
OUTPATIENT PHYSICAL THERAPY TREATMENT NOTE   Patient Name: Cristian Grant MRN: [id number] DOB:03-22-1986, 36 y.o., male Today's Date: 12/31/2021  PCP: Wenda Low, MD REFERRING PROVIDER: Wenda Low, MD   Physical Therapy Progress Note   Dates of Reporting Period: 11/11/21-12/18/21  See Note below for Objective Data and Assessment of Progress/Goals.  Thank you for the referral of this patient. Debbora Dus, PT, DPT, CBIS   END OF SESSION:   PT End of Session - 12/31/21 0856     Visit Number 11    Number of Visits 17    Date for PT Re-Evaluation 01/15/22    Authorization Type BCBS    PT Start Time [phone]   patient late   PT Stop Time 0930    PT Time Calculation (min) 35 min    Activity Tolerance Patient tolerated treatment well    Behavior During Therapy [facility] for tasks assessed/performed             Past Medical History:  Diagnosis Date   Hyperlipidemia    Past Surgical History:  Procedure Laterality Date   LUMBAR LAMINECTOMY/DECOMPRESSION MICRODISCECTOMY N/A 09/04/2021   Procedure: THORACIC FIVE, THORACIC SIX, THORACIC SEVEN LAMINECTOMY FOR DECOMPRESSION AND  RESECTION OF SYNOVIAL CYST;  Surgeon: Karsten Ro, DO;  Location: [place];  Service: Neurosurgery;  Laterality: N/A;   TEE WITHOUT CARDIOVERSION N/A 09/14/2021   Procedure: TRANSESOPHAGEAL ECHOCARDIOGRAM (TEE);  Surgeon: Acie Fredrickson Wonda Cheng, MD;  Location: 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 Hasn't been back to the  gym since last visit, but did they a physioball for home.   PAIN:  Are you having pain? No   TODAY'S TREATMENT:  Therex:  -ant bal roll out stretch  -elliptical x5 mins level 1.2 -step onto 4" step + shoulder press, opposite leg lift B 2x10 (10# kettle bell)  -staggered sumo squat with 1 LE on 4" box (10# kettle bell) 2x8 B  -birddog on mat 2x5 B  -thread the needle stretch     PATIENT EDUCATION: Education details: continue HEP Person educated: Patient Education method: Customer service manager Education comprehension: verbalized understanding     HOME EXERCISE PROGRAM: Access Code: [email or web address] URL: [email or web address] Date: 12/29/2021 Prepared by: Estevan Ryder  Exercises - Forward Monster Walks  - 1 x daily - 7 x weekly - 3 sets - 10 reps - Backward Monster Walks  - 1 x daily - 7 x weekly - 3 sets - 10 reps - Lateral Monster Walk with Squat and Resistance (BKA)  - 1 x daily - 7 x weekly - 3 sets - 10 reps - [place] Twists (Beginner, Feet on Ground)   - 1 x daily - 7 x weekly - 3 sets - 10 reps - Squat with Chair Touch  and Resistance Loop  - 1 x daily - 7 x weekly - 3 sets - 10 reps - Seated 3 Way Exercise Ball Roll Out Stretch  - 1 x daily - 7 x weekly - 3 sets - 10 reps     GOALS: Goals reviewed with patient? Yes   SHORT TERM GOALS: Target date: 11/25/2021   Pt will be independent with HEP for improved dynamic and ambulatory balance  Baseline: provided Goal status: MET   2.  Pt will improve FGA to >/= 19/30 to demonstrate improved balance and reduced fall risk   Baseline: 14/30; 26/30 Goal status: MET   3.  Pt will improve gait speed  to >/= .61ms to demonstrate improved community ambulation   Baseline: .2461m; .61m161mGoal status: MET     LONG TERM GOALS: Target date: 12/09/2021   Pt will improve FGA to >/= 24/30 to demonstrate improved balance and reduced fall risk Baseline: 14/30 Goal status: MET   2.  Pt will improve gait speed to >/= .8 m/sec to demonstrate improved community ambulation   Baseline: .10m4m.61m/s83mal status: NOT MET   3.  Patient will adhere to his back precautions 100% of the time with his HEP Baseline:  established Goal status: MET  NEW LONG TERM GOALS: 01/15/22  1. Pt will be independent with FINAL HEP for improved balance and functional strength with upgraded precautions Baseline: exercises to be added to HEP once precautions lifted Goal status: INITIAL  2. Patient will be able to lift 25# from the floor with no increase in pain Baseline: unable to do so based on precautions Goal status: INITIAL   3. Patient will  ambulate at least 1900ft 12fng 6MWT using LRAD  Baseline: 1242ft  1f status: INITIAL  4. Patient will improve HiMAT score to >/=22/54  Baseline: 13/54  Goal status: NEW  5. Patient will improve Oswestry score to </= 4 to indicate no disability related to back pain  Baseline: 7  Goal status: NEW      ASSESSMENT:   CLINICAL IMPRESSION: Patient seen for skilled PT session with emphasis on functional LE strengthening. He tolerated progressive exercises well with no increase in pain/strain in back. Continue to report early onset of muscle fatigue in R LE> LLE. Continue POC.    OBJECTIVE IMPAIRMENTS Abnormal gait, decreased activity tolerance, decreased balance, decreased coordination, decreased endurance, decreased mobility, decreased strength, and impaired sensation.    ACTIVITY LIMITATIONS carrying, lifting, bending, sitting, standing, squatting, stairs, continence, locomotion level, and caring for others   PARTICIPATION LIMITATIONS: cleaning, driving, shopping,  community activity, occupation, and yard work   PERSONAL FACTORS 1 comorbidity: hyperlipidemia  are also affecting patient's functional outcome.    REHAB POTENTIAL: Good   CLINICAL DECISION MAKING: Stable/uncomplicated   EVALUATION COMPLEXITY: Low   PLAN: PT FREQUENCY: 2x/week   PT DURATION: 10 weeks   PLANNED INTERVENTIONS: Therapeutic exercises, Therapeutic activity, Neuromuscular re-education, Balance training, Gait training, Patient/Family education, Joint mobilization, Stair training, DME instructions, Aquatic Therapy, Cryotherapy, Moist heat, scar mobilization, Manual therapy, and Re-evaluation   PLAN FOR NEXT SESSION: elliptical, russian twists, deep squat/lifting, marching drills with weight, strengthening to target paraspinals    JennifeDebbora DusnnifeDebbora DusPT, CBIS  12/31/2021, 9:32 AM

## 2022-01-05 ENCOUNTER — Ambulatory Visit: Payer: BC Managed Care – PPO | Attending: Physical Medicine and Rehabilitation

## 2022-01-05 DIAGNOSIS — M6281 Muscle weakness (generalized): Secondary | ICD-10-CM | POA: Diagnosis present

## 2022-01-05 DIAGNOSIS — R262 Difficulty in walking, not elsewhere classified: Secondary | ICD-10-CM | POA: Diagnosis present

## 2022-01-05 NOTE — Therapy (Signed)
OUTPATIENT PHYSICAL THERAPY TREATMENT NOTE   Patient Name: Cristian Grant MRN: [id number] DOB:1985-08-22, 36 y.o., male Today's Date: 01/05/2022  PCP: Wenda Low, MD REFERRING PROVIDER: Wenda Low, MD   END OF SESSION:   PT End of Session - 01/05/22 0852     Visit Number 12    Number of Visits 17    Date for PT Re-Evaluation 01/15/22    Authorization Type BCBS    PT Start Time 0850    PT Stop Time 0928    PT Time Calculation (min) 38 min    Activity Tolerance Patient tolerated treatment well    Behavior During Therapy [facility] for tasks assessed/performed             Past Medical History:  Diagnosis Date   Hyperlipidemia    Past Surgical History:  Procedure Laterality Date   LUMBAR LAMINECTOMY/DECOMPRESSION MICRODISCECTOMY N/A 09/04/2021   Procedure: THORACIC FIVE, THORACIC SIX, THORACIC SEVEN LAMINECTOMY FOR DECOMPRESSION AND  RESECTION OF SYNOVIAL CYST;  Surgeon: Karsten Ro, DO;  Location: [place];  Service: Neurosurgery;  Laterality: N/A;   TEE WITHOUT CARDIOVERSION N/A 09/14/2021   Procedure: TRANSESOPHAGEAL ECHOCARDIOGRAM (TEE);  Surgeon: Acie Fredrickson Wonda Cheng, MD;  Location: [facility] ENDOSCOPY;  Service: Cardiovascular;  Laterality: N/A;   Patient Active Problem List   Diagnosis Date Noted   PICC (peripherally inserted central catheter) in place 10/13/2021   Postoperative pain    Acute blood loss anemia 09/18/2021   Hyponatremia 09/18/2021   Cord compression ([place])    Morbid obesity ([place]) 09/06/2021   Pure hypercholesterolemia 09/06/2021   Staph aureus infection 09/06/2021   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 Increased stiffness after walking around the mall.   PAIN:  Are you having pain? No   TODAY'S TREATMENT:  Therex:  -ant bal roll out stretch  -elliptical x6 mins level 1 -6" step up + shoulder press 12# kettle bell  -step up and over lateral stepping 6" aerobic step -> add 12# kettlebell  -prone I/Y/T  -quadruped thread the needle stretch 2x30s,  -prone on elbows push up plus 2x12 -figure 4 stretching     PATIENT EDUCATION: Education details: continue HEP Person educated: Patient Education method: Customer service manager Education comprehension: verbalized understanding     HOME EXERCISE PROGRAM: Access Code: [email or web address] URL: [email or web address] Date: 12/29/2021 Prepared by: Estevan Ryder  Exercises - Forward Monster Walks  - 1 x daily - 7 x weekly - 3 sets - 10 reps - Backward Monster Walks  - 1 x daily - 7 x weekly - 3 sets - 10 reps - Lateral Monster Walk with Squat and Resistance (BKA)  - 1 x daily - 7 x weekly - 3 sets - 10 reps - [place] Twists (Beginner, Feet on Ground)   - 1 x daily - 7 x weekly - 3 sets - 10 reps - Squat with Chair Touch and Resistance Loop  - 1 x daily - 7 x weekly - 3 sets - 10 reps - Seated 3 Way Exercise Ball Roll Out Stretch  - 1 x daily - 7 x weekly - 3 sets - 10 reps     GOALS: Goals  reviewed with patient? Yes   SHORT TERM GOALS: Target date: 11/25/2021   Pt will be independent with HEP for improved dynamic and ambulatory balance  Baseline: provided Goal status: MET   2.  Pt will improve FGA to >/= 19/30 to demonstrate improved balance and reduced fall risk   Baseline: 14/30; 26/30 Goal status: MET   3.  Pt will improve gait speed to >/= .34ms to demonstrate improved community ambulation   Baseline: .241m; .69m64mGoal status: MET     LONG TERM GOALS: Target date: 12/09/2021   Pt will improve FGA to >/= 24/30 to demonstrate improved balance and reduced fall  risk Baseline: 14/30 Goal status: MET   2.  Pt will improve gait speed to >/= .8 m/sec to demonstrate improved community ambulation   Baseline: .23m27m.69m/s74mal status: NOT MET   3.  Patient will adhere to his back precautions 100% of the time with his HEP Baseline:  established Goal status: MET  NEW LONG TERM GOALS: 01/15/22  1. Pt will be independent with FINAL HEP for improved balance and functional strength with upgraded precautions Baseline: exercises to be added to HEP once precautions lifted Goal status: INITIAL  2. Patient will be able to lift 25# from the floor with no increase in pain Baseline: unable to do so based on precautions Goal status: INITIAL   3. Patient will  ambulate at least 1900ft 80fng 6MWT using LRAD  Baseline: 1242ft  29f status: INITIAL  4. Patient will improve HiMAT score to >/=22/54  Baseline: 13/54  Goal status: NEW  5. Patient will improve Oswestry score to </= 4 to indicate no disability related to back pain  Baseline: 7  Goal status: NEW      ASSESSMENT:   CLINICAL IMPRESSION: Patient seen for skilled PT session with emphasis on functional core strengthening. He continues to report increased fatigue and "heaviness" in his back after exercises. Discussed energy conservation and activity modification techniques. Patient tolerating exercise progressions well, would continue to benefit from upper back strengthening and stretching. Continue POC.    OBJECTIVE IMPAIRMENTS Abnormal gait, decreased activity tolerance, decreased balance, decreased coordination, decreased endurance, decreased mobility, decreased strength, and impaired sensation.    ACTIVITY LIMITATIONS carrying, lifting, bending, sitting, standing, squatting, stairs, continence, locomotion level, and caring for others   PARTICIPATION LIMITATIONS: cleaning, driving, shopping, community activity, occupation, and yard work   PERSONAL FACTORS 1 comorbidity: hyperlipidemia  are  also affecting patient's functional outcome.    REHAB POTENTIAL: Good   CLINICAL DECISION MAKING: Stable/uncomplicated   EVALUATION COMPLEXITY: Low   PLAN: PT FREQUENCY: 2x/week   PT DURATION: 10 weeks   PLANNED INTERVENTIONS: Therapeutic exercises, Therapeutic activity, Neuromuscular re-education, Balance training, Gait training, Patient/Family education, Joint mobilization, Stair training, DME instructions, Aquatic Therapy, Cryotherapy, Moist heat, scar mobilization, Manual therapy, and Re-evaluation   PLAN FOR NEXT SESSION: elliptical, russian twists, deep squat/lifting, marching drills with weight, strengthening to target paraspinals    JennifeDebbora DusnnifeDebbora DusPT, CBIS  01/05/2022, 10:23 AM

## 2022-01-07 ENCOUNTER — Ambulatory Visit: Payer: BC Managed Care – PPO

## 2022-01-07 DIAGNOSIS — R262 Difficulty in walking, not elsewhere classified: Secondary | ICD-10-CM

## 2022-01-07 DIAGNOSIS — M6281 Muscle weakness (generalized): Secondary | ICD-10-CM

## 2022-01-07 NOTE — Therapy (Signed)
OUTPATIENT PHYSICAL THERAPY TREATMENT NOTE   Patient Name: Cristian Grant MRN: [id number] DOB:1986/06/01, 36 y.o., male Today's Date: 01/07/2022  PCP: Wenda Low, MD REFERRING PROVIDER: Wenda Low, MD   END OF SESSION:   PT End of Session - 01/07/22 0850     Visit Number 13    Number of Visits 17    Date for PT Re-Evaluation 01/15/22    Authorization Type BCBS    PT Start Time 0848    PT Stop Time 0926    PT Time Calculation (min) 38 min    Activity Tolerance Patient tolerated treatment well    Behavior During Therapy [facility] for tasks assessed/performed             Past Medical History:  Diagnosis Date   Hyperlipidemia    Past Surgical History:  Procedure Laterality Date   LUMBAR LAMINECTOMY/DECOMPRESSION MICRODISCECTOMY N/A 09/04/2021   Procedure: THORACIC FIVE, THORACIC SIX, THORACIC SEVEN LAMINECTOMY FOR DECOMPRESSION AND  RESECTION OF SYNOVIAL CYST;  Surgeon: Karsten Ro, DO;  Location: [place];  Service: Neurosurgery;  Laterality: N/A;   TEE WITHOUT CARDIOVERSION N/A 09/14/2021   Procedure: TRANSESOPHAGEAL ECHOCARDIOGRAM (TEE);  Surgeon: Acie Fredrickson Wonda Cheng, MD;  Location: [facility] ENDOSCOPY;  Service: Cardiovascular;  Laterality: N/A;   Patient Active Problem List   Diagnosis Date Noted   PICC (peripherally inserted central catheter) in place 10/13/2021   Postoperative pain    Acute blood loss anemia 09/18/2021   Hyponatremia 09/18/2021   Cord compression ([place])    Morbid obesity ([place]) 09/06/2021   Pure hypercholesterolemia 09/06/2021   Staph aureus infection 09/06/2021   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 Went swimming yesterday and that may have helped.   PAIN:  Are you having pain? No   TODAY'S TREATMENT:  Therex:  -ant bal roll out stretch  -elliptical level 1 x6 mins -set up patient with MedBridge GO app for exercises  NMR:  -standing on foam balance beam, trunk rotation with B shoulders abducted 4# dumbells  -standing on foam beam squat + trunk rotation and shoulder press 4# dumbell  -birddog with U UE on dynadisc 2x8  -4-square stepping -> resisted with green theraband      PATIENT EDUCATION: Education details: continue HEP Person educated: Patient Education method: Explanation and Demonstration Education comprehension: verbalized understanding     HOME EXERCISE PROGRAM: Access Code: [email or web address] URL: [email or web address] Date: 12/29/2021 Prepared by: Estevan Ryder  Exercises - Forward Monster Walks  - 1 x daily - 7 x weekly - 3 sets - 10 reps - Backward Monster Walks  - 1 x daily - 7 x weekly - 3 sets - 10 reps - Lateral Monster Walk with Squat and Resistance (BKA)  - 1 x daily - 7 x weekly - 3 sets - 10 reps - [place] Twists (Beginner, Feet on Ground)   - 1 x daily - 7 x weekly - 3 sets - 10 reps - Squat with Chair Touch and Resistance Loop  - 1 x daily - 7 x weekly - 3 sets - 10 reps - Seated 3 Way Exercise [place] Stretch  - 1 x daily -  7 x weekly - 3 sets - 10 reps     GOALS: Goals reviewed with patient? Yes   SHORT TERM GOALS: Target date: 11/25/2021   Pt will be independent with HEP for improved dynamic and ambulatory balance  Baseline: provided Goal status: MET   2.  Pt will improve FGA to >/= 19/30 to demonstrate improved balance and reduced fall risk   Baseline: 14/30; 26/30 Goal status: MET   3.  Pt will improve gait speed to >/= .3854ms to demonstrate improved community ambulation   Baseline: .246m; .854m53mGoal status: MET     LONG TERM GOALS: Target date: 12/09/2021    Pt will improve FGA to >/= 24/30 to demonstrate improved balance and reduced fall risk Baseline: 14/30 Goal status: MET   2.  Pt will improve gait speed to >/= .8 m/sec to demonstrate improved community ambulation   Baseline: .41m38m.854m/s4854mal status: NOT MET   3.  Patient will adhere to his back precautions 100% of the time with his HEP Baseline:  established Goal status: MET  NEW LONG TERM GOALS: 01/15/22  1. Pt will be independent with FINAL HEP for improved balance and functional strength with upgraded precautions Baseline: exercises to be added to HEP once precautions lifted Goal status: INITIAL  2. Patient will be able to lift 25# from the floor with no increase in pain Baseline: unable to do so based on precautions Goal status: INITIAL   3. Patient will  ambulate at least 1900ft 72fng 6MWT using LRAD  Baseline: 1242ft  35f status: INITIAL  4. Patient will improve HiMAT score to >/=22/54  Baseline: 13/54  Goal status: NEW  5. Patient will improve Oswestry score to </= 4 to indicate no disability related to back pain  Baseline: 7  Goal status: NEW      ASSESSMENT:   CLINICAL IMPRESSION: Patient seen for skilled PT session with emphasis on core stability and exercises. Patient tolerating progressions well with reports of improving endurance, though his mid back paraspinals remain with reduced endurance compared to other worked muscles. Continue POC.    OBJECTIVE IMPAIRMENTS Abnormal gait, decreased activity tolerance, decreased balance, decreased coordination, decreased endurance, decreased mobility, decreased strength, and impaired sensation.    ACTIVITY LIMITATIONS carrying, lifting, bending, sitting, standing, squatting, stairs, continence, locomotion level, and caring for others   PARTICIPATION LIMITATIONS: cleaning, driving, shopping, community activity, occupation, and yard work   PERSONAL FACTORS 1 comorbidity: hyperlipidemia  are also affecting  patient's functional outcome.    REHAB POTENTIAL: Good   CLINICAL DECISION MAKING: Stable/uncomplicated   EVALUATION COMPLEXITY: Low   PLAN: PT FREQUENCY: 2x/week   PT DURATION: 10 weeks   PLANNED INTERVENTIONS: Therapeutic exercises, Therapeutic activity, Neuromuscular re-education, Balance training, Gait training, Patient/Family education, Joint mobilization, Stair training, DME instructions, Aquatic Therapy, Cryotherapy, Moist heat, scar mobilization, Manual therapy, and Re-evaluation   PLAN FOR NEXT SESSION: elliptical, russian twists, deep squat/lifting, marching drills with weight, strengthening to target paraspinals    JennifeDebbora DusnnifeDebbora DusPT, CBIS  01/07/2022, 9:28 AM

## 2022-01-12 ENCOUNTER — Ambulatory Visit: Payer: BC Managed Care – PPO

## 2022-01-12 DIAGNOSIS — M6281 Muscle weakness (generalized): Secondary | ICD-10-CM

## 2022-01-12 DIAGNOSIS — R262 Difficulty in walking, not elsewhere classified: Secondary | ICD-10-CM

## 2022-01-12 NOTE — Therapy (Signed)
OUTPATIENT PHYSICAL THERAPY TREATMENT NOTE   Patient Name: Cristian Grant MRN: [id number] DOB:02/02/1986, 36 y.o., male Today's Date: 01/12/2022  PCP: Wenda Low, MD REFERRING PROVIDER: Wenda Low, MD   END OF SESSION:   PT End of Session - 01/12/22 0852     Visit Number 14    Number of Visits 17    Date for PT Re-Evaluation 01/15/22    Authorization Type BCBS    PT Start Time [phone]    PT Stop Time 0930    PT Time Calculation (min) 41 min    Activity Tolerance Patient tolerated treatment well    Behavior During Therapy [facility] for tasks assessed/performed             Past Medical History:  Diagnosis Date   Hyperlipidemia    Past Surgical History:  Procedure Laterality Date   LUMBAR LAMINECTOMY/DECOMPRESSION MICRODISCECTOMY N/A 09/04/2021   Procedure: THORACIC FIVE, THORACIC SIX, THORACIC SEVEN LAMINECTOMY FOR DECOMPRESSION AND  RESECTION OF SYNOVIAL CYST;  Surgeon: Karsten Ro, DO;  Location: [place];  Service: Neurosurgery;  Laterality: N/A;   TEE WITHOUT CARDIOVERSION N/A 09/14/2021   Procedure: TRANSESOPHAGEAL ECHOCARDIOGRAM (TEE);  Surgeon: Acie Fredrickson Wonda Cheng, MD;  Location: [facility] ENDOSCOPY;  Service: Cardiovascular;  Laterality: N/A;   Patient Active Problem List   Diagnosis Date Noted   PICC (peripherally inserted central catheter) in place 10/13/2021   Postoperative pain    Acute blood loss anemia 09/18/2021   Hyponatremia 09/18/2021   Cord compression ([place])    Morbid obesity ([place]) 09/06/2021   Pure hypercholesterolemia 09/06/2021   Staph aureus infection 09/06/2021   Epidural abscess 09/06/2021   Thoracic myelopathy 09/04/2021    REFERRING DIAG: G95.20 (ICD-10-CM) - Cord compression ([place])  THERAPY DIAG:  Difficulty in walking, not elsewhere classified  Muscle weakness (generalized)  Rationale for Evaluation and Treatment Rehabilitation  PERTINENT HISTORY: 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 Feels as though the "circle" on his back is getting smaller regarding the numbness around the incision site. Stiffness is the same.  PAIN:  Are you having pain? Yes: NPRS scale: 2/10 Pain location: R side of incision Pain description: sharp pain- like a trigger point Aggravating factors: moving  Relieving factors: tylenol at night    TODAY'S TREATMENT:  Therex:  -ant bal roll out stretch  -elliptical level 1 x7 mins  NMR: -started HiMAT: 13/54 so far  -Oswestry: 7 (mild disability) -cross friction massage     PATIENT EDUCATION: Education details: OM results, continue HEP, cross friction massage  Person educated: Patient Education method: Explanation and Demonstration Education comprehension: verbalized understanding     HOME EXERCISE PROGRAM: Access Code: [email or web address] URL: [email or web address] Date: 12/29/2021 Prepared by: Estevan Ryder  Exercises - Forward Monster Walks  - 1 x daily - 7 x weekly - 3 sets - 10 reps - Backward Monster Walks  - 1 x daily - 7 x weekly - 3 sets - 10 reps - Lateral Monster Walk with Squat and Resistance (BKA)  - 1 x daily - 7 x weekly - 3 sets - 10 reps - [place] Twists (Beginner, Feet on Ground)   - 1 x daily - 7 x weekly - 3 sets - 10 reps - Squat with Chair Touch and Resistance Loop  - 1 x daily - 7 x weekly - 3 sets - 10 reps - Seated 3 Way Exercise [place] Stretch  - 1 x daily - 7 x weekly -  3 sets - 10 reps     GOALS: Goals reviewed with patient? Yes   SHORT TERM GOALS: Target date: 11/25/2021   Pt will be independent with HEP for improved dynamic and ambulatory balance  Baseline: provided Goal status: MET   2.  Pt will improve FGA to >/= 19/30 to demonstrate improved balance and reduced fall risk   Baseline: 14/30; 26/30 Goal status: MET   3.  Pt will improve gait speed to >/= .102ms to demonstrate improved community ambulation   Baseline: .289m; .44m48mGoal status: MET     LONG TERM GOALS:  Target date: 12/09/2021   Pt will improve FGA to >/= 24/30 to demonstrate improved balance and reduced fall risk Baseline: 14/30 Goal status: MET   2.  Pt will improve gait speed to >/= .8 m/sec to demonstrate improved community ambulation   Baseline: .96m68m.44m/s70mal status: NOT MET   3.  Patient will adhere to his back precautions 100% of the time with his HEP Baseline:  established Goal status: MET  NEW LONG TERM GOALS: 01/15/22  1. Pt will be independent with FINAL HEP for improved balance and functional strength with upgraded precautions Baseline: exercises to be added to HEP once precautions lifted Goal status: INITIAL  2. Patient will be able to lift 25# from the floor with no increase in pain Baseline: unable to do so based on precautions Goal status: INITIAL   3. Patient will  ambulate at least 1900ft 87fng 6MWT using LRAD  Baseline: 1242ft  64f status: INITIAL  4. Patient will improve HiMAT score to >/=22/54  Baseline: 13/54  Goal status: NEW  5. Patient will improve Oswestry score to </= 4 to indicate no disability related to back pain  Baseline: 7  Goal status: NEW      ASSESSMENT:   CLINICAL IMPRESSION: Patient seen for skilled PT session with emphasis on functional strengthening and beginning of goal assessment. He scored a 13/54 with 5 unscored sections (to be assessed at next visit) indicating that his score has improved since initial assessment- improvement in high level balance. Patient continues to report pressure/trigger point -like pain to R lateral side of incision. PT performing and providing patient with handout of cross friction massage to be completed at home. Patient scoring a 7 again on the Oswestry, which indicated mild disability due to back pain. Continue POC.     OBJECTIVE IMPAIRMENTS Abnormal gait, decreased activity tolerance, decreased balance, decreased coordination, decreased endurance, decreased mobility, decreased strength, and  impaired sensation.    ACTIVITY LIMITATIONS carrying, lifting, bending, sitting, standing, squatting, stairs, continence, locomotion level, and caring for others   PARTICIPATION LIMITATIONS: cleaning, driving, shopping, community activity, occupation, and yard work   PERSONAL FACTORS 1 comorbidity: hyperlipidemia  are also affecting patient's functional outcome.    REHAB POTENTIAL: Good   CLINICAL DECISION MAKING: Stable/uncomplicated   EVALUATION COMPLEXITY: Low   PLAN: PT FREQUENCY: 2x/week   PT DURATION: 10 weeks   PLANNED INTERVENTIONS: Therapeutic exercises, Therapeutic activity, Neuromuscular re-education, Balance training, Gait training, Patient/Family education, Joint mobilization, Stair training, DME instructions, Aquatic Therapy, Cryotherapy, Moist heat, scar mobilization, Manual therapy, and Re-evaluation   PLAN FOR NEXT SESSION: elliptical, russian twists, deep squat/lifting, marching drills with weight, strengthening to target paraspinals, finish HiMAT/goal assessment    JennifeDebbora DusnnifeDebbora DusPT, CBIS  01/12/2022, 10:28 AM

## 2022-01-14 ENCOUNTER — Ambulatory Visit: Payer: BC Managed Care – PPO

## 2022-01-14 DIAGNOSIS — R262 Difficulty in walking, not elsewhere classified: Secondary | ICD-10-CM

## 2022-01-14 DIAGNOSIS — M6281 Muscle weakness (generalized): Secondary | ICD-10-CM

## 2022-01-14 NOTE — Therapy (Signed)
OUTPATIENT PHYSICAL THERAPY TREATMENT NOTE   Patient Name: Cristian Grant MRN: [id number] DOB:1986-03-06, 36 y.o., male Today's Date: 01/14/2022  PCP: Wenda Low, MD REFERRING PROVIDER: Wenda Low, MD  PHYSICAL THERAPY DISCHARGE SUMMARY  Visits from Start of Care: 15  Current functional level related to goals / functional outcomes: ModI with no AD. Much improved high level balance and agility, as well as functional strength and endurance   Remaining deficits: Slight decrease in endurance compared to age matched norms.    Education / Equipment: HEP, PT POC, OM results, exercise progressions   Patient agrees to discharge. Patient goals were partially met. Patient is being discharged due to being pleased with the current functional level.  END OF SESSION:   PT End of Session - 01/14/22 0942     Visit Number 15    Number of Visits 17    Date for PT Re-Evaluation 01/15/22    Authorization Type BCBS    PT Start Time 0940   patient late   PT Stop Time 1012    PT Time Calculation (min) 32 min    Activity Tolerance Patient tolerated treatment well    Behavior During Therapy [facility] for tasks assessed/performed             Past Medical History:  Diagnosis Date   Hyperlipidemia    Past Surgical History:  Procedure Laterality Date   LUMBAR LAMINECTOMY/DECOMPRESSION MICRODISCECTOMY N/A 09/04/2021   Procedure: THORACIC FIVE, THORACIC SIX, THORACIC SEVEN LAMINECTOMY FOR DECOMPRESSION AND  RESECTION OF SYNOVIAL CYST;  Surgeon: Karsten Ro, DO;  Location: [place];  Service: Neurosurgery;  Laterality: N/A;   TEE WITHOUT CARDIOVERSION N/A 09/14/2021   Procedure: TRANSESOPHAGEAL ECHOCARDIOGRAM (TEE);  Surgeon: Acie Fredrickson Wonda Cheng, MD;  Location: [facility] ENDOSCOPY;  Service: Cardiovascular;  Laterality: N/A;   Patient Active Problem List   Diagnosis Date Noted   PICC (peripherally inserted central catheter) in place 10/13/2021   Postoperative pain    Acute blood loss anemia  09/18/2021   Hyponatremia 09/18/2021   Cord compression ([place])    Morbid obesity ([place]) 09/06/2021   Pure hypercholesterolemia 09/06/2021   Staph aureus infection 09/06/2021   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 Went swimming yesterday with no issue. Continues to have stiffness moreso in the AM, but relieves with mild mobility.   PAIN:  Are you having pain? No   TODAY'S TREATMENT:  Therex:  -ant bal roll out stretch  -elliptical level 1.2 x7 mins  NMR: -HiMAT: 20/54  [facility] PT Assessment - 01/14/22 0001       6 minute walk test results    Aerobic Endurance Distance Walked 1540                PATIENT EDUCATION: Education details: OM results Person educated: Patient Education method: Customer service manager Education comprehension: verbalized understanding     HOME EXERCISE PROGRAM: Access Code: [email or web address] URL: [email or web address] Date: 12/29/2021 Prepared by: Estevan Ryder  Exercises - Forward Monster Walks  - 1 x daily - 7 x weekly - 3 sets - 10 reps - Backward Monster Walks  - 1 x daily - 7 x weekly - 3 sets - 10 reps - Lateral Monster Walk with  Squat and Resistance (BKA)  - 1 x daily - 7 x weekly - 3 sets - 10 reps - [place] Twists (Beginner, Feet on Ground)   - 1 x daily - 7 x weekly - 3 sets - 10 reps - Squat with Chair Touch and Resistance Loop  - 1 x daily - 7 x weekly - 3 sets - 10 reps - Seated 3 Way Exercise Ball Roll Out Stretch  - 1 x daily - 7 x weekly - 3 sets - 10 reps     GOALS: Goals reviewed with patient? Yes   SHORT TERM GOALS: Target date: 11/25/2021   Pt will  be independent with HEP for improved dynamic and ambulatory balance  Baseline: provided Goal status: MET   2.  Pt will improve FGA to >/= 19/30 to demonstrate improved balance and reduced fall risk   Baseline: 14/30; 26/30 Goal status: MET   3.  Pt will improve gait speed to >/= .27ms to demonstrate improved community ambulation   Baseline: .261m; .48m23mGoal status: MET     LONG TERM GOALS: Target date: 12/09/2021   Pt will improve FGA to >/= 24/30 to demonstrate improved balance and reduced fall risk Baseline: 14/30 Goal status: MET   2.  Pt will improve gait speed to >/= .8 m/sec to demonstrate improved community ambulation   Baseline: .73m19m.48m/s39mal status: NOT MET   3.  Patient will adhere to his back precautions 100% of the time with his HEP Baseline:  established Goal status: MET  NEW LONG TERM GOALS: 01/15/22  1. Pt will be independent with FINAL HEP for improved balance and functional strength with upgraded precautions Baseline: provided Goal status: MET  2. Patient will be able to lift 25# from the floor with no increase in pain Baseline: unable to do so based on precautions; 25# no pain  Goal status: MET   3. Patient will  ambulate at least 1900ft 97fng 6MWT using LRAD  Baseline: 1242ft; 14fft no22f Goal status: NOT MET  4. Patient will improve HiMAT score to >/=22/54  Baseline: 13/54; 20/54  Goal status: NOT MET  5. Patient will improve Oswestry score to </= 4 to indicate no disability related to back pain  Baseline: 7; 4  Goal status: MET      ASSESSMENT:   CLINICAL IMPRESSION: Patient seen for skilled PT session with emphasis on goal assessment and discharge. 6 Min Walk Test:  Instructed patient to ambulate as quickly and as safely as possible for 6 minutes using LRAD. Patient was allowed to take standing rest breaks without stopping the test, but if the patient required a sitting rest break the clock would be stopped and the test would be  over.  Results: 1540 feet . Results indicate that the patient has reduced endurance with ambulation compared to age matched norms, but this is a significant improvement from previous assessments. Age Matched Norms: 60-69 yo3-6972 F: 514 [id number] F: 454 80-89 yo31-8917 F: 392 MDC: 58.21 meters (190.98 feet) or 50 meters (ANPTA Core Set of Outcome Measures for Adults with Neurologic Conditions, 2018). He scored a 20/54on the HiMAT indicating decreased high level agility and balance, but this is also a significant improvement from previous assessment. This OM norms are also based on those with a TBI, so the results are relative. Patient to be discharged from PT at this time.     OBJECTIVE IMPAIRMENTS Abnormal gait, decreased activity tolerance, decreased  balance, decreased coordination, decreased endurance, decreased mobility, decreased strength, and impaired sensation.    ACTIVITY LIMITATIONS carrying, lifting, bending, sitting, standing, squatting, stairs, continence, locomotion level, and caring for others   PARTICIPATION LIMITATIONS: cleaning, driving, shopping, community activity, occupation, and yard work   PERSONAL FACTORS 1 comorbidity: hyperlipidemia  are also affecting patient's functional outcome.    REHAB POTENTIAL: Good   CLINICAL DECISION MAKING: Stable/uncomplicated   EVALUATION COMPLEXITY: Low   PLAN: PT FREQUENCY: 2x/week   PT DURATION: 10 weeks   PLANNED INTERVENTIONS: Therapeutic exercises, Therapeutic activity, Neuromuscular re-education, Balance training, Gait training, Patient/Family education, Joint mobilization, Stair training, DME instructions, Aquatic Therapy, Cryotherapy, Moist heat, scar mobilization, Manual therapy, and Re-evaluation   PLAN FOR NEXT SESSION: patient discharged from [place], PT Debbora Dus, PT, DPT, CBIS  01/14/2022, 10:16 AM

## 2022-01-28 ENCOUNTER — Ambulatory Visit: Payer: BC Managed Care – PPO | Admitting: Internal Medicine

## 2022-01-28 NOTE — Progress Notes (Deleted)
[facility]  CHIEF COMPLAINT:    Follow up for epidural abscess  SUBJECTIVE:    Cristian Grant is a 36 y.o. male with PMHx as below who presents to the clinic for epidural abscess.   Patient here today for 3 month follow up.   Patient was admitted to NSGY service at [facility] from 3/31 through 4/11 after presenting with paraplegia and cord compression due to presumed epidural lipomatosis.  He was taken to the OR 3/31 where purulence was unexpectedly encountered.  This was cultured and grew MSSA.  Blood cultures and TEE were negative.     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  No indications for repeat debridement per NSGY.   He had a PICC line placed and OPAT orders to receive Cefazolin through 5/12.  He spent a few days in rehab following hospital discharge where he continued to progress and was subsequently discharged home.  He followed up with myself on 10/13/21 and reported that he continued to progress with his recovery and pain was improved.  He is regaining more function daily.  He has followed up with NSGY as recently as July. He completed his IV antibiotics and PICC line subsequently removed.  He was received Cefadroxil 1gm BID for 2 more weeks to complete 8 total weeks of antibiotics.  He had repeat MRI on 5/20 and this was reassuring for no evidence of active spinal infection.    Over the past 3 months he has had no signs or symptoms worrisome for relapsed infection.  He is doing well with rehab.  He is back to work.      Please see A&P for the details of today's visit and status of the patient's medical problems.   Patient's Medications  New Prescriptions   No medications on file   Previous Medications   ACETAMINOPHEN (TYLENOL) 325 MG TABLET    Take 2 tablets (650 mg total) by mouth 3 (three) times daily.   MULTIPLE VITAMIN (MULTIVITAMIN WITH MINERALS) TABS TABLET    Take 1 tablet by mouth daily.   ROSUVASTATIN (CRESTOR) 10 MG TABLET    Take 1 tablet (10 mg total) by mouth daily.   VITAMIN D, ERGOCALCIFEROL, (DRISDOL) 1.25 MG (50000 UNIT) CAPS CAPSULE    Take 1 capsule (50,000 Units total) by mouth every 7 (seven) days.  Modified Medications   No medications on file  Discontinued Medications   No medications on file      Past Medical History:  Diagnosis Date   Hyperlipidemia     Social History   Tobacco Use   Smoking status: Never   Smokeless tobacco: Never  Vaping Use   Vaping Use: Never used  Substance Use Topics   Alcohol use: Yes   Drug use: Not Currently    Family History  Problem Relation Age of Onset   Diabetes Mother    Crohn's disease Mother    Crohn's disease Sister     No Known Allergies  ROS   OBJECTIVE:    There were no vitals filed for this visit. There is no height or weight on file to calculate BMI.  Physical Exam   Labs and Microbiology:    Latest Ref Rng &  Units 09/16/2021    4:36 AM 09/14/2021    7:35 AM 09/09/2021    5:23 AM  CBC  WBC 4.0 - 10.5 K/uL 8.6  8.3  12.4   Hemoglobin 13.0 - 17.0 g/dL 70.9  62.8  36.6   Hematocrit 39.0 - 52.0 % 36.0  37.9  37.8   Platelets 150 - 400 K/uL 406  419  456       Latest Ref Rng & Units 09/18/2021    4:06 AM 09/16/2021    4:36 AM 09/14/2021    7:35 AM  CMP  Glucose 70 - 99 mg/dL 294  98  99   BUN 6 - 20 mg/dL 11  13  12    Creatinine 0.61 - 1.24 mg/dL  7.65  4.65   Sodium 135 - 145 mmol/L 133  135  135   Potassium 3.5 - 5.1 mmol/L 3.9  4.4  4.2   Chloride 98 - 111 mmol/L 103  104  103   CO2 22 - 32 mmol/L 25  24  24    Calcium 8.9 - 10.3 mg/dL 8.5  9.0  9.2   Total Protein 6.5 - 8.1 g/dL 7.2  7.6    Total Bilirubin 0.3 - 1.2 mg/dL 0.5  0.7    Alkaline Phos 38 - 126  U/L 72  70    AST 15 - 41 U/L 27  29    ALT 0 - 44 U/L 35  46        ASSESSMENT & PLAN:    No problem-specific Assessment & Plan notes found for this encounter.   No orders of the defined types were placed in this encounter.    There are no diagnoses linked to this encounter.  He continues to do well after a prolonged course of antibiotics for MSSA infection complicated by epidural abscess.  No further antibiotics indicated and follow up as needed.    0.35 for Infectious Disease [facility] 01/28/2022, 12:18 PM
